# Patient Record
Sex: Female | Born: 1955 | Race: White | Hispanic: No | Marital: Married | Smoking: Former smoker
Health system: Southern US, Community
[De-identification: ages and names within clinical notes are randomized; demographics above are authoritative.]

## PROBLEM LIST (undated history)

## (undated) DIAGNOSIS — G35 Multiple sclerosis: Secondary | ICD-10-CM

## (undated) DIAGNOSIS — N201 Calculus of ureter: Secondary | ICD-10-CM

## (undated) DIAGNOSIS — B953 Streptococcus pneumoniae as the cause of diseases classified elsewhere: Secondary | ICD-10-CM

## (undated) DIAGNOSIS — M810 Age-related osteoporosis without current pathological fracture: Secondary | ICD-10-CM

## (undated) DIAGNOSIS — K219 Gastro-esophageal reflux disease without esophagitis: Secondary | ICD-10-CM

## (undated) DIAGNOSIS — J189 Pneumonia, unspecified organism: Secondary | ICD-10-CM

## (undated) DIAGNOSIS — G8194 Hemiplegia, unspecified affecting left nondominant side: Secondary | ICD-10-CM

## (undated) DIAGNOSIS — M1990 Unspecified osteoarthritis, unspecified site: Secondary | ICD-10-CM

## (undated) DIAGNOSIS — R319 Hematuria, unspecified: Secondary | ICD-10-CM

## (undated) DIAGNOSIS — M5416 Radiculopathy, lumbar region: Secondary | ICD-10-CM

## (undated) DIAGNOSIS — E785 Hyperlipidemia, unspecified: Secondary | ICD-10-CM

## (undated) DIAGNOSIS — I272 Pulmonary hypertension, unspecified: Secondary | ICD-10-CM

## (undated) DIAGNOSIS — H539 Unspecified visual disturbance: Secondary | ICD-10-CM

## (undated) DIAGNOSIS — G4730 Sleep apnea, unspecified: Secondary | ICD-10-CM

## (undated) DIAGNOSIS — F32A Depression, unspecified: Secondary | ICD-10-CM

## (undated) DIAGNOSIS — J449 Chronic obstructive pulmonary disease, unspecified: Secondary | ICD-10-CM

## (undated) DIAGNOSIS — E039 Hypothyroidism, unspecified: Secondary | ICD-10-CM

## (undated) DIAGNOSIS — F329 Major depressive disorder, single episode, unspecified: Secondary | ICD-10-CM

## (undated) DIAGNOSIS — L409 Psoriasis, unspecified: Secondary | ICD-10-CM

## (undated) DIAGNOSIS — E119 Type 2 diabetes mellitus without complications: Secondary | ICD-10-CM

## (undated) DIAGNOSIS — F419 Anxiety disorder, unspecified: Secondary | ICD-10-CM

## (undated) DIAGNOSIS — I10 Essential (primary) hypertension: Secondary | ICD-10-CM

## (undated) DIAGNOSIS — N6019 Diffuse cystic mastopathy of unspecified breast: Secondary | ICD-10-CM

## (undated) HISTORY — PX: JOINT REPLACEMENT: SHX530

## (undated) HISTORY — PX: CARDIAC CATHETERIZATION: SHX172

## (undated) HISTORY — PX: KNEE ARTHROSCOPY: SHX127

## (undated) HISTORY — PX: COLONOSCOPY: SHX174

## (undated) HISTORY — DX: Multiple sclerosis: G35

## (undated) HISTORY — DX: Unspecified osteoarthritis, unspecified site: M19.90

## (undated) HISTORY — DX: Unspecified visual disturbance: H53.9

---
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
## 1978-11-24 DIAGNOSIS — G35 Multiple sclerosis: Secondary | ICD-10-CM

## 1978-11-24 HISTORY — DX: Multiple sclerosis: G35

## 2004-08-28 ENCOUNTER — Encounter: Payer: Self-pay | Admitting: Neurology

## 2004-10-24 ENCOUNTER — Encounter: Payer: Self-pay | Admitting: Neurology

## 2005-02-18 ENCOUNTER — Ambulatory Visit: Payer: Self-pay | Admitting: Internal Medicine

## 2005-06-25 ENCOUNTER — Ambulatory Visit: Payer: Self-pay

## 2005-07-01 ENCOUNTER — Ambulatory Visit: Payer: Self-pay

## 2006-03-12 ENCOUNTER — Ambulatory Visit: Payer: Self-pay | Admitting: Internal Medicine

## 2006-10-08 ENCOUNTER — Ambulatory Visit: Payer: Self-pay | Admitting: Gastroenterology

## 2006-12-08 ENCOUNTER — Encounter: Payer: Self-pay | Admitting: Psychiatry

## 2006-12-25 ENCOUNTER — Encounter: Payer: Self-pay | Admitting: Psychiatry

## 2007-01-23 ENCOUNTER — Encounter: Payer: Self-pay | Admitting: Psychiatry

## 2007-03-16 ENCOUNTER — Encounter: Payer: Self-pay | Admitting: Unknown Physician Specialty

## 2007-03-17 ENCOUNTER — Ambulatory Visit: Payer: Self-pay | Admitting: Internal Medicine

## 2007-03-25 ENCOUNTER — Encounter: Payer: Self-pay | Admitting: Unknown Physician Specialty

## 2007-11-25 HISTORY — PX: REPLACEMENT TOTAL KNEE: SUR1224

## 2008-06-08 ENCOUNTER — Ambulatory Visit: Payer: Self-pay | Admitting: Internal Medicine

## 2008-06-21 ENCOUNTER — Ambulatory Visit: Payer: Self-pay | Admitting: Unknown Physician Specialty

## 2008-06-28 ENCOUNTER — Inpatient Hospital Stay: Payer: Self-pay | Admitting: Unknown Physician Specialty

## 2008-08-22 ENCOUNTER — Encounter: Payer: Self-pay | Admitting: Unknown Physician Specialty

## 2008-08-24 ENCOUNTER — Encounter: Payer: Self-pay | Admitting: Unknown Physician Specialty

## 2008-09-24 ENCOUNTER — Encounter: Payer: Self-pay | Admitting: Unknown Physician Specialty

## 2008-10-24 ENCOUNTER — Encounter: Payer: Self-pay | Admitting: Unknown Physician Specialty

## 2009-07-19 ENCOUNTER — Ambulatory Visit: Payer: Self-pay | Admitting: Internal Medicine

## 2010-01-29 ENCOUNTER — Encounter: Payer: Self-pay | Admitting: Internal Medicine

## 2010-02-22 ENCOUNTER — Encounter: Payer: Self-pay | Admitting: Internal Medicine

## 2010-08-26 ENCOUNTER — Ambulatory Visit: Payer: Self-pay | Admitting: Internal Medicine

## 2011-05-13 ENCOUNTER — Ambulatory Visit: Payer: Self-pay | Admitting: Neurology

## 2011-08-28 ENCOUNTER — Ambulatory Visit: Payer: Self-pay | Admitting: Internal Medicine

## 2012-08-24 ENCOUNTER — Ambulatory Visit: Payer: Self-pay | Admitting: Neurology

## 2012-09-27 ENCOUNTER — Ambulatory Visit: Payer: Self-pay | Admitting: Internal Medicine

## 2013-09-28 ENCOUNTER — Ambulatory Visit: Payer: Self-pay | Admitting: Internal Medicine

## 2014-10-02 ENCOUNTER — Ambulatory Visit: Payer: Self-pay | Admitting: Internal Medicine

## 2015-01-02 DIAGNOSIS — F329 Major depressive disorder, single episode, unspecified: Secondary | ICD-10-CM | POA: Insufficient documentation

## 2015-01-02 DIAGNOSIS — F419 Anxiety disorder, unspecified: Secondary | ICD-10-CM | POA: Insufficient documentation

## 2015-01-02 DIAGNOSIS — F17200 Nicotine dependence, unspecified, uncomplicated: Secondary | ICD-10-CM | POA: Insufficient documentation

## 2015-01-02 DIAGNOSIS — F418 Other specified anxiety disorders: Secondary | ICD-10-CM | POA: Insufficient documentation

## 2015-01-02 DIAGNOSIS — Z9109 Other allergy status, other than to drugs and biological substances: Secondary | ICD-10-CM | POA: Insufficient documentation

## 2015-01-02 DIAGNOSIS — E669 Obesity, unspecified: Secondary | ICD-10-CM | POA: Insufficient documentation

## 2015-02-27 DIAGNOSIS — I10 Essential (primary) hypertension: Secondary | ICD-10-CM | POA: Insufficient documentation

## 2015-03-02 ENCOUNTER — Ambulatory Visit
Admit: 2015-03-02 | Disposition: A | Discharge status: Home / Self Care | Payer: Self-pay | Attending: Internal Medicine | Admitting: Internal Medicine

## 2015-08-24 ENCOUNTER — Other Ambulatory Visit: Payer: Self-pay | Admitting: Internal Medicine

## 2015-08-24 ENCOUNTER — Other Ambulatory Visit: Payer: Self-pay | Admitting: *Deleted

## 2015-08-24 DIAGNOSIS — Z1231 Encounter for screening mammogram for malignant neoplasm of breast: Secondary | ICD-10-CM

## 2015-10-04 ENCOUNTER — Other Ambulatory Visit: Payer: Self-pay | Admitting: Internal Medicine

## 2015-10-04 ENCOUNTER — Ambulatory Visit
Admission: RE | Admit: 2015-10-04 | Discharge: 2015-10-04 | Disposition: A | Discharge status: Home / Self Care | Payer: Medicare Other | Source: Ambulatory Visit | Attending: Internal Medicine | Admitting: Internal Medicine

## 2015-10-04 DIAGNOSIS — Z1231 Encounter for screening mammogram for malignant neoplasm of breast: Secondary | ICD-10-CM | POA: Insufficient documentation

## 2016-01-15 ENCOUNTER — Ambulatory Visit: Payer: Medicare Other | Attending: Neurology

## 2016-01-15 DIAGNOSIS — G4733 Obstructive sleep apnea (adult) (pediatric): Secondary | ICD-10-CM | POA: Diagnosis not present

## 2016-05-01 ENCOUNTER — Other Ambulatory Visit: Payer: Self-pay | Admitting: Internal Medicine

## 2016-05-01 ENCOUNTER — Ambulatory Visit: Payer: Medicare Other | Admitting: Physical Therapy

## 2016-05-01 DIAGNOSIS — E119 Type 2 diabetes mellitus without complications: Secondary | ICD-10-CM | POA: Insufficient documentation

## 2016-05-01 DIAGNOSIS — G35 Multiple sclerosis: Secondary | ICD-10-CM

## 2016-05-06 ENCOUNTER — Encounter: Payer: Medicare Other | Admitting: Physical Therapy

## 2016-05-06 ENCOUNTER — Encounter: Payer: Self-pay | Admitting: Physical Therapy

## 2016-05-06 ENCOUNTER — Ambulatory Visit: Payer: Medicare Other | Attending: Orthopedic Surgery | Admitting: Physical Therapy

## 2016-05-06 DIAGNOSIS — M25511 Pain in right shoulder: Secondary | ICD-10-CM

## 2016-05-06 DIAGNOSIS — R262 Difficulty in walking, not elsewhere classified: Secondary | ICD-10-CM | POA: Insufficient documentation

## 2016-05-06 DIAGNOSIS — M6281 Muscle weakness (generalized): Secondary | ICD-10-CM | POA: Diagnosis present

## 2016-05-06 DIAGNOSIS — G35 Multiple sclerosis: Secondary | ICD-10-CM | POA: Insufficient documentation
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
## 2016-05-08 ENCOUNTER — Ambulatory Visit: Payer: Medicare Other | Admitting: Physical Therapy

## 2016-05-08 DIAGNOSIS — M25511 Pain in right shoulder: Secondary | ICD-10-CM | POA: Diagnosis not present

## 2016-05-08 DIAGNOSIS — M6281 Muscle weakness (generalized): Secondary | ICD-10-CM
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
## 2016-05-13 ENCOUNTER — Ambulatory Visit: Payer: Medicare Other

## 2016-05-13 ENCOUNTER — Encounter: Payer: Self-pay | Admitting: Physical Therapy

## 2016-05-13 DIAGNOSIS — M25511 Pain in right shoulder: Secondary | ICD-10-CM

## 2016-05-13 DIAGNOSIS — M6281 Muscle weakness (generalized): Secondary | ICD-10-CM
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
## 2016-05-15 ENCOUNTER — Ambulatory Visit: Payer: Medicare Other

## 2016-05-15 DIAGNOSIS — M25511 Pain in right shoulder: Secondary | ICD-10-CM | POA: Diagnosis not present

## 2016-05-15 DIAGNOSIS — M6281 Muscle weakness (generalized): Secondary | ICD-10-CM
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
## 2016-05-20 ENCOUNTER — Ambulatory Visit: Payer: Medicare Other | Admitting: Physical Therapy

## 2016-05-22 ENCOUNTER — Ambulatory Visit: Payer: Medicare Other | Admitting: Physical Therapy

## 2016-05-22 DIAGNOSIS — M25511 Pain in right shoulder: Secondary | ICD-10-CM
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
## 2016-06-03 ENCOUNTER — Ambulatory Visit
Admission: RE | Admit: 2016-06-03 | Discharge: 2016-06-03 | Disposition: A | Discharge status: Home / Self Care | Payer: Medicare Other | Source: Ambulatory Visit | Attending: Internal Medicine | Admitting: Internal Medicine

## 2016-06-03 DIAGNOSIS — G35 Multiple sclerosis: Secondary | ICD-10-CM

## 2016-06-03 MED ORDER — GADOBENATE DIMEGLUMINE 529 MG/ML IV SOLN
20.0000 mL | Freq: Once | INTRAVENOUS | Status: AC | PRN
  Administered 2016-06-03: 19 mL via INTRAVENOUS

## 2016-07-13 ENCOUNTER — Emergency Department: Payer: Medicare Other

## 2016-07-13 ENCOUNTER — Encounter: Payer: Self-pay | Admitting: Emergency Medicine

## 2016-07-13 ENCOUNTER — Emergency Department
Admission: EM | Admit: 2016-07-13 | Discharge: 2016-07-13 | Disposition: A | Discharge status: Home / Self Care | Payer: Medicare Other | Attending: Emergency Medicine | Admitting: Emergency Medicine

## 2016-07-13 DIAGNOSIS — Y939 Activity, unspecified: Secondary | ICD-10-CM | POA: Insufficient documentation

## 2016-07-13 DIAGNOSIS — S2232XA Fracture of one rib, left side, initial encounter for closed fracture: Secondary | ICD-10-CM | POA: Diagnosis not present

## 2016-07-13 DIAGNOSIS — Y999 Unspecified external cause status: Secondary | ICD-10-CM | POA: Insufficient documentation

## 2016-07-13 DIAGNOSIS — F17210 Nicotine dependence, cigarettes, uncomplicated: Secondary | ICD-10-CM | POA: Diagnosis not present

## 2016-07-13 DIAGNOSIS — Y929 Unspecified place or not applicable: Secondary | ICD-10-CM | POA: Insufficient documentation

## 2016-07-13 DIAGNOSIS — S299XXA Unspecified injury of thorax, initial encounter: Secondary | ICD-10-CM | POA: Diagnosis present

## 2016-07-13 DIAGNOSIS — W1839XA Other fall on same level, initial encounter: Secondary | ICD-10-CM | POA: Diagnosis not present

## 2016-07-13 HISTORY — DX: Age-related osteoporosis without current pathological fracture: M81.0

## 2016-07-13 NOTE — ED Triage Notes (Signed)
Patient has history of MS and Friday evening patient fell onto metal bar to left ribs / left upper abd and then onto knees.  Patient c/o left rib and side pain with movement and left knee pain.

## 2016-07-13 NOTE — Discharge Instructions (Signed)
Follow-up with Dr. [name] if any continued problems. Begin taking your medication at home as directed

## 2016-07-13 NOTE — ED Provider Notes (Signed)
[facility] Emergency Department Provider Note   ____________________________________________   First MD Initiated Contact with Patient 07/13/16 [phone]     (approximate)  I have reviewed the triage vital signs and the nursing notes.   HISTORY  Chief Complaint Fall   HPI Ashlee Fields is a 60 y.o. female is here with complaint of left rib pain after losing her balance and falling one day ago.Patient states she has a history of MS and lost her balance which she does frequently and hit a metal bar with her left ribs. Patient then went down on her knees however she is not complaining of any knee pain at this time. Patient has continued to ambulate. She states that she took baclofen for spasms and had one Tylenol with codeine left over from a dental procedure. Patient states the Tylenol with codeine did not help with her pain. Today she continues to have pain in the same area. This pain is increased with certain movements or coughing. She denies any head injury or loss of consciousness. She denies any back pain.   Past Medical History:  Diagnosis Date  . Arthritis   . Multiple sclerosis ([place]) 1980  . Osteoporosis     Patient Active Problem List   Diagnosis Date Noted  . MS (multiple sclerosis) ([place]) 05/06/2016    Past Surgical History:  Procedure Laterality Date  . CESAREAN SECTION    . JOINT REPLACEMENT      Prior to Admission medications   Medication Sig Start Date End Date Taking? Authorizing Provider  amLODipine (NORVASC) 5 MG tablet Take 5 mg by mouth daily.    Historical Provider, MD  baclofen (LIORESAL) 20 MG tablet Take 20 mg by mouth 3 (three) times daily.    Historical Provider, MD  benazepril-hydrochlorthiazide (LOTENSIN HCT) 20-25 MG tablet Take 1 tablet by mouth daily.    Historical Provider, MD  FLUoxetine (PROZAC) 40 MG capsule Take 40 mg by mouth daily.    Historical Provider, MD  levothyroxine (SYNTHROID, LEVOTHROID) 50 MCG tablet  Take 50 mcg by mouth daily before breakfast.    Historical Provider, MD  pantoprazole (PROTONIX) 40 MG tablet Take 40 mg by mouth daily.    Historical Provider, MD    Allergies Penicillins and Sulfa antibiotics  Family History  Problem Relation Age of Onset  . Breast cancer Paternal Grandmother     Social History Social History  Substance Use Topics  . Smoking status: Current Every Day Smoker    Packs/day: 0.50    Types: Cigarettes  . Smokeless tobacco: Never Used  . Alcohol use Not on file    Review of Systems Constitutional: No fever/chills Eyes: No visual changes. ENT: No trauma Cardiovascular: Denies chest pain. Respiratory: Denies shortness of breath. Increased rib pain with deep breaths. Gastrointestinal: No abdominal pain.  No nausea, no vomiting.   Genitourinary: Negative for dysuria. Musculoskeletal: Negative for back pain. Positive left lateral rib pain. Skin: Negative for rash. Neurological: Negative for headaches, focal weakness or numbness.  10-point ROS otherwise negative.  ____________________________________________   PHYSICAL EXAM:  VITAL SIGNS: ED Triage Vitals  Enc Vitals Group     BP 07/13/16 0930 126/64     Pulse Rate 07/13/16 0930 72     Resp 07/13/16 0930 16     Temp 07/13/16 0930 97.8 F (36.6 C)     Temp Source 07/13/16 0930 Oral     SpO2 07/13/16 0930 94 %     Weight 07/13/16 0930  200 lb (90.7 kg)     Height 07/13/16 0930 _0  (1.6 m)     Head Circumference --      Peak Flow --      Pain Score 07/13/16 0937 0     Pain Loc --      Pain Edu? --      Excl. in Center? --     Constitutional: Alert and oriented. Well appearing and in no acute distress. Eyes: Conjunctivae are normal. PERRL. EOMI. Head: Atraumatic. Nose: No congestion/rhinnorhea. Mouth/Throat: Mucous membranes are moist.  Oropharynx non-erythematous. Neck: No stridor.  Cardiovascular: Normal rate, regular rhythm. Grossly normal heart sounds.  Good peripheral  circulation. Respiratory: Normal respiratory effort.  No retractions. Lungs CTAB. No deformities noted on the left lateral rib area or soft tissue swelling. No ecchymosis was noted. No crepitus. Gastrointestinal: Soft. Left lateral soft tissue tenderness without edema. No distention. No CVA tenderness. Musculoskeletal: No lower extremity tenderness nor edema.  No joint effusions. Neurologic:  Normal speech and language. No gross focal neurologic deficits are appreciated. No gait instability. Skin:  Skin is warm, dry and intact. No rash noted. Psychiatric: Mood and affect are normal. Speech and behavior are normal.  ____________________________________________   LABS (all labs ordered are listed, but only abnormal results are displayed)  Labs Reviewed - No data to display  RADIOLOGY  Left rib x-ray per radiologist Minimal displaced fracture of the left tenth rib. No pneumothorax.  I, [name], personally viewed and evaluated these images (plain radiographs) as part of my medical decision making, as well as reviewing the written report by the radiologist. ____________________________________________   PROCEDURES  Procedure(s) performed: None  Procedures  Critical Care performed: No  ____________________________________________   INITIAL IMPRESSION / ASSESSMENT AND PLAN / ED COURSE  Pertinent labs & imaging results that were available during my care of the patient were reviewed by me and considered in my medical decision making (see chart for details).    Clinical Course   She was made aware of her fractured rib. Patient states that narcotics generally do not give her any pain relief and that she does better with prednisone 10 mg daily as she takes for her MS. She will notify her PCP that she is once again taking prednisone. Patient states she has medication at home already. Patient is follow-up with her PCP if any continued  problems.  ____________________________________________   FINAL CLINICAL IMPRESSION(S) / ED DIAGNOSES  Final diagnoses:  Rib fracture, left, closed, initial encounter      NEW MEDICATIONS STARTED DURING THIS VISIT:  Discharge Medication List as of 07/13/2016 11:02 AM       Note:  This document was prepared using Dragon voice recognition software and may include unintentional dictation errors.    [name], PA-C 07/13/16 1617    Earleen Newport, MD 07/14/16 [phone]

## 2016-08-06 ENCOUNTER — Other Ambulatory Visit: Payer: Self-pay | Admitting: Internal Medicine

## 2016-08-06 DIAGNOSIS — Z1231 Encounter for screening mammogram for malignant neoplasm of breast: Secondary | ICD-10-CM

## 2016-10-07 ENCOUNTER — Encounter: Payer: Self-pay | Admitting: Neurology

## 2016-10-07 ENCOUNTER — Encounter: Payer: Self-pay | Admitting: *Deleted

## 2016-10-07 ENCOUNTER — Ambulatory Visit (INDEPENDENT_AMBULATORY_CARE_PROVIDER_SITE_OTHER): Payer: Medicare Other | Admitting: Neurology

## 2016-10-07 VITALS — BP 124/72 | HR 70 | Resp 18 | Ht 63.0 in | Wt 209.5 lb

## 2016-10-07 DIAGNOSIS — R5383 Other fatigue: Secondary | ICD-10-CM | POA: Diagnosis not present

## 2016-10-07 DIAGNOSIS — G35 Multiple sclerosis: Secondary | ICD-10-CM | POA: Diagnosis not present

## 2016-10-07 DIAGNOSIS — J449 Chronic obstructive pulmonary disease, unspecified: Secondary | ICD-10-CM | POA: Insufficient documentation

## 2016-10-07 DIAGNOSIS — E669 Obesity, unspecified: Secondary | ICD-10-CM | POA: Insufficient documentation

## 2016-10-07 DIAGNOSIS — F32A Depression, unspecified: Secondary | ICD-10-CM

## 2016-10-07 DIAGNOSIS — F329 Major depressive disorder, single episode, unspecified: Secondary | ICD-10-CM

## 2016-10-07 DIAGNOSIS — R269 Unspecified abnormalities of gait and mobility: Secondary | ICD-10-CM

## 2016-10-07 DIAGNOSIS — E785 Hyperlipidemia, unspecified: Secondary | ICD-10-CM | POA: Insufficient documentation

## 2016-10-07 DIAGNOSIS — Z79899 Other long term (current) drug therapy: Secondary | ICD-10-CM

## 2016-10-07 DIAGNOSIS — N6019 Diffuse cystic mastopathy of unspecified breast: Secondary | ICD-10-CM | POA: Insufficient documentation

## 2016-10-07 DIAGNOSIS — G8194 Hemiplegia, unspecified affecting left nondominant side: Secondary | ICD-10-CM

## 2016-10-07 DIAGNOSIS — M1990 Unspecified osteoarthritis, unspecified site: Secondary | ICD-10-CM | POA: Insufficient documentation

## 2016-10-07 DIAGNOSIS — J441 Chronic obstructive pulmonary disease with (acute) exacerbation: Secondary | ICD-10-CM | POA: Insufficient documentation

## 2016-10-07 MED ORDER — METHYLPHENIDATE HCL 10 MG PO TABS: 10.0000 mg | ORAL_TABLET | Freq: Two times a day (BID) | ORAL | 0 refills | Status: DC

## 2016-10-07 NOTE — Progress Notes (Signed)
[facility]  PATIENT: Ashlee Fields DOB: 1956/07/11  REFERRING DOCTOR OR PCP:  Ashlee Fields (PCP) and Ashlee Fields (Neuro) SOURCE: Patient, notes from Ashlee Fields, Imaging/lab reports, sleep study reports, MRI images on PACS personally reviewed  _________________________________   HISTORICAL  CHIEF COMPLAINT:  Chief Complaint  Patient presents with  . Multiple Sclerosis    Ashlee Fields is here with her husband Ashlee Fields to discuss tx. options for MS.  Sts. she was dx. in 1980.  Presenting sx. was numbness bilat arms/hands, left leg numbness. Sts. dx. confirmed with MRI and LP.  She saw Ashlee Fields in [place].  She started Avonex but stopped 10 yrs. later due to inj. site fatigue and progression of sx.  She then tried Betaseron and Copaxone but stopped those due to progression of sx., inj. site fatigue.  She then tried Tecfidera but stopped due to side effects (flushing),  Sts. at   . Gait Disturbance    that time, Ashlee Fields discussed the possibility that her MS is progressive.  She has left sided weakness, gait disturbance.  Ampyra did not help.  She is here today to discuss Ocrelizumab/fim    HISTORY OF PRESENT ILLNESS:  I had the pleasure seeing you patient, Ashlee Fields, at [facility] for neurologic consultation regarding her multiple sclerosis and to get an opinion about disease modifying therapies.  MS history: About 40 years ago, she had tenderness in both hands that lasted for about a week. Her symptoms then in improved. Later, after an exercise workout, she had the onset of left leg numbness that persisted for about a week. Both times she got 100% better.   She was diagnosed with MS in 1980 after having MRI and lumbar puncture. She was not on any treatment for many years. In the mid 1990s she started Avonex. At different times she was also tried on Betaseron and Copaxone. Due to combinations of progression of her symptoms as well as injection site  fatigue, she was placed on Tecfidera a few years ago. That was stopped due to flushing as a side effect. She then has been off any medicines for the past couple of years.  Ampyra was tried for her gait but did not help.  Gait/strength/sensation: Currently, she has difficulty with her walking due to spasticity in the left greater than right leg, mild weakness and poor balance. She uses a cane or her husband for balance. She notes that there is weakness that is worse at the ankle on the left and she has an AFO. She does not note any asymmetry in her sensation.    She notes that her spasticity is a little improved with baclofen and she takes it mostly at that time. She also finds that her spasticity improves with steroids though she has been on oral pills for a long time.  Vision: Over the past year she has noted more difficulty with blurry vision. This appears to be bilateral although the left eye seems worse than the right eye. Denies any double vision. Color vision is symmetric.  Bladder: She has urinary urgency and some frequency. If she cannot get to the bathroom in time she may have incontinence.  Fatigue/sleep: She reports both physical and cognitive fatigue. She finds the most benefit from prednisone and she is currently on 10 mg daily. A couple attempts have been made to taper the prednisone but she felt much worse with her fatigue and was reinstated. In the past she has tried Provigil  and Nuvigil but had problems with her heart racing. Sudafed can help her fatigue little bit. She falls asleep and stays asleep well. She takes Valium and an baclofen at bedtime which helps her insomnia. She also treat obstructive sleep apnea with CPAP. She has had restless legs which is helped by the Valium.  Mood/cognition: She has had some mild mood disturbances and is on Prozac with benefit. She denies any major cognitive problems that does have some decreased focus and attention at times.  I personally reviewed  the MRIs of the brain dated 06/03/2016 and 08/24/2012 and the MRI of the cervical spine dated 08/24/2012. The MRI of the cervical spine shows an anterior lesion adjacent to C6 consistent with MS. Additionally, on the axial T2-weighted images of the brain there appears to be a focus at the cervicomedullary junction. The MRI of the brain shows multiple T2/FLAIR hyperintense foci in the hemispheres with MS though many are nonspecific. During the 4 years between the 2 MRI studies there is one more T2/FLAIR hyperintense focus in the brain.  REVIEW OF SYSTEMS: Constitutional: No fevers, chills, sweats, or change in appetite Eyes: No visual changes, double vision, eye pain Ear, nose and throat: No hearing loss, ear pain, nasal congestion, sore throat Cardiovascular: No chest pain, palpitations Respiratory: No shortness of breath at rest or with exertion.   No wheezes GastrointestinaI: No nausea, vomiting, diarrhea, abdominal pain, fecal incontinence Genitourinary: No dysuria, urinary retention or frequency.  No nocturia. Musculoskeletal: No neck pain, back pain Integumentary: No rash, pruritus, skin lesions Neurological: as above Psychiatric: No depression at this time.  No anxiety Endocrine: No palpitations, diaphoresis, change in appetite, change in weigh or increased thirst Hematologic/Lymphatic: No anemia, purpura, petechiae. Allergic/Immunologic: No itchy/runny eyes, nasal congestion, recent allergic reactions, rashes  ALLERGIES: Allergies  Allergen Reactions  . Penicillins   . Sulfa Antibiotics     HOME MEDICATIONS:  Current Outpatient Prescriptions:  .  amLODipine (NORVASC) 5 MG tablet, Take 5 mg by mouth daily., Disp: , Rfl:  .  baclofen (LIORESAL) 20 MG tablet, Take 20 mg by mouth 3 (three) times daily., Disp: , Rfl:  .  levothyroxine (SYNTHROID, LEVOTHROID) 50 MCG tablet, Take 50 mcg by mouth daily before breakfast., Disp: , Rfl:  .  losartan-hydrochlorothiazide (HYZAAR)  100-12.5 MG tablet, Take 1 tablet by mouth daily., Disp: , Rfl:  .  pantoprazole (PROTONIX) 40 MG tablet, Take 40 mg by mouth daily., Disp: , Rfl:   PAST MEDICAL HISTORY: Past Medical History:  Diagnosis Date  . Arthritis   . Multiple sclerosis ([place]) 1980  . Osteoporosis   . Vision abnormalities     PAST SURGICAL HISTORY: Past Surgical History:  Procedure Laterality Date  . CESAREAN SECTION    . JOINT REPLACEMENT      FAMILY HISTORY: Family History  Problem Relation Age of Onset  . Breast cancer Paternal Grandmother   . Heart disease Mother   . Kidney failure Father     SOCIAL HISTORY:  Social History   Social History  . Marital status: Married    Spouse name: N/A  . Number of children: N/A  . Years of education: N/A   Occupational History  . Not on file.   Social History Main Topics  . Smoking status: Current Every Day Smoker    Packs/day: 0.50    Types: Cigarettes  . Smokeless tobacco: Never Used  . Alcohol use Not on file  . Drug use: Unknown  . Sexual activity: Not on  file   Other Topics Concern  . Not on file   Social History Narrative  . No narrative on file     PHYSICAL EXAM  Vitals:   10/07/16 1519  BP: 124/72  Fields: 70  Resp: 18  Weight: 209 lb 8 oz (95 kg)  Height: _0  (1.6 m)    Body mass index is 37.11 kg/m.   General: The patient is well-developed and well-nourished and in no acute distress  Eyes:  Funduscopic exam shows normal optic discs and retinal vessels.  Neck: The neck is supple, no carotid bruits are noted.  The neck is nontender.  Cardiovascular: The heart has a regular rate and rhythm with a normal S1 and S2. There were no murmurs, gallops or rubs. Lungs are clear to auscultation.  Skin: Extremities are without significant edema.  Musculoskeletal:  Back is nontender  Neurologic Exam  Mental status: The patient is alert and oriented x 3 at the time of the examination. The patient has apparent normal recent  and remote memory, with an apparently normal attention span and concentration ability.   Speech is normal.  Cranial nerves: Extraocular movements are full. Pupils are equal, round, and reactive to light and accomodation.  Visual fields are full.  Facial symmetry is present. There is good facial sensation to soft touch bilaterally.Facial strength is normal.  Trapezius and sternocleidomastoid strength is normal. No dysarthria is noted.  The tongue is midline, and the patient has symmetric elevation of the soft palate. No obvious hearing deficits are noted.  Motor:  Muscle bulk is normal.   Tone is increased on the left arm and leg. Strength is 4+ to 5/5 in the left arm and 4-/5 in the left  iliopsoas 4/5 in the left quads and 3/5 at the left ankle  Sensory: Sensory testing is intact to pinprick, soft touch and vibration sensation in all 4 extremities.  Coordination: Cerebellar testing reveals good finger-nose-finger and heel-to-shin bilaterally.  Gait and station: Station is normal.   Gait is spastic with a left foot drop (wears AFO). Stride is shortened. Romberg is negative.   Reflexes: Deep tendon reflexes are increased in the left arm and leg. There is clonus at the ankle on the left. Plantar response is extensor on the left     DIAGNOSTIC DATA (LABS, IMAGING, TESTING) - I reviewed patient records, labs, notes, testing and imaging myself where available.       ASSESSMENT AND PLAN  *MS (multiple sclerosis) ([place]) - Plan: Hepatitis B surface antigen, Hepatitis B core antibody, total, CBC with Differential/Platelet, Quantiferon tb gold assay (blood), Hepatitis B surface antibody, Hepatic function panel  Gait disturbance  Other fatigue  Left hemiplegia (HCC)  Depression, unspecified depression type  High risk medication use - Plan: Hepatitis B surface antigen, Hepatitis B core antibody, total, Quantiferon tb gold assay (blood), Hepatitis B surface antibody   1.   Ashlee Fields has  relapsing remitting multiple sclerosis and more recently has had progression of older symptoms (left hemiplegia) and onset of newer symptoms (vision changes) over the last year.    She went off of disease modifying therapies a couple years ago but would like to reconsider starting an agent. We discussed ocrelizumab and went over the risks and benefits/  we will check labwork to rule out hepatitis B and latent tuberculosis.   We discussed that I would her her to try to taper the prednisone or further testing if she goes on ocrelizumab. 2.  One of  her major symptoms is fatigue and additionally she has OSA but still has daytime sleepiness. I will have her try Ritalin 10 mg by mouth every morning and she can take a second tablet 3-4 hours later.  Hopefully this will help her fatigue and that might allow her to get off of the prednisone 3.   Her main problem is spasticity on the left side of her body. Marland Kitchen  She will continue baclofen for her spasticity and other medications.  4.   She will return to see me in a couple of months but call sooner if there are new or worsening neurologic symptoms.  Thank you for asking me to see Ashlee Fields for a neurologic consultation. Please let me know if I can be of further assistance with her or other patients in the future.   Ashlee Defeo A. Felecia Shelling, MD, PhD 08/67/6195, 0:93 PM Certified in Neurology, Clinical Neurophysiology, Sleep Medicine, Pain Medicine and Neuroimaging  [facility] [address] [place], [place] [address] [phone]

## 2016-10-08 LAB — HEPATITIS B CORE ANTIBODY, TOTAL: Hep B Core Total Ab: NEGATIVE

## 2016-10-08 LAB — CBC WITH DIFFERENTIAL/PLATELET
Basophils Absolute: 0 10*3/uL (ref 0.0–0.2)
Basos: 0 %
EOS (ABSOLUTE): 0 10*3/uL (ref 0.0–0.4)
Eos: 0 %
Hematocrit: 38.8 % (ref 34.0–46.6)
Hemoglobin: 13.3 g/dL (ref 11.1–15.9)
Immature Grans (Abs): 0 10*3/uL (ref 0.0–0.1)
Immature Granulocytes: 0 %
Lymphocytes Absolute: 1.1 10*3/uL (ref 0.7–3.1)
Lymphs: 10 %
MCH: 30.4 pg (ref 26.6–33.0)
MCHC: 34.3 g/dL (ref 31.5–35.7)
MCV: 89 fL (ref 79–97)
Monocytes Absolute: 0.5 10*3/uL (ref 0.1–0.9)
Monocytes: 4 %
Neutrophils Absolute: 9.3 10*3/uL — ABNORMAL HIGH (ref 1.4–7.0)
Neutrophils: 86 %
Platelets: 340 10*3/uL (ref 150–379)
RBC: 4.37 x10E6/uL (ref 3.77–5.28)
RDW: 16 % — ABNORMAL HIGH (ref 12.3–15.4)
WBC: 11 10*3/uL — ABNORMAL HIGH (ref 3.4–10.8)

## 2016-10-08 LAB — HEPATIC FUNCTION PANEL
ALT: 31 IU/L (ref 0–32)
AST: 21 IU/L (ref 0–40)
Albumin: 4.7 g/dL (ref 3.6–4.8)
Alkaline Phosphatase: 88 IU/L (ref 39–117)
Bilirubin Total: 0.6 mg/dL (ref 0.0–1.2)
Bilirubin, Direct: 0.17 mg/dL (ref 0.00–0.40)
Total Protein: 7 g/dL (ref 6.0–8.5)

## 2016-10-08 LAB — HEPATITIS B SURFACE ANTIGEN: Hepatitis B Surface Ag: NEGATIVE

## 2016-10-08 LAB — HEPATITIS B SURFACE ANTIBODY,QUALITATIVE: Hep B Surface Ab, Qual: NONREACTIVE

## 2016-10-09 ENCOUNTER — Ambulatory Visit
Admission: RE | Admit: 2016-10-09 | Discharge: 2016-10-09 | Disposition: A | Discharge status: Home / Self Care | Payer: Medicare Other | Source: Ambulatory Visit | Attending: Internal Medicine | Admitting: Internal Medicine

## 2016-10-09 DIAGNOSIS — Z1231 Encounter for screening mammogram for malignant neoplasm of breast: Secondary | ICD-10-CM

## 2016-10-09 LAB — QUANTIFERON IN TUBE
QFT TB AG MINUS NIL VALUE: 0 IU/mL
QUANTIFERON MITOGEN VALUE: 6.27 IU/mL
QUANTIFERON TB AG VALUE: 0.05 IU/mL
QUANTIFERON TB GOLD: NEGATIVE
Quantiferon Nil Value: 0.05 IU/mL

## 2016-10-09 LAB — QUANTIFERON TB GOLD ASSAY (BLOOD)

## 2016-10-13 ENCOUNTER — Telehealth: Payer: Self-pay | Admitting: *Deleted

## 2016-10-13 NOTE — Telephone Encounter (Signed)
-----  Message from Britt Bottom, MD sent at 10/10/2016  9:01 AM EST ----- Her lab work looks good. We can send in the ocrelizumab form.

## 2016-10-13 NOTE — Telephone Encounter (Signed)
Noted/fim

## 2016-10-13 NOTE — Telephone Encounter (Signed)
I have spoken with Ashlee Fields this afternoon and per RAS, advised that labs are ok for Ocrelizumab.  She verbalized understanding of same, sts. her ins. is changing 10-24-16.  As she will not be able to be infused prior to 12-1, will hold off on sending srf in until we receive new ins. info. Also, she  sts she forgot to mention to RAS that she has a hx. of severe psoriasis, now cleared up with course of meds.  Wants to make sure it is still ok to start Ocrelizumab.  I will check with him tomorrow when he returns to the office and call pt. back.  Also, pt. sts. Ritalin 50m once daily does not  help with fatigue.  As RAS gave rx. for #60, one bid, I have advised her to repeat Ritalin 173mat lunch.  She verbalized understanding of same./fim

## 2016-10-13 NOTE — Telephone Encounter (Signed)
Pt called to speak with RN. She advised Rn needed insurance phone #:  Deatra Canter [phone]  ID# [id number]

## 2016-10-14 NOTE — Telephone Encounter (Signed)
I have spoken with Jaclyn Shaggy this morning, and per RAS, advised it is ok for her to start Ocrelizumab, with hx. of psoriasis/fim

## 2016-10-31 DIAGNOSIS — G4733 Obstructive sleep apnea (adult) (pediatric): Secondary | ICD-10-CM | POA: Diagnosis not present

## 2016-11-25 DIAGNOSIS — H538 Other visual disturbances: Secondary | ICD-10-CM | POA: Diagnosis not present

## 2016-11-25 DIAGNOSIS — E7800 Pure hypercholesterolemia, unspecified: Secondary | ICD-10-CM | POA: Diagnosis not present

## 2016-11-25 DIAGNOSIS — I10 Essential (primary) hypertension: Secondary | ICD-10-CM | POA: Diagnosis not present

## 2016-11-25 DIAGNOSIS — H539 Unspecified visual disturbance: Secondary | ICD-10-CM | POA: Diagnosis not present

## 2016-11-25 DIAGNOSIS — E039 Hypothyroidism, unspecified: Secondary | ICD-10-CM | POA: Diagnosis not present

## 2016-11-25 DIAGNOSIS — R0602 Shortness of breath: Secondary | ICD-10-CM | POA: Diagnosis not present

## 2016-11-25 DIAGNOSIS — G4733 Obstructive sleep apnea (adult) (pediatric): Secondary | ICD-10-CM | POA: Diagnosis not present

## 2016-11-25 DIAGNOSIS — G35 Multiple sclerosis: Secondary | ICD-10-CM | POA: Diagnosis not present

## 2016-11-25 DIAGNOSIS — E119 Type 2 diabetes mellitus without complications: Secondary | ICD-10-CM | POA: Diagnosis not present

## 2016-11-25 DIAGNOSIS — Z79899 Other long term (current) drug therapy: Secondary | ICD-10-CM | POA: Diagnosis not present

## 2016-11-25 DIAGNOSIS — R110 Nausea: Secondary | ICD-10-CM | POA: Diagnosis not present

## 2016-11-25 DIAGNOSIS — Z1211 Encounter for screening for malignant neoplasm of colon: Secondary | ICD-10-CM | POA: Diagnosis not present

## 2016-11-25 DIAGNOSIS — J431 Panlobular emphysema: Secondary | ICD-10-CM | POA: Diagnosis not present

## 2016-12-01 DIAGNOSIS — G4733 Obstructive sleep apnea (adult) (pediatric): Secondary | ICD-10-CM | POA: Diagnosis not present

## 2016-12-16 DIAGNOSIS — G35 Multiple sclerosis: Secondary | ICD-10-CM | POA: Diagnosis not present

## 2016-12-30 DIAGNOSIS — G35 Multiple sclerosis: Secondary | ICD-10-CM | POA: Diagnosis not present

## 2017-01-01 DIAGNOSIS — R1013 Epigastric pain: Secondary | ICD-10-CM | POA: Diagnosis not present

## 2017-01-01 DIAGNOSIS — Z1211 Encounter for screening for malignant neoplasm of colon: Secondary | ICD-10-CM | POA: Diagnosis not present

## 2017-01-01 DIAGNOSIS — G4733 Obstructive sleep apnea (adult) (pediatric): Secondary | ICD-10-CM | POA: Diagnosis not present

## 2017-01-22 ENCOUNTER — Ambulatory Visit
Admission: RE | Admit: 2017-01-22 | Discharge: 2017-01-22 | Disposition: A | Discharge status: Home / Self Care | Payer: PPO | Source: Ambulatory Visit | Attending: Internal Medicine | Admitting: Internal Medicine

## 2017-01-22 ENCOUNTER — Other Ambulatory Visit: Payer: Self-pay | Admitting: Internal Medicine

## 2017-01-22 DIAGNOSIS — R071 Chest pain on breathing: Secondary | ICD-10-CM | POA: Diagnosis not present

## 2017-01-22 DIAGNOSIS — Z79899 Other long term (current) drug therapy: Secondary | ICD-10-CM | POA: Diagnosis not present

## 2017-01-22 DIAGNOSIS — G35 Multiple sclerosis: Secondary | ICD-10-CM | POA: Diagnosis not present

## 2017-01-22 DIAGNOSIS — E119 Type 2 diabetes mellitus without complications: Secondary | ICD-10-CM | POA: Diagnosis not present

## 2017-01-22 DIAGNOSIS — I10 Essential (primary) hypertension: Secondary | ICD-10-CM | POA: Diagnosis not present

## 2017-01-22 DIAGNOSIS — R0602 Shortness of breath: Secondary | ICD-10-CM | POA: Diagnosis not present

## 2017-01-22 DIAGNOSIS — E039 Hypothyroidism, unspecified: Secondary | ICD-10-CM | POA: Diagnosis not present

## 2017-01-22 LAB — POCT I-STAT CREATININE: Creatinine, Ser: 0.8 mg/dL (ref 0.44–1.00)

## 2017-01-22 MED ORDER — IOPAMIDOL (ISOVUE-370) INJECTION 76%
75.0000 mL | Freq: Once | INTRAVENOUS | Status: AC | PRN
  Administered 2017-01-22: 75 mL via INTRAVENOUS

## 2017-01-29 DIAGNOSIS — R0902 Hypoxemia: Secondary | ICD-10-CM | POA: Diagnosis not present

## 2017-01-29 DIAGNOSIS — Z720 Tobacco use: Secondary | ICD-10-CM | POA: Diagnosis not present

## 2017-01-29 DIAGNOSIS — G4733 Obstructive sleep apnea (adult) (pediatric): Secondary | ICD-10-CM | POA: Diagnosis not present

## 2017-01-29 DIAGNOSIS — J431 Panlobular emphysema: Secondary | ICD-10-CM | POA: Diagnosis not present

## 2017-01-29 DIAGNOSIS — R0609 Other forms of dyspnea: Secondary | ICD-10-CM | POA: Diagnosis not present

## 2017-02-02 DIAGNOSIS — J449 Chronic obstructive pulmonary disease, unspecified: Secondary | ICD-10-CM | POA: Diagnosis not present

## 2017-02-09 ENCOUNTER — Telehealth: Payer: Self-pay | Admitting: Neurology

## 2017-02-09 NOTE — Telephone Encounter (Signed)
Patient called office in reference to infusion she had on 12/30/16  per patient about 2 1/2 weeks later she could not breath (no congestion), SOB now on oxygen.  Patient doesn't know if this is a side effect from the infusion.  Please call

## 2017-02-09 NOTE — Telephone Encounter (Signed)
I have spoken with Ashlee Fields this afternoon and per RAS, advised that resp. problems 2 weeks post infusion are likely not due to Ocrelizumab.  Rxns involving diff. breathing generally happen immed.  No other sx. noted.  She is seeing pulmonology for resp. issues and will see what that workup reveals./fim

## 2017-02-17 DIAGNOSIS — R0609 Other forms of dyspnea: Secondary | ICD-10-CM | POA: Diagnosis not present

## 2017-02-17 DIAGNOSIS — R0902 Hypoxemia: Secondary | ICD-10-CM | POA: Diagnosis not present

## 2017-02-23 DIAGNOSIS — R0602 Shortness of breath: Secondary | ICD-10-CM | POA: Diagnosis not present

## 2017-02-23 DIAGNOSIS — G4733 Obstructive sleep apnea (adult) (pediatric): Secondary | ICD-10-CM | POA: Diagnosis not present

## 2017-02-23 DIAGNOSIS — R05 Cough: Secondary | ICD-10-CM | POA: Diagnosis not present

## 2017-02-27 DIAGNOSIS — Z79899 Other long term (current) drug therapy: Secondary | ICD-10-CM | POA: Diagnosis not present

## 2017-02-27 DIAGNOSIS — I10 Essential (primary) hypertension: Secondary | ICD-10-CM | POA: Diagnosis not present

## 2017-02-27 DIAGNOSIS — E039 Hypothyroidism, unspecified: Secondary | ICD-10-CM | POA: Diagnosis not present

## 2017-02-27 DIAGNOSIS — E119 Type 2 diabetes mellitus without complications: Secondary | ICD-10-CM | POA: Diagnosis not present

## 2017-03-01 DIAGNOSIS — G4733 Obstructive sleep apnea (adult) (pediatric): Secondary | ICD-10-CM | POA: Diagnosis not present

## 2017-03-05 DIAGNOSIS — J449 Chronic obstructive pulmonary disease, unspecified: Secondary | ICD-10-CM | POA: Diagnosis not present

## 2017-03-06 DIAGNOSIS — J431 Panlobular emphysema: Secondary | ICD-10-CM | POA: Diagnosis not present

## 2017-03-06 DIAGNOSIS — Z6838 Body mass index (BMI) 38.0-38.9, adult: Secondary | ICD-10-CM | POA: Diagnosis not present

## 2017-03-06 DIAGNOSIS — G35 Multiple sclerosis: Secondary | ICD-10-CM | POA: Diagnosis not present

## 2017-03-06 DIAGNOSIS — E119 Type 2 diabetes mellitus without complications: Secondary | ICD-10-CM | POA: Diagnosis not present

## 2017-03-06 DIAGNOSIS — E7800 Pure hypercholesterolemia, unspecified: Secondary | ICD-10-CM | POA: Diagnosis not present

## 2017-03-06 DIAGNOSIS — E039 Hypothyroidism, unspecified: Secondary | ICD-10-CM | POA: Diagnosis not present

## 2017-03-06 DIAGNOSIS — I10 Essential (primary) hypertension: Secondary | ICD-10-CM | POA: Diagnosis not present

## 2017-03-16 DIAGNOSIS — G4733 Obstructive sleep apnea (adult) (pediatric): Secondary | ICD-10-CM | POA: Diagnosis not present

## 2017-03-31 DIAGNOSIS — M1711 Unilateral primary osteoarthritis, right knee: Secondary | ICD-10-CM | POA: Diagnosis not present

## 2017-03-31 DIAGNOSIS — G4733 Obstructive sleep apnea (adult) (pediatric): Secondary | ICD-10-CM | POA: Diagnosis not present

## 2017-03-31 DIAGNOSIS — M7541 Impingement syndrome of right shoulder: Secondary | ICD-10-CM | POA: Diagnosis not present

## 2017-04-04 DIAGNOSIS — J449 Chronic obstructive pulmonary disease, unspecified: Secondary | ICD-10-CM | POA: Diagnosis not present

## 2017-04-16 ENCOUNTER — Ambulatory Visit: Admission: RE | Admit: 2017-04-16 | Payer: PPO | Source: Ambulatory Visit | Admitting: Gastroenterology

## 2017-04-16 ENCOUNTER — Encounter: Admission: RE | Payer: Self-pay | Source: Ambulatory Visit

## 2017-04-16 SURGERY — COLONOSCOPY WITH PROPOFOL
Anesthesia: General

## 2017-04-22 DIAGNOSIS — Z7952 Long term (current) use of systemic steroids: Secondary | ICD-10-CM | POA: Diagnosis not present

## 2017-04-22 DIAGNOSIS — E1165 Type 2 diabetes mellitus with hyperglycemia: Secondary | ICD-10-CM | POA: Diagnosis not present

## 2017-05-01 DIAGNOSIS — G4733 Obstructive sleep apnea (adult) (pediatric): Secondary | ICD-10-CM | POA: Diagnosis not present

## 2017-05-08 DIAGNOSIS — Z79899 Other long term (current) drug therapy: Secondary | ICD-10-CM | POA: Diagnosis not present

## 2017-05-08 DIAGNOSIS — F419 Anxiety disorder, unspecified: Secondary | ICD-10-CM | POA: Diagnosis not present

## 2017-05-08 DIAGNOSIS — I10 Essential (primary) hypertension: Secondary | ICD-10-CM | POA: Diagnosis not present

## 2017-05-08 DIAGNOSIS — E039 Hypothyroidism, unspecified: Secondary | ICD-10-CM | POA: Diagnosis not present

## 2017-05-08 DIAGNOSIS — E119 Type 2 diabetes mellitus without complications: Secondary | ICD-10-CM | POA: Diagnosis not present

## 2017-05-08 DIAGNOSIS — Z1211 Encounter for screening for malignant neoplasm of colon: Secondary | ICD-10-CM | POA: Diagnosis not present

## 2017-05-08 DIAGNOSIS — G35 Multiple sclerosis: Secondary | ICD-10-CM | POA: Diagnosis not present

## 2017-05-08 DIAGNOSIS — E7800 Pure hypercholesterolemia, unspecified: Secondary | ICD-10-CM | POA: Diagnosis not present

## 2017-05-08 DIAGNOSIS — J431 Panlobular emphysema: Secondary | ICD-10-CM | POA: Diagnosis not present

## 2017-05-08 DIAGNOSIS — Z0000 Encounter for general adult medical examination without abnormal findings: Secondary | ICD-10-CM | POA: Diagnosis not present

## 2017-05-11 DIAGNOSIS — G4733 Obstructive sleep apnea (adult) (pediatric): Secondary | ICD-10-CM | POA: Diagnosis not present

## 2017-05-19 DIAGNOSIS — Z1211 Encounter for screening for malignant neoplasm of colon: Secondary | ICD-10-CM | POA: Diagnosis not present

## 2017-05-31 DIAGNOSIS — G4733 Obstructive sleep apnea (adult) (pediatric): Secondary | ICD-10-CM | POA: Diagnosis not present

## 2017-06-09 ENCOUNTER — Encounter: Payer: Self-pay | Admitting: Neurology

## 2017-06-15 ENCOUNTER — Encounter: Payer: Self-pay | Admitting: *Deleted

## 2017-06-25 DIAGNOSIS — I10 Essential (primary) hypertension: Secondary | ICD-10-CM | POA: Diagnosis not present

## 2017-06-25 DIAGNOSIS — E669 Obesity, unspecified: Secondary | ICD-10-CM | POA: Diagnosis not present

## 2017-06-25 DIAGNOSIS — J431 Panlobular emphysema: Secondary | ICD-10-CM | POA: Diagnosis not present

## 2017-06-25 DIAGNOSIS — R0609 Other forms of dyspnea: Secondary | ICD-10-CM | POA: Diagnosis not present

## 2017-06-25 DIAGNOSIS — E039 Hypothyroidism, unspecified: Secondary | ICD-10-CM | POA: Diagnosis not present

## 2017-06-25 DIAGNOSIS — Z6838 Body mass index (BMI) 38.0-38.9, adult: Secondary | ICD-10-CM | POA: Diagnosis not present

## 2017-06-25 DIAGNOSIS — E119 Type 2 diabetes mellitus without complications: Secondary | ICD-10-CM | POA: Diagnosis not present

## 2017-06-25 DIAGNOSIS — G35 Multiple sclerosis: Secondary | ICD-10-CM | POA: Diagnosis not present

## 2017-06-30 DIAGNOSIS — R0609 Other forms of dyspnea: Secondary | ICD-10-CM | POA: Diagnosis not present

## 2017-06-30 DIAGNOSIS — J432 Centrilobular emphysema: Secondary | ICD-10-CM | POA: Diagnosis not present

## 2017-06-30 DIAGNOSIS — G4733 Obstructive sleep apnea (adult) (pediatric): Secondary | ICD-10-CM | POA: Diagnosis not present

## 2017-06-30 DIAGNOSIS — J849 Interstitial pulmonary disease, unspecified: Secondary | ICD-10-CM | POA: Diagnosis not present

## 2017-06-30 DIAGNOSIS — I2723 Pulmonary hypertension due to lung diseases and hypoxia: Secondary | ICD-10-CM | POA: Diagnosis not present

## 2017-07-01 DIAGNOSIS — G4733 Obstructive sleep apnea (adult) (pediatric): Secondary | ICD-10-CM | POA: Diagnosis not present

## 2017-07-08 DIAGNOSIS — Z79899 Other long term (current) drug therapy: Secondary | ICD-10-CM | POA: Diagnosis not present

## 2017-07-08 DIAGNOSIS — E039 Hypothyroidism, unspecified: Secondary | ICD-10-CM | POA: Diagnosis not present

## 2017-07-08 DIAGNOSIS — N3090 Cystitis, unspecified without hematuria: Secondary | ICD-10-CM | POA: Diagnosis not present

## 2017-07-08 DIAGNOSIS — E7800 Pure hypercholesterolemia, unspecified: Secondary | ICD-10-CM | POA: Diagnosis not present

## 2017-07-08 DIAGNOSIS — R0602 Shortness of breath: Secondary | ICD-10-CM | POA: Diagnosis not present

## 2017-07-08 DIAGNOSIS — R0789 Other chest pain: Secondary | ICD-10-CM | POA: Diagnosis not present

## 2017-07-08 DIAGNOSIS — I10 Essential (primary) hypertension: Secondary | ICD-10-CM | POA: Diagnosis not present

## 2017-07-08 DIAGNOSIS — E1165 Type 2 diabetes mellitus with hyperglycemia: Secondary | ICD-10-CM | POA: Diagnosis not present

## 2017-07-10 DIAGNOSIS — R0602 Shortness of breath: Secondary | ICD-10-CM | POA: Insufficient documentation

## 2017-07-17 DIAGNOSIS — R0789 Other chest pain: Secondary | ICD-10-CM | POA: Diagnosis not present

## 2017-07-22 DIAGNOSIS — E1165 Type 2 diabetes mellitus with hyperglycemia: Secondary | ICD-10-CM | POA: Diagnosis not present

## 2017-07-22 DIAGNOSIS — Z7952 Long term (current) use of systemic steroids: Secondary | ICD-10-CM | POA: Diagnosis not present

## 2017-07-28 DIAGNOSIS — I10 Essential (primary) hypertension: Secondary | ICD-10-CM | POA: Diagnosis not present

## 2017-07-28 DIAGNOSIS — E7800 Pure hypercholesterolemia, unspecified: Secondary | ICD-10-CM | POA: Diagnosis not present

## 2017-07-28 DIAGNOSIS — R0602 Shortness of breath: Secondary | ICD-10-CM | POA: Diagnosis not present

## 2017-07-28 DIAGNOSIS — J431 Panlobular emphysema: Secondary | ICD-10-CM | POA: Diagnosis not present

## 2017-08-01 DIAGNOSIS — G4733 Obstructive sleep apnea (adult) (pediatric): Secondary | ICD-10-CM | POA: Diagnosis not present

## 2017-08-05 ENCOUNTER — Encounter
Admission: RE | Disposition: A | Discharge status: Home / Self Care | Payer: Self-pay | Source: Ambulatory Visit | Attending: Cardiology

## 2017-08-05 ENCOUNTER — Encounter: Payer: Self-pay | Admitting: Cardiology

## 2017-08-05 ENCOUNTER — Ambulatory Visit
Admission: RE | Admit: 2017-08-05 | Discharge: 2017-08-05 | Disposition: A | Discharge status: Home / Self Care | Payer: PPO | Source: Ambulatory Visit | Attending: Cardiology | Admitting: Cardiology

## 2017-08-05 DIAGNOSIS — Z6837 Body mass index (BMI) 37.0-37.9, adult: Secondary | ICD-10-CM | POA: Diagnosis not present

## 2017-08-05 DIAGNOSIS — G35 Multiple sclerosis: Secondary | ICD-10-CM | POA: Diagnosis not present

## 2017-08-05 DIAGNOSIS — I270 Primary pulmonary hypertension: Secondary | ICD-10-CM | POA: Diagnosis not present

## 2017-08-05 DIAGNOSIS — I272 Pulmonary hypertension, unspecified: Secondary | ICD-10-CM | POA: Diagnosis present

## 2017-08-05 DIAGNOSIS — Z882 Allergy status to sulfonamides status: Secondary | ICD-10-CM | POA: Insufficient documentation

## 2017-08-05 DIAGNOSIS — E7800 Pure hypercholesterolemia, unspecified: Secondary | ICD-10-CM | POA: Insufficient documentation

## 2017-08-05 DIAGNOSIS — R0602 Shortness of breath: Secondary | ICD-10-CM | POA: Diagnosis present

## 2017-08-05 DIAGNOSIS — R079 Chest pain, unspecified: Secondary | ICD-10-CM | POA: Insufficient documentation

## 2017-08-05 DIAGNOSIS — I10 Essential (primary) hypertension: Secondary | ICD-10-CM | POA: Insufficient documentation

## 2017-08-05 DIAGNOSIS — F3341 Major depressive disorder, recurrent, in partial remission: Secondary | ICD-10-CM | POA: Diagnosis not present

## 2017-08-05 DIAGNOSIS — E039 Hypothyroidism, unspecified: Secondary | ICD-10-CM | POA: Insufficient documentation

## 2017-08-05 DIAGNOSIS — L409 Psoriasis, unspecified: Secondary | ICD-10-CM | POA: Diagnosis not present

## 2017-08-05 DIAGNOSIS — Z880 Allergy status to penicillin: Secondary | ICD-10-CM | POA: Insufficient documentation

## 2017-08-05 DIAGNOSIS — Z7982 Long term (current) use of aspirin: Secondary | ICD-10-CM | POA: Diagnosis not present

## 2017-08-05 DIAGNOSIS — F419 Anxiety disorder, unspecified: Secondary | ICD-10-CM | POA: Insufficient documentation

## 2017-08-05 DIAGNOSIS — E669 Obesity, unspecified: Secondary | ICD-10-CM | POA: Diagnosis not present

## 2017-08-05 DIAGNOSIS — Z87891 Personal history of nicotine dependence: Secondary | ICD-10-CM | POA: Diagnosis not present

## 2017-08-05 DIAGNOSIS — M810 Age-related osteoporosis without current pathological fracture: Secondary | ICD-10-CM | POA: Diagnosis not present

## 2017-08-05 DIAGNOSIS — Z7952 Long term (current) use of systemic steroids: Secondary | ICD-10-CM | POA: Insufficient documentation

## 2017-08-05 DIAGNOSIS — J449 Chronic obstructive pulmonary disease, unspecified: Secondary | ICD-10-CM | POA: Diagnosis not present

## 2017-08-05 DIAGNOSIS — K219 Gastro-esophageal reflux disease without esophagitis: Secondary | ICD-10-CM | POA: Insufficient documentation

## 2017-08-05 DIAGNOSIS — M1990 Unspecified osteoarthritis, unspecified site: Secondary | ICD-10-CM | POA: Diagnosis not present

## 2017-08-05 DIAGNOSIS — G4730 Sleep apnea, unspecified: Secondary | ICD-10-CM | POA: Diagnosis not present

## 2017-08-05 HISTORY — PX: RIGHT HEART CATH: CATH118263

## 2017-08-05 LAB — GLUCOSE, CAPILLARY: Glucose-Capillary: 132 mg/dL — ABNORMAL HIGH (ref 65–99)

## 2017-08-05 SURGERY — RIGHT HEART CATH
Anesthesia: Moderate Sedation

## 2017-08-05 SURGERY — RIGHT HEART CATH
Anesthesia: Moderate Sedation | Laterality: Right

## 2017-08-05 MED ORDER — SODIUM CHLORIDE 0.9 % IV SOLN: 250.0000 mL | INTRAVENOUS | Status: DC | PRN

## 2017-08-05 MED ORDER — FENTANYL CITRATE (PF) 100 MCG/2ML IJ SOLN
INTRAMUSCULAR | Status: AC
  Filled 2017-08-05: qty 2

## 2017-08-05 MED ORDER — MIDAZOLAM HCL 2 MG/2ML IJ SOLN
INTRAMUSCULAR | Status: DC | PRN
  Administered 2017-08-05: 1 mg via INTRAVENOUS

## 2017-08-05 MED ORDER — SODIUM CHLORIDE 0.9% FLUSH: 3.0000 mL | INTRAVENOUS | Status: DC | PRN

## 2017-08-05 MED ORDER — SODIUM CHLORIDE 0.9% FLUSH: 3.0000 mL | Freq: Two times a day (BID) | INTRAVENOUS | Status: DC

## 2017-08-05 MED ORDER — FENTANYL CITRATE (PF) 100 MCG/2ML IJ SOLN
INTRAMUSCULAR | Status: DC | PRN
  Administered 2017-08-05: 25 ug via INTRAVENOUS

## 2017-08-05 MED ORDER — MIDAZOLAM HCL 2 MG/2ML IJ SOLN
INTRAMUSCULAR | Status: AC
  Filled 2017-08-05: qty 2

## 2017-08-05 MED ORDER — LIDOCAINE HCL (PF) 1 % IJ SOLN
INTRAMUSCULAR | Status: DC | PRN
  Administered 2017-08-05: 15 mL via INTRADERMAL

## 2017-08-05 MED ORDER — LIDOCAINE HCL (PF) 1 % IJ SOLN
INTRAMUSCULAR | Status: AC
  Filled 2017-08-05: qty 30

## 2017-08-05 MED ORDER — SODIUM CHLORIDE 0.9 % IV SOLN
INTRAVENOUS | Status: DC
  Administered 2017-08-05: 08:00:00 via INTRAVENOUS

## 2017-08-05 MED ORDER — SODIUM CHLORIDE 0.9 % IV SOLN: INTRAVENOUS | Status: DC

## 2017-08-05 SURGICAL SUPPLY — 7 items
CATH SWANZ 7F THERMO (CATHETERS) ×2 IMPLANT
GUIDEWIRE EMER 3M J .025X150CM (WIRE) ×2 IMPLANT
KIT MANI 3VAL PERCEP (MISCELLANEOUS) ×2 IMPLANT
KIT RIGHT HEART (MISCELLANEOUS) ×2 IMPLANT
NEEDLE PERC 18GX7CM (NEEDLE) ×2 IMPLANT
PACK CARDIAC CATH (CUSTOM PROCEDURE TRAY) ×2 IMPLANT
SHEATH PINNACLE 7F 10CM (SHEATH) ×2 IMPLANT

## 2017-08-05 NOTE — Discharge Instructions (Signed)
Groin Insertion Instructions-If you lose feeling or develop tingling or pain in your leg or foot after the procedure, please walk around first.  If the discomfort does not improve , contact your physician and proceed to the nearest emergency room.  Loss of feeling in your leg might mean that a blockage has formed in the artery and this can be appropriately treated.  Limit your activity for the next two days after your procedure.  Avoid stooping, bending, heavy lifting or exertion as this may put pressure on the insertion site.  Resume normal activities in 48 hours.  You may shower after 24 hours but avoid excessive warm water and do not scrub the site.  Remove clear dressing in 48 hours.  If you have had a closure device inserted, do not soak in a tub bath or a hot tub for at least one week.  No driving for 48 hours after discharge.  After the procedure, check the insertion site occasionally.  If any oozing occurs or there is apparent swelling, firm pressure over the site will prevent a bruise from forming.  You can not hurt anything by pressing directly on the site.  The pressure stops the bleeding by allowing a small clot to form.  If the bleeding continues after the pressure has been applied for more than 15 minutes, call 911 or go to the nearest emergency room.    The x-ray dye causes you to pass a considerate amount of urine.  For this reason, you will be asked to drink plenty of liquids after the procedure to prevent dehydration.  You may resume you regular diet.  Avoid caffeine products.    For pain at the site of your procedure, take non-aspirin medicines such as Tylenol.  Medications: A. Hold Metformin for 48 hours if applicable.  B. Continue taking all your present medications at home unless your doctor prescribes any changes.  Moderate Conscious Sedation, Adult, Care After These instructions provide you with information about caring for yourself after your procedure. Your health care provider  may also give you more specific instructions. Your treatment has been planned according to current medical practices, but problems sometimes occur. Call your health care provider if you have any problems or questions after your procedure. What can I expect after the procedure? After your procedure, it is common:  To feel sleepy for several hours.  To feel clumsy and have poor balance for several hours.  To have poor judgment for several hours.  To vomit if you eat too soon.  Follow these instructions at home: For at least 24 hours after the procedure:   Do not: ? Participate in activities where you could fall or become injured. ? Drive. ? Use heavy machinery. ? Drink alcohol. ? Take sleeping pills or medicines that cause drowsiness. ? Make important decisions or sign legal documents. ? Take care of children on your own.  Rest. Eating and drinking  Follow the diet recommended by your health care provider.  If you vomit: ? Drink water, juice, or soup when you can drink without vomiting. ? Make sure you have little or no nausea before eating solid foods. General instructions  Have a responsible adult stay with you until you are awake and alert.  Take over-the-counter and prescription medicines only as told by your health care provider.  If you smoke, do not smoke without supervision.  Keep all follow-up visits as told by your health care provider. This is important. Contact a health care provider  if:  You keep feeling nauseous or you keep vomiting.  You feel light-headed.  You develop a rash.  You have a fever. Get help right away if:  You have trouble breathing. This information is not intended to replace advice given to you by your health care provider. Make sure you discuss any questions you have with your health care provider. Document Released: 08/31/2013 Document Revised: 04/14/2016 Document Reviewed: 03/01/2016 Elsevier Interactive Patient Education  [employer].

## 2017-08-05 NOTE — H&P (Signed)
Chief Complaint: Chief Complaint  Patient presents with  . Follow-up  lexiscan  Date of Service: 07/28/2017 Date of Birth: 20-Aug-1956 PCP: [name], MD  History of Present Illness: Ashlee Fields is a 61 y.o.female patient who turns for evaluation. She has been evaluated for exertional fatigue and shortness of breath. She underwent a Lexiscan sestamibi which revealed preserved LV function with no reversible ischemia. She has sleep apnea and states she is compliant with CPAP. Echo estimated her peak RV systolic pressure of 44. This is up from 30 approximately a year or 2 ago. She is being seen by pulmonology. Functional study was done to assess possible need for left heart cath in addition to right heart cath. She also complains of shortness of breath. She states she is compliant with her medications.  Past Medical and Surgical History  Past Medical History Past Medical History:  Diagnosis Date  . Acid reflux  . Anxiety, unspecified  . Benign hypertension  . COPD (chronic obstructive pulmonary disease) , unspecified (CMS-HCC)  tobacco abuse  . Depression, unspecified  anxiety  . Environmental allergies  . Fibrocystic breast disease  . History of chickenpox  . Hyperlipidemia, unspecified, unspecified  . Hypothyroidism, unspecified, unspecified  . Multiple sclerosis (CMS-HCC)  . Obesity, unspecified  . Osteoarthritis  . Osteoporosis, post-menopausal  . Psoriasis, unspecified  . Sinusitis, unspecified, unspecified  recurrent  . Sleep apnea  . Tobacco abuse   Past Surgical History She has a past surgical history that includes Knee arthroscopy (Left); Cesarean section (1986); and Joint replacement (Left, 2009).   Medications and Allergies  Current Medications  Current Outpatient Prescriptions  Medication Sig Dispense Refill  . acetaminophen (TYLENOL) 500 MG tablet Take 500 mg by mouth every 6 (six) hours as needed for Pain.  Marland Kitchen alendronate (FOSAMAX) 70 MG tablet TAKE 1  TABLET BY MOUTH WEEKLY 12 tablet 1  . amLODIPine (NORVASC) 5 MG tablet TAKE 1 TABLET (5 MG TOTAL) BY MOUTH ONCE DAILY. 90 tablet 3  . ascorbic acid (VITAMIN C) 500 MG tablet Take 500 mg by mouth 2 (two) times daily.  Marland Kitchen aspirin 81 MG EC tablet Take 81 mg by mouth 2 (two) times daily.  Marland Kitchen atorvastatin (LIPITOR) 80 MG tablet TAKE 1 TABLET BY MOUTH ONCE DAILY 90 tablet 1  . baclofen (LIORESAL) 20 MG tablet TAKE 2 TABLETS BY MOUTH THREE TIMES A DAY 540 tablet 1  . blood glucose diagnostic (ONETOUCH VERIO) test strip Use once daily. ONE TOUCH VERIO TEST STRIPS. E11.65 100 each 3  . calcium carbonate-vitamin D3 (CALCIUM+D) 500 mg(1,217m) -200 unit tablet Take 1 tablet by mouth 2 (two) times daily with meals.  . cyanocobalamin (VITAMIN B12) 1,000 mcg/mL injection Inject 1 mL (1,000 mcg total) into the muscle monthly. 9 mL 3  . diazePAM (VALIUM) 2 MG tablet TAKE 1 TABLET BY MOUTH EVERY 6 HOURS AS NEEDED FOR ANXIETY 360 tablet 0  . FLUoxetine (PROZAC) 20 MG capsule TAKE ONE CAPSULE BY MOUTH 3 TIMES A DAY 270 capsule 1  . hydroCHLOROthiazide (HYDRODIURIL) 25 MG tablet TAKE 1 TABLET BY MOUTH EVERY MORNING FOR BLOOD PRESSURE 90 tablet 2  . lancets (ONETOUCH DELICA LANCETS) 33 gauge Misc Use 1 each once daily. ONE TOUCH DELICA LANCETS. EK16.01100 each 3  . levothyroxine (SYNTHROID, LEVOTHROID) 50 MCG tablet Take 1 tablet (50 mcg total) by mouth once daily. Take on an empty stomach with a glass of water at least 30-60 minutes before breakfast. 30 tablet 11  . LORazepam (  ATIVAN) 1 MG tablet TAKE 1 TABLET BY MOUTH 2 TIMES DAILY AS NEEDED FOR ANXIETY 60 tablet 3  . losartan-hydrochlorothiazide (HYZAAR) 100-12.5 mg tablet TAKE 1 TABLET BY MOUTH DAILY 90 tablet 1  . metFORMIN (GLUCOPHAGE-XR) 500 MG XR tablet Take four tablets once daily. 360 tablet 3  . methylphenidate HCl (RITALIN) 10 MG tablet Take 1 tablet (10 mg total) by mouth 3 (three) times a day. 90 tablet 0  . mv, min #36-iron,carbonyl-FA (GERITOL COMPLETE)  16 mg iron- 0.38 mg Tab Take 2 tablets by mouth once daily.  Ashlee Fields VERIO FLEX START kit Use daily to check blood sugars. ONE TOUCH VERIO FLEX METER KIT. E11.65 1 each 0  . pantoprazole (PROTONIX) 40 MG DR tablet TAKE 1 TABLET BY MOUTH TWICE A DAY 180 tablet 1  . phendimetrazine tartrate (BONTRIL SR, MELFIAT) 105 mg ER capsule Take 1 capsule (105 mg total) by mouth every morning before breakfast. 30 capsule 2  . predniSONE (DELTASONE) 10 MG tablet Take 10 mg by mouth once daily.   No current facility-administered medications for this visit.   Allergies: Ibuprofen; Penicillins; and Sulfa (sulfonamide antibiotics)  Social and Family History  Social History reports that she quit smoking about 6 months ago. Her smoking use included Cigarettes. She has a 20.00 pack-year smoking history. She has never used smokeless tobacco. She reports that she does not drink alcohol or use drugs.  Family History Family History  Problem Relation Age of Onset  . High blood pressure (Hypertension) Father  . Kidney failure Father  . Heart disease Mother  CABG  . High blood pressure (Hypertension) Mother  . Colon cancer Maternal Grandmother  . Breast cancer Paternal Grandmother  . No Known Problems Sister  . Lung cancer Maternal Grandfather  . Emphysema Paternal Grandfather  . No Known Problems Sister  . No Known Problems Daughter   Review of Systems  Review of Systems  Constitutional: Negative for chills, diaphoresis, fever, malaise/fatigue and weight loss.  HENT: Negative for congestion, ear discharge, hearing loss and tinnitus.  Eyes: Negative for blurred vision.  Respiratory: Positive for shortness of breath. Negative for cough, hemoptysis, sputum production and wheezing.  Cardiovascular: Positive for chest pain. Negative for palpitations, orthopnea, claudication, leg swelling and PND.  Gastrointestinal: Negative for abdominal pain, blood in stool, constipation, diarrhea, heartburn, melena, nausea  and vomiting.  Genitourinary: Negative for dysuria, frequency, hematuria and urgency.  Musculoskeletal: Negative for back pain, falls, joint pain and myalgias.  Skin: Negative for itching and rash.  Neurological: Negative for dizziness, tingling, focal weakness, loss of consciousness, weakness and headaches.  Endo/Heme/Allergies: Negative for polydipsia. Does not bruise/bleed easily.  Psychiatric/Behavioral: Negative for depression, memory loss and substance abuse. The patient is nervous/anxious.   Physical Examination   Vitals: BP 130/78  Pulse 100  Resp 12  Ht 157.5 cm (_0 )  Wt 92.1 kg (203 lb)  LMP (LMP Unknown)  BMI 37.13 kg/m  Ht:157.5 cm (_1 ) Wt:92.1 kg (203 lb) DJM:EQAS surface area is 2.01 meters squared. Body mass index is 37.13 kg/m.  Wt Readings from Last 3 Encounters:  07/28/17 92.1 kg (203 lb)  07/22/17 94 kg (207 lb 3.2 oz)  07/08/17 92.9 kg (204 lb 12.8 oz)   BP Readings from Last 3 Encounters:  07/28/17 130/78  07/22/17 118/70  07/08/17 132/70   General appearance appears in no acute distress  Head Mouth and Eye exam Normocephalic, without obvious abnormality, atraumatic Dentition is good Eyes appear anicteric   Neck  exam Thyroid: normal  Nodes: no obvious adenopathy  LUNGS Breath Sounds: Normal Percussion: Normal  CARDIOVASCULAR JVP CV wave: no HJR: no Elevation at 90 degrees: None Carotid Pulse: normal pulsation bilaterally Bruit: None Apex: apical impulse normal  Auscultation Rhythm: normal sinus rhythm S1: normal S2: normal Clicks: no Rub: no Murmurs: no murmurs  Gallop: None ABDOMEN Liver enlargement: no Pulsatile aorta: no Ascites: no Bruits: no  EXTREMITIES Clubbing: no Edema: trace to 1+ bilateral pedal edema Pulses: peripheral pulses symmetrical Femoral Bruits: no Amputation: no SKIN Rash: no Cyanosis: no Embolic phemonenon: no Bruising: no NEURO Alert and Oriented to person, place and time: yes Non  focal: yes  PSYCH: Pt appears to have normal affect  LABS REVIEWED Last 3 CBC results: Lab Results  Component Value Date  WBC 9.4 07/08/2017  WBC 7.0 02/27/2017  WBC 10.9 (H) 11/25/2016   Lab Results  Component Value Date  HGB 12.7 07/08/2017  HGB 13.3 02/27/2017  HGB 13.0 11/25/2016   Lab Results  Component Value Date  HCT 38.1 07/08/2017  HCT 38.8 02/27/2017  HCT 38.4 11/25/2016   Lab Results  Component Value Date  PLT 368 07/08/2017  PLT 325 02/27/2017  PLT 336 11/25/2016   Lab Results  Component Value Date  CREATININE 0.7 07/08/2017  BUN 14 07/08/2017  NA 141 07/08/2017  K 4.1 07/08/2017  CL 103 07/08/2017  CO2 29.5 07/08/2017   Lab Results  Component Value Date  HGBA1C 7.7 (H) 07/08/2017   Lab Results  Component Value Date  HDL 59.7 07/08/2017  HDL 72.1 11/25/2016  HDL 68.3 07/25/2016   Lab Results  Component Value Date  LDLCALC 44 07/08/2017  LDLCALC 62 11/25/2016  LDLCALC 47 07/25/2016   Lab Results  Component Value Date  TRIG 286 (H) 07/08/2017  TRIG 329 (H) 11/25/2016  TRIG 317 (H) 07/25/2016   Lab Results  Component Value Date  ALT 23 07/08/2017  AST 17 07/08/2017  ALKPHOS 72 07/08/2017   Lab Results  Component Value Date  TSH 1.405 07/08/2017   Diagnostic Studies Reviewed:  EKG EKG demonstrated normal sinus rhythm, nonspecific ST and T waves changes.  Assessment and Plan   61 y.o. female with  ICD-10-CM ICD-9-CM  1. Acute chest pain, unspecified-etiology unclear. Electrocardiogram reveals sinus rhythm with nonspecific ST T wave changes. Functional study showed no reversible ischemia. We will proceed with right heart cath to better evaluate her pulmonary hypertension. R07.9 786.50 ECG 12-lead  2. SOB (shortness of breath)-has preserved LV function by echo. Has mildly elevated right-sided pressures read as showing 44 mmHg. We will proceed with a right heart cath to better assess her pulmonary pressures to guide further therapy.  R06.02 786.05  3. Pure hypercholesterolemia-continue with atorvastatin and follow with the and low-fat diet with an LDL goal of less than 100. Current value was 44 E78.00 272.0  4. Essential hypertension with goal blood pressure less than 140/90-continue with current medications I10 401.9  5. Panlobular emphysema (HCC) J43.1 492.8  6. Recurrent major depressive disorder, in partial remission ([place]) F33.41 296.35  7. Multiple sclerosis ([place]) G35 340   Return in about 4 weeks (around 08/25/2017).  These notes generated with voice recognition software. I apologize for typographical errors.  Sydnee Levans, MD    Pt seen and examined. No change from above.

## 2017-08-07 DIAGNOSIS — I10 Essential (primary) hypertension: Secondary | ICD-10-CM | POA: Diagnosis not present

## 2017-08-12 DIAGNOSIS — G35 Multiple sclerosis: Secondary | ICD-10-CM | POA: Diagnosis not present

## 2017-08-12 DIAGNOSIS — E7800 Pure hypercholesterolemia, unspecified: Secondary | ICD-10-CM | POA: Diagnosis not present

## 2017-08-12 DIAGNOSIS — I10 Essential (primary) hypertension: Secondary | ICD-10-CM | POA: Diagnosis not present

## 2017-08-12 DIAGNOSIS — E119 Type 2 diabetes mellitus without complications: Secondary | ICD-10-CM | POA: Diagnosis not present

## 2017-08-12 DIAGNOSIS — E039 Hypothyroidism, unspecified: Secondary | ICD-10-CM | POA: Diagnosis not present

## 2017-08-12 DIAGNOSIS — Z1231 Encounter for screening mammogram for malignant neoplasm of breast: Secondary | ICD-10-CM | POA: Diagnosis not present

## 2017-08-12 DIAGNOSIS — J431 Panlobular emphysema: Secondary | ICD-10-CM | POA: Diagnosis not present

## 2017-08-17 ENCOUNTER — Other Ambulatory Visit: Payer: Self-pay | Admitting: Internal Medicine

## 2017-08-17 DIAGNOSIS — Z1231 Encounter for screening mammogram for malignant neoplasm of breast: Secondary | ICD-10-CM

## 2017-09-03 DIAGNOSIS — R0789 Other chest pain: Secondary | ICD-10-CM | POA: Diagnosis not present

## 2017-09-03 DIAGNOSIS — I10 Essential (primary) hypertension: Secondary | ICD-10-CM | POA: Diagnosis not present

## 2017-09-03 DIAGNOSIS — E7800 Pure hypercholesterolemia, unspecified: Secondary | ICD-10-CM | POA: Diagnosis not present

## 2017-09-22 DIAGNOSIS — G35 Multiple sclerosis: Secondary | ICD-10-CM | POA: Diagnosis not present

## 2017-09-22 DIAGNOSIS — G4733 Obstructive sleep apnea (adult) (pediatric): Secondary | ICD-10-CM | POA: Diagnosis not present

## 2017-10-01 DIAGNOSIS — G4733 Obstructive sleep apnea (adult) (pediatric): Secondary | ICD-10-CM | POA: Diagnosis not present

## 2017-10-02 DIAGNOSIS — M545 Low back pain: Secondary | ICD-10-CM | POA: Diagnosis not present

## 2017-10-02 DIAGNOSIS — M1711 Unilateral primary osteoarthritis, right knee: Secondary | ICD-10-CM | POA: Diagnosis not present

## 2017-10-02 DIAGNOSIS — M7541 Impingement syndrome of right shoulder: Secondary | ICD-10-CM | POA: Diagnosis not present

## 2017-10-02 DIAGNOSIS — R1900 Intra-abdominal and pelvic swelling, mass and lump, unspecified site: Secondary | ICD-10-CM | POA: Diagnosis not present

## 2017-10-02 DIAGNOSIS — M47816 Spondylosis without myelopathy or radiculopathy, lumbar region: Secondary | ICD-10-CM | POA: Diagnosis not present

## 2017-10-07 ENCOUNTER — Other Ambulatory Visit: Payer: Self-pay | Admitting: Orthopedic Surgery

## 2017-10-07 DIAGNOSIS — M47816 Spondylosis without myelopathy or radiculopathy, lumbar region: Secondary | ICD-10-CM

## 2017-10-12 DIAGNOSIS — R279 Unspecified lack of coordination: Secondary | ICD-10-CM | POA: Diagnosis not present

## 2017-10-12 DIAGNOSIS — M6281 Muscle weakness (generalized): Secondary | ICD-10-CM | POA: Diagnosis not present

## 2017-10-12 DIAGNOSIS — M25612 Stiffness of left shoulder, not elsewhere classified: Secondary | ICD-10-CM | POA: Diagnosis not present

## 2017-10-12 DIAGNOSIS — R2689 Other abnormalities of gait and mobility: Secondary | ICD-10-CM | POA: Diagnosis not present

## 2017-10-13 ENCOUNTER — Ambulatory Visit
Admission: RE | Admit: 2017-10-13 | Discharge: 2017-10-13 | Disposition: A | Discharge status: Home / Self Care | Payer: PPO | Source: Ambulatory Visit | Attending: Internal Medicine | Admitting: Internal Medicine

## 2017-10-13 DIAGNOSIS — Z1231 Encounter for screening mammogram for malignant neoplasm of breast: Secondary | ICD-10-CM | POA: Insufficient documentation

## 2017-10-19 ENCOUNTER — Ambulatory Visit: Admission: RE | Admit: 2017-10-19 | Payer: PPO | Source: Ambulatory Visit

## 2017-10-20 DIAGNOSIS — M6281 Muscle weakness (generalized): Secondary | ICD-10-CM | POA: Diagnosis not present

## 2017-10-20 DIAGNOSIS — R2689 Other abnormalities of gait and mobility: Secondary | ICD-10-CM | POA: Diagnosis not present

## 2017-10-20 DIAGNOSIS — R279 Unspecified lack of coordination: Secondary | ICD-10-CM | POA: Diagnosis not present

## 2017-10-20 DIAGNOSIS — M25612 Stiffness of left shoulder, not elsewhere classified: Secondary | ICD-10-CM | POA: Diagnosis not present

## 2017-10-21 ENCOUNTER — Other Ambulatory Visit: Payer: Self-pay | Admitting: Orthopedic Surgery

## 2017-10-21 DIAGNOSIS — M545 Low back pain: Secondary | ICD-10-CM

## 2017-10-21 DIAGNOSIS — M6281 Muscle weakness (generalized): Secondary | ICD-10-CM | POA: Diagnosis not present

## 2017-10-21 DIAGNOSIS — M47816 Spondylosis without myelopathy or radiculopathy, lumbar region: Secondary | ICD-10-CM

## 2017-10-21 DIAGNOSIS — R2689 Other abnormalities of gait and mobility: Secondary | ICD-10-CM | POA: Diagnosis not present

## 2017-10-21 DIAGNOSIS — R279 Unspecified lack of coordination: Secondary | ICD-10-CM | POA: Diagnosis not present

## 2017-10-21 DIAGNOSIS — M25612 Stiffness of left shoulder, not elsewhere classified: Secondary | ICD-10-CM | POA: Diagnosis not present

## 2017-10-22 DIAGNOSIS — Z7952 Long term (current) use of systemic steroids: Secondary | ICD-10-CM | POA: Diagnosis not present

## 2017-10-22 DIAGNOSIS — E1165 Type 2 diabetes mellitus with hyperglycemia: Secondary | ICD-10-CM | POA: Diagnosis not present

## 2017-10-26 DIAGNOSIS — M461 Sacroiliitis, not elsewhere classified: Secondary | ICD-10-CM | POA: Diagnosis not present

## 2017-10-29 DIAGNOSIS — R2689 Other abnormalities of gait and mobility: Secondary | ICD-10-CM | POA: Diagnosis not present

## 2017-10-29 DIAGNOSIS — R279 Unspecified lack of coordination: Secondary | ICD-10-CM | POA: Diagnosis not present

## 2017-10-29 DIAGNOSIS — M25612 Stiffness of left shoulder, not elsewhere classified: Secondary | ICD-10-CM | POA: Diagnosis not present

## 2017-10-29 DIAGNOSIS — M6281 Muscle weakness (generalized): Secondary | ICD-10-CM | POA: Diagnosis not present

## 2017-10-31 DIAGNOSIS — G4733 Obstructive sleep apnea (adult) (pediatric): Secondary | ICD-10-CM | POA: Diagnosis not present

## 2017-11-03 DIAGNOSIS — M6281 Muscle weakness (generalized): Secondary | ICD-10-CM | POA: Diagnosis not present

## 2017-11-03 DIAGNOSIS — R279 Unspecified lack of coordination: Secondary | ICD-10-CM | POA: Diagnosis not present

## 2017-11-03 DIAGNOSIS — M25612 Stiffness of left shoulder, not elsewhere classified: Secondary | ICD-10-CM | POA: Diagnosis not present

## 2017-11-03 DIAGNOSIS — R2689 Other abnormalities of gait and mobility: Secondary | ICD-10-CM | POA: Diagnosis not present

## 2017-11-04 ENCOUNTER — Ambulatory Visit
Admission: RE | Admit: 2017-11-04 | Discharge: 2017-11-04 | Disposition: A | Discharge status: Home / Self Care | Payer: PPO | Source: Ambulatory Visit | Attending: Orthopedic Surgery | Admitting: Orthopedic Surgery

## 2017-11-04 DIAGNOSIS — M47816 Spondylosis without myelopathy or radiculopathy, lumbar region: Secondary | ICD-10-CM | POA: Diagnosis not present

## 2017-11-04 DIAGNOSIS — M545 Low back pain: Secondary | ICD-10-CM

## 2017-11-04 DIAGNOSIS — M48061 Spinal stenosis, lumbar region without neurogenic claudication: Secondary | ICD-10-CM | POA: Insufficient documentation

## 2017-11-04 DIAGNOSIS — M461 Sacroiliitis, not elsewhere classified: Secondary | ICD-10-CM | POA: Diagnosis not present

## 2017-11-04 DIAGNOSIS — M5126 Other intervertebral disc displacement, lumbar region: Secondary | ICD-10-CM | POA: Diagnosis not present

## 2017-11-04 LAB — CREATININE, SERUM
Creatinine, Ser: 0.69 mg/dL (ref 0.44–1.00)
GFR calc Af Amer: >60 mL/min (ref >60)
GFR calc non Af Amer: >60 mL/min (ref >60)

## 2017-11-04 MED ORDER — GADOBENATE DIMEGLUMINE 529 MG/ML IV SOLN
20.0000 mL | Freq: Once | INTRAVENOUS | Status: AC | PRN
  Administered 2017-11-04: 19 mL via INTRAVENOUS

## 2017-11-05 DIAGNOSIS — R279 Unspecified lack of coordination: Secondary | ICD-10-CM | POA: Diagnosis not present

## 2017-11-05 DIAGNOSIS — R2689 Other abnormalities of gait and mobility: Secondary | ICD-10-CM | POA: Diagnosis not present

## 2017-11-05 DIAGNOSIS — M25612 Stiffness of left shoulder, not elsewhere classified: Secondary | ICD-10-CM | POA: Diagnosis not present

## 2017-11-05 DIAGNOSIS — M6281 Muscle weakness (generalized): Secondary | ICD-10-CM | POA: Diagnosis not present

## 2017-11-10 DIAGNOSIS — M6281 Muscle weakness (generalized): Secondary | ICD-10-CM | POA: Diagnosis not present

## 2017-11-10 DIAGNOSIS — M25612 Stiffness of left shoulder, not elsewhere classified: Secondary | ICD-10-CM | POA: Diagnosis not present

## 2017-11-10 DIAGNOSIS — R279 Unspecified lack of coordination: Secondary | ICD-10-CM | POA: Diagnosis not present

## 2017-11-10 DIAGNOSIS — R2689 Other abnormalities of gait and mobility: Secondary | ICD-10-CM | POA: Diagnosis not present

## 2017-11-13 DIAGNOSIS — G35 Multiple sclerosis: Secondary | ICD-10-CM | POA: Diagnosis not present

## 2017-11-13 DIAGNOSIS — E7800 Pure hypercholesterolemia, unspecified: Secondary | ICD-10-CM | POA: Diagnosis not present

## 2017-11-13 DIAGNOSIS — Z79899 Other long term (current) drug therapy: Secondary | ICD-10-CM | POA: Diagnosis not present

## 2017-11-13 DIAGNOSIS — I10 Essential (primary) hypertension: Secondary | ICD-10-CM | POA: Diagnosis not present

## 2017-11-13 DIAGNOSIS — E039 Hypothyroidism, unspecified: Secondary | ICD-10-CM | POA: Diagnosis not present

## 2017-11-13 DIAGNOSIS — E119 Type 2 diabetes mellitus without complications: Secondary | ICD-10-CM | POA: Diagnosis not present

## 2017-11-13 DIAGNOSIS — J431 Panlobular emphysema: Secondary | ICD-10-CM | POA: Diagnosis not present

## 2017-11-13 DIAGNOSIS — Z6838 Body mass index (BMI) 38.0-38.9, adult: Secondary | ICD-10-CM | POA: Diagnosis not present

## 2017-11-26 DIAGNOSIS — M6281 Muscle weakness (generalized): Secondary | ICD-10-CM | POA: Diagnosis not present

## 2017-11-26 DIAGNOSIS — M25612 Stiffness of left shoulder, not elsewhere classified: Secondary | ICD-10-CM | POA: Diagnosis not present

## 2017-11-26 DIAGNOSIS — R279 Unspecified lack of coordination: Secondary | ICD-10-CM | POA: Diagnosis not present

## 2017-11-26 DIAGNOSIS — R2689 Other abnormalities of gait and mobility: Secondary | ICD-10-CM | POA: Diagnosis not present

## 2017-12-01 DIAGNOSIS — R0602 Shortness of breath: Secondary | ICD-10-CM | POA: Diagnosis not present

## 2017-12-01 DIAGNOSIS — E663 Overweight: Secondary | ICD-10-CM | POA: Diagnosis not present

## 2017-12-01 DIAGNOSIS — G4733 Obstructive sleep apnea (adult) (pediatric): Secondary | ICD-10-CM | POA: Diagnosis not present

## 2017-12-01 DIAGNOSIS — J449 Chronic obstructive pulmonary disease, unspecified: Secondary | ICD-10-CM | POA: Diagnosis not present

## 2017-12-02 DIAGNOSIS — M25612 Stiffness of left shoulder, not elsewhere classified: Secondary | ICD-10-CM | POA: Diagnosis not present

## 2017-12-02 DIAGNOSIS — R2689 Other abnormalities of gait and mobility: Secondary | ICD-10-CM | POA: Diagnosis not present

## 2017-12-02 DIAGNOSIS — R279 Unspecified lack of coordination: Secondary | ICD-10-CM | POA: Diagnosis not present

## 2017-12-02 DIAGNOSIS — M6281 Muscle weakness (generalized): Secondary | ICD-10-CM | POA: Diagnosis not present

## 2017-12-07 DIAGNOSIS — R2689 Other abnormalities of gait and mobility: Secondary | ICD-10-CM | POA: Diagnosis not present

## 2017-12-07 DIAGNOSIS — R279 Unspecified lack of coordination: Secondary | ICD-10-CM | POA: Diagnosis not present

## 2017-12-07 DIAGNOSIS — M6281 Muscle weakness (generalized): Secondary | ICD-10-CM | POA: Diagnosis not present

## 2017-12-07 DIAGNOSIS — M25612 Stiffness of left shoulder, not elsewhere classified: Secondary | ICD-10-CM | POA: Diagnosis not present

## 2017-12-09 DIAGNOSIS — M6281 Muscle weakness (generalized): Secondary | ICD-10-CM | POA: Diagnosis not present

## 2017-12-09 DIAGNOSIS — R2689 Other abnormalities of gait and mobility: Secondary | ICD-10-CM | POA: Diagnosis not present

## 2017-12-09 DIAGNOSIS — M25612 Stiffness of left shoulder, not elsewhere classified: Secondary | ICD-10-CM | POA: Diagnosis not present

## 2017-12-09 DIAGNOSIS — R279 Unspecified lack of coordination: Secondary | ICD-10-CM | POA: Diagnosis not present

## 2017-12-15 DIAGNOSIS — R279 Unspecified lack of coordination: Secondary | ICD-10-CM | POA: Diagnosis not present

## 2017-12-15 DIAGNOSIS — M25612 Stiffness of left shoulder, not elsewhere classified: Secondary | ICD-10-CM | POA: Diagnosis not present

## 2017-12-15 DIAGNOSIS — R2689 Other abnormalities of gait and mobility: Secondary | ICD-10-CM | POA: Diagnosis not present

## 2017-12-15 DIAGNOSIS — M6281 Muscle weakness (generalized): Secondary | ICD-10-CM | POA: Diagnosis not present

## 2017-12-22 DIAGNOSIS — M25612 Stiffness of left shoulder, not elsewhere classified: Secondary | ICD-10-CM | POA: Diagnosis not present

## 2017-12-22 DIAGNOSIS — M6281 Muscle weakness (generalized): Secondary | ICD-10-CM | POA: Diagnosis not present

## 2017-12-22 DIAGNOSIS — R279 Unspecified lack of coordination: Secondary | ICD-10-CM | POA: Diagnosis not present

## 2017-12-22 DIAGNOSIS — R2689 Other abnormalities of gait and mobility: Secondary | ICD-10-CM | POA: Diagnosis not present

## 2017-12-24 DIAGNOSIS — M6281 Muscle weakness (generalized): Secondary | ICD-10-CM | POA: Diagnosis not present

## 2017-12-24 DIAGNOSIS — R2689 Other abnormalities of gait and mobility: Secondary | ICD-10-CM | POA: Diagnosis not present

## 2017-12-24 DIAGNOSIS — R279 Unspecified lack of coordination: Secondary | ICD-10-CM | POA: Diagnosis not present

## 2017-12-24 DIAGNOSIS — M25612 Stiffness of left shoulder, not elsewhere classified: Secondary | ICD-10-CM | POA: Diagnosis not present

## 2017-12-29 DIAGNOSIS — R279 Unspecified lack of coordination: Secondary | ICD-10-CM | POA: Diagnosis not present

## 2017-12-29 DIAGNOSIS — M6281 Muscle weakness (generalized): Secondary | ICD-10-CM | POA: Diagnosis not present

## 2017-12-29 DIAGNOSIS — M25612 Stiffness of left shoulder, not elsewhere classified: Secondary | ICD-10-CM | POA: Diagnosis not present

## 2017-12-29 DIAGNOSIS — R2689 Other abnormalities of gait and mobility: Secondary | ICD-10-CM | POA: Diagnosis not present

## 2017-12-31 DIAGNOSIS — R279 Unspecified lack of coordination: Secondary | ICD-10-CM | POA: Diagnosis not present

## 2017-12-31 DIAGNOSIS — M25612 Stiffness of left shoulder, not elsewhere classified: Secondary | ICD-10-CM | POA: Diagnosis not present

## 2017-12-31 DIAGNOSIS — M6281 Muscle weakness (generalized): Secondary | ICD-10-CM | POA: Diagnosis not present

## 2017-12-31 DIAGNOSIS — R2689 Other abnormalities of gait and mobility: Secondary | ICD-10-CM | POA: Diagnosis not present

## 2018-01-15 DIAGNOSIS — R8290 Unspecified abnormal findings in urine: Secondary | ICD-10-CM | POA: Diagnosis not present

## 2018-01-15 DIAGNOSIS — I10 Essential (primary) hypertension: Secondary | ICD-10-CM | POA: Diagnosis not present

## 2018-01-15 DIAGNOSIS — Z79899 Other long term (current) drug therapy: Secondary | ICD-10-CM | POA: Diagnosis not present

## 2018-01-15 DIAGNOSIS — E7800 Pure hypercholesterolemia, unspecified: Secondary | ICD-10-CM | POA: Diagnosis not present

## 2018-01-15 DIAGNOSIS — E039 Hypothyroidism, unspecified: Secondary | ICD-10-CM | POA: Diagnosis not present

## 2018-01-15 DIAGNOSIS — E119 Type 2 diabetes mellitus without complications: Secondary | ICD-10-CM | POA: Diagnosis not present

## 2018-02-02 DIAGNOSIS — Z6838 Body mass index (BMI) 38.0-38.9, adult: Secondary | ICD-10-CM | POA: Diagnosis not present

## 2018-02-02 DIAGNOSIS — I10 Essential (primary) hypertension: Secondary | ICD-10-CM | POA: Diagnosis not present

## 2018-02-02 DIAGNOSIS — E039 Hypothyroidism, unspecified: Secondary | ICD-10-CM | POA: Diagnosis not present

## 2018-02-02 DIAGNOSIS — G35 Multiple sclerosis: Secondary | ICD-10-CM | POA: Diagnosis not present

## 2018-02-02 DIAGNOSIS — E119 Type 2 diabetes mellitus without complications: Secondary | ICD-10-CM | POA: Diagnosis not present

## 2018-02-05 DIAGNOSIS — G8929 Other chronic pain: Secondary | ICD-10-CM | POA: Diagnosis not present

## 2018-02-05 DIAGNOSIS — M7541 Impingement syndrome of right shoulder: Secondary | ICD-10-CM | POA: Diagnosis not present

## 2018-02-05 DIAGNOSIS — M1711 Unilateral primary osteoarthritis, right knee: Secondary | ICD-10-CM | POA: Diagnosis not present

## 2018-02-09 DIAGNOSIS — E1165 Type 2 diabetes mellitus with hyperglycemia: Secondary | ICD-10-CM | POA: Diagnosis not present

## 2018-02-09 DIAGNOSIS — Z7952 Long term (current) use of systemic steroids: Secondary | ICD-10-CM | POA: Diagnosis not present

## 2018-03-10 DIAGNOSIS — M5416 Radiculopathy, lumbar region: Secondary | ICD-10-CM | POA: Diagnosis not present

## 2018-03-17 DIAGNOSIS — M47817 Spondylosis without myelopathy or radiculopathy, lumbosacral region: Secondary | ICD-10-CM | POA: Diagnosis not present

## 2018-03-23 DIAGNOSIS — G35 Multiple sclerosis: Secondary | ICD-10-CM | POA: Diagnosis not present

## 2018-03-23 DIAGNOSIS — H538 Other visual disturbances: Secondary | ICD-10-CM | POA: Diagnosis not present

## 2018-03-23 DIAGNOSIS — G4733 Obstructive sleep apnea (adult) (pediatric): Secondary | ICD-10-CM | POA: Diagnosis not present

## 2018-03-23 DIAGNOSIS — R269 Unspecified abnormalities of gait and mobility: Secondary | ICD-10-CM | POA: Diagnosis not present

## 2018-04-07 DIAGNOSIS — L280 Lichen simplex chronicus: Secondary | ICD-10-CM | POA: Diagnosis not present

## 2018-04-07 DIAGNOSIS — L720 Epidermal cyst: Secondary | ICD-10-CM | POA: Diagnosis not present

## 2018-04-16 DIAGNOSIS — D3131 Benign neoplasm of right choroid: Secondary | ICD-10-CM | POA: Diagnosis not present

## 2018-05-12 DIAGNOSIS — E039 Hypothyroidism, unspecified: Secondary | ICD-10-CM | POA: Diagnosis not present

## 2018-05-12 DIAGNOSIS — I10 Essential (primary) hypertension: Secondary | ICD-10-CM | POA: Diagnosis not present

## 2018-05-12 DIAGNOSIS — E119 Type 2 diabetes mellitus without complications: Secondary | ICD-10-CM | POA: Diagnosis not present

## 2018-05-12 DIAGNOSIS — Z0000 Encounter for general adult medical examination without abnormal findings: Secondary | ICD-10-CM | POA: Diagnosis not present

## 2018-05-12 DIAGNOSIS — Z79899 Other long term (current) drug therapy: Secondary | ICD-10-CM | POA: Diagnosis not present

## 2018-05-12 DIAGNOSIS — G35 Multiple sclerosis: Secondary | ICD-10-CM | POA: Diagnosis not present

## 2018-05-12 DIAGNOSIS — J449 Chronic obstructive pulmonary disease, unspecified: Secondary | ICD-10-CM | POA: Diagnosis not present

## 2018-05-12 DIAGNOSIS — Z1211 Encounter for screening for malignant neoplasm of colon: Secondary | ICD-10-CM | POA: Diagnosis not present

## 2018-05-12 DIAGNOSIS — E7800 Pure hypercholesterolemia, unspecified: Secondary | ICD-10-CM | POA: Diagnosis not present

## 2018-05-12 DIAGNOSIS — J431 Panlobular emphysema: Secondary | ICD-10-CM | POA: Diagnosis not present

## 2018-05-17 DIAGNOSIS — Z1211 Encounter for screening for malignant neoplasm of colon: Secondary | ICD-10-CM | POA: Diagnosis not present

## 2018-05-18 DIAGNOSIS — G35 Multiple sclerosis: Secondary | ICD-10-CM | POA: Diagnosis not present

## 2018-05-18 DIAGNOSIS — H538 Other visual disturbances: Secondary | ICD-10-CM | POA: Diagnosis not present

## 2018-05-18 DIAGNOSIS — R269 Unspecified abnormalities of gait and mobility: Secondary | ICD-10-CM | POA: Diagnosis not present

## 2018-05-18 DIAGNOSIS — G4733 Obstructive sleep apnea (adult) (pediatric): Secondary | ICD-10-CM | POA: Diagnosis not present

## 2018-05-25 DIAGNOSIS — L723 Sebaceous cyst: Secondary | ICD-10-CM | POA: Diagnosis not present

## 2018-05-25 DIAGNOSIS — L720 Epidermal cyst: Secondary | ICD-10-CM | POA: Diagnosis not present

## 2018-06-04 DIAGNOSIS — M7541 Impingement syndrome of right shoulder: Secondary | ICD-10-CM | POA: Diagnosis not present

## 2018-06-08 DIAGNOSIS — I10 Essential (primary) hypertension: Secondary | ICD-10-CM | POA: Diagnosis not present

## 2018-06-08 DIAGNOSIS — E7800 Pure hypercholesterolemia, unspecified: Secondary | ICD-10-CM | POA: Diagnosis not present

## 2018-06-08 DIAGNOSIS — E1165 Type 2 diabetes mellitus with hyperglycemia: Secondary | ICD-10-CM | POA: Diagnosis not present

## 2018-06-08 DIAGNOSIS — E039 Hypothyroidism, unspecified: Secondary | ICD-10-CM | POA: Diagnosis not present

## 2018-06-08 DIAGNOSIS — Z79899 Other long term (current) drug therapy: Secondary | ICD-10-CM | POA: Diagnosis not present

## 2018-06-15 DIAGNOSIS — E11649 Type 2 diabetes mellitus with hypoglycemia without coma: Secondary | ICD-10-CM | POA: Diagnosis not present

## 2018-06-15 DIAGNOSIS — G35 Multiple sclerosis: Secondary | ICD-10-CM | POA: Diagnosis not present

## 2018-06-15 DIAGNOSIS — Z7952 Long term (current) use of systemic steroids: Secondary | ICD-10-CM | POA: Diagnosis not present

## 2018-06-15 DIAGNOSIS — G4733 Obstructive sleep apnea (adult) (pediatric): Secondary | ICD-10-CM | POA: Diagnosis not present

## 2018-06-15 DIAGNOSIS — Z79899 Other long term (current) drug therapy: Secondary | ICD-10-CM | POA: Diagnosis not present

## 2018-06-15 DIAGNOSIS — R269 Unspecified abnormalities of gait and mobility: Secondary | ICD-10-CM | POA: Diagnosis not present

## 2018-06-17 DIAGNOSIS — J439 Emphysema, unspecified: Secondary | ICD-10-CM | POA: Diagnosis not present

## 2018-06-17 DIAGNOSIS — G35 Multiple sclerosis: Secondary | ICD-10-CM | POA: Diagnosis not present

## 2018-06-17 DIAGNOSIS — R0602 Shortness of breath: Secondary | ICD-10-CM | POA: Diagnosis not present

## 2018-06-17 DIAGNOSIS — G4733 Obstructive sleep apnea (adult) (pediatric): Secondary | ICD-10-CM | POA: Diagnosis not present

## 2018-06-28 DIAGNOSIS — G4733 Obstructive sleep apnea (adult) (pediatric): Secondary | ICD-10-CM | POA: Diagnosis not present

## 2018-07-07 DIAGNOSIS — G35 Multiple sclerosis: Secondary | ICD-10-CM | POA: Diagnosis not present

## 2018-07-19 DIAGNOSIS — M1811 Unilateral primary osteoarthritis of first carpometacarpal joint, right hand: Secondary | ICD-10-CM | POA: Diagnosis not present

## 2018-07-19 DIAGNOSIS — M7541 Impingement syndrome of right shoulder: Secondary | ICD-10-CM | POA: Diagnosis not present

## 2018-08-11 DIAGNOSIS — Z5181 Encounter for therapeutic drug level monitoring: Secondary | ICD-10-CM | POA: Diagnosis not present

## 2018-08-11 DIAGNOSIS — G35 Multiple sclerosis: Secondary | ICD-10-CM | POA: Diagnosis not present

## 2018-08-11 DIAGNOSIS — I272 Pulmonary hypertension, unspecified: Secondary | ICD-10-CM | POA: Diagnosis not present

## 2018-08-11 DIAGNOSIS — R0609 Other forms of dyspnea: Secondary | ICD-10-CM | POA: Diagnosis not present

## 2018-08-27 ENCOUNTER — Other Ambulatory Visit: Payer: Self-pay | Admitting: Internal Medicine

## 2018-08-27 DIAGNOSIS — E119 Type 2 diabetes mellitus without complications: Secondary | ICD-10-CM | POA: Diagnosis not present

## 2018-08-27 DIAGNOSIS — E039 Hypothyroidism, unspecified: Secondary | ICD-10-CM | POA: Diagnosis not present

## 2018-08-27 DIAGNOSIS — Z79899 Other long term (current) drug therapy: Secondary | ICD-10-CM | POA: Diagnosis not present

## 2018-08-27 DIAGNOSIS — G35 Multiple sclerosis: Secondary | ICD-10-CM | POA: Diagnosis not present

## 2018-08-27 DIAGNOSIS — Z0000 Encounter for general adult medical examination without abnormal findings: Secondary | ICD-10-CM | POA: Diagnosis not present

## 2018-08-27 DIAGNOSIS — E7800 Pure hypercholesterolemia, unspecified: Secondary | ICD-10-CM | POA: Diagnosis not present

## 2018-08-27 DIAGNOSIS — Z1239 Encounter for other screening for malignant neoplasm of breast: Secondary | ICD-10-CM | POA: Diagnosis not present

## 2018-08-27 DIAGNOSIS — J439 Emphysema, unspecified: Secondary | ICD-10-CM | POA: Diagnosis not present

## 2018-08-27 DIAGNOSIS — Z1231 Encounter for screening mammogram for malignant neoplasm of breast: Secondary | ICD-10-CM

## 2018-08-27 DIAGNOSIS — I10 Essential (primary) hypertension: Secondary | ICD-10-CM | POA: Diagnosis not present

## 2018-08-27 DIAGNOSIS — R0609 Other forms of dyspnea: Secondary | ICD-10-CM | POA: Diagnosis not present

## 2018-09-09 DIAGNOSIS — R0609 Other forms of dyspnea: Secondary | ICD-10-CM | POA: Diagnosis not present

## 2018-09-15 DIAGNOSIS — R2681 Unsteadiness on feet: Secondary | ICD-10-CM | POA: Diagnosis not present

## 2018-09-15 DIAGNOSIS — G35 Multiple sclerosis: Secondary | ICD-10-CM | POA: Diagnosis not present

## 2018-09-15 DIAGNOSIS — R262 Difficulty in walking, not elsewhere classified: Secondary | ICD-10-CM | POA: Diagnosis not present

## 2018-09-15 DIAGNOSIS — R531 Weakness: Secondary | ICD-10-CM | POA: Diagnosis not present

## 2018-09-15 DIAGNOSIS — Z79899 Other long term (current) drug therapy: Secondary | ICD-10-CM | POA: Diagnosis not present

## 2018-09-20 ENCOUNTER — Ambulatory Visit
Admission: RE | Admit: 2018-09-20 | Discharge: 2018-09-20 | Disposition: A | Discharge status: Home / Self Care | Payer: PPO | Source: Ambulatory Visit | Attending: Neurology | Admitting: Neurology

## 2018-09-20 ENCOUNTER — Other Ambulatory Visit: Payer: Self-pay | Admitting: Neurology

## 2018-09-20 DIAGNOSIS — M47812 Spondylosis without myelopathy or radiculopathy, cervical region: Secondary | ICD-10-CM | POA: Diagnosis not present

## 2018-09-20 DIAGNOSIS — G35 Multiple sclerosis: Secondary | ICD-10-CM

## 2018-09-20 DIAGNOSIS — M4802 Spinal stenosis, cervical region: Secondary | ICD-10-CM | POA: Diagnosis not present

## 2018-09-20 LAB — POCT I-STAT CREATININE: Creatinine, Ser: 0.7 mg/dL (ref 0.44–1.00)

## 2018-09-20 MED ORDER — GADOBUTROL 1 MMOL/ML IV SOLN
9.0000 mL | Freq: Once | INTRAVENOUS | Status: AC | PRN
  Administered 2018-09-20: 9 mL via INTRAVENOUS

## 2018-09-23 DIAGNOSIS — Z7952 Long term (current) use of systemic steroids: Secondary | ICD-10-CM | POA: Diagnosis not present

## 2018-09-23 DIAGNOSIS — K219 Gastro-esophageal reflux disease without esophagitis: Secondary | ICD-10-CM | POA: Diagnosis not present

## 2018-09-23 DIAGNOSIS — M810 Age-related osteoporosis without current pathological fracture: Secondary | ICD-10-CM | POA: Diagnosis not present

## 2018-09-23 DIAGNOSIS — E119 Type 2 diabetes mellitus without complications: Secondary | ICD-10-CM | POA: Diagnosis not present

## 2018-09-23 DIAGNOSIS — J449 Chronic obstructive pulmonary disease, unspecified: Secondary | ICD-10-CM | POA: Diagnosis not present

## 2018-09-23 DIAGNOSIS — Z7984 Long term (current) use of oral hypoglycemic drugs: Secondary | ICD-10-CM | POA: Diagnosis not present

## 2018-09-23 DIAGNOSIS — Z6838 Body mass index (BMI) 38.0-38.9, adult: Secondary | ICD-10-CM | POA: Diagnosis not present

## 2018-09-23 DIAGNOSIS — I10 Essential (primary) hypertension: Secondary | ICD-10-CM | POA: Diagnosis not present

## 2018-09-23 DIAGNOSIS — G4733 Obstructive sleep apnea (adult) (pediatric): Secondary | ICD-10-CM | POA: Diagnosis not present

## 2018-09-23 DIAGNOSIS — Z96652 Presence of left artificial knee joint: Secondary | ICD-10-CM | POA: Diagnosis not present

## 2018-09-23 DIAGNOSIS — G35 Multiple sclerosis: Secondary | ICD-10-CM | POA: Diagnosis not present

## 2018-09-23 DIAGNOSIS — E039 Hypothyroidism, unspecified: Secondary | ICD-10-CM | POA: Diagnosis not present

## 2018-09-23 DIAGNOSIS — E669 Obesity, unspecified: Secondary | ICD-10-CM | POA: Diagnosis not present

## 2018-09-23 DIAGNOSIS — Z87891 Personal history of nicotine dependence: Secondary | ICD-10-CM | POA: Diagnosis not present

## 2018-09-23 DIAGNOSIS — F329 Major depressive disorder, single episode, unspecified: Secondary | ICD-10-CM | POA: Diagnosis not present

## 2018-09-27 DIAGNOSIS — Z6838 Body mass index (BMI) 38.0-38.9, adult: Secondary | ICD-10-CM | POA: Diagnosis not present

## 2018-09-27 DIAGNOSIS — E039 Hypothyroidism, unspecified: Secondary | ICD-10-CM | POA: Diagnosis not present

## 2018-09-27 DIAGNOSIS — I10 Essential (primary) hypertension: Secondary | ICD-10-CM | POA: Diagnosis not present

## 2018-09-27 DIAGNOSIS — E669 Obesity, unspecified: Secondary | ICD-10-CM | POA: Diagnosis not present

## 2018-09-27 DIAGNOSIS — G4733 Obstructive sleep apnea (adult) (pediatric): Secondary | ICD-10-CM | POA: Diagnosis not present

## 2018-09-27 DIAGNOSIS — Z96652 Presence of left artificial knee joint: Secondary | ICD-10-CM | POA: Diagnosis not present

## 2018-09-27 DIAGNOSIS — G35 Multiple sclerosis: Secondary | ICD-10-CM | POA: Diagnosis not present

## 2018-09-27 DIAGNOSIS — Z7952 Long term (current) use of systemic steroids: Secondary | ICD-10-CM | POA: Diagnosis not present

## 2018-09-27 DIAGNOSIS — E119 Type 2 diabetes mellitus without complications: Secondary | ICD-10-CM | POA: Diagnosis not present

## 2018-09-27 DIAGNOSIS — M810 Age-related osteoporosis without current pathological fracture: Secondary | ICD-10-CM | POA: Diagnosis not present

## 2018-09-27 DIAGNOSIS — Z7984 Long term (current) use of oral hypoglycemic drugs: Secondary | ICD-10-CM | POA: Diagnosis not present

## 2018-09-27 DIAGNOSIS — J449 Chronic obstructive pulmonary disease, unspecified: Secondary | ICD-10-CM | POA: Diagnosis not present

## 2018-09-27 DIAGNOSIS — F329 Major depressive disorder, single episode, unspecified: Secondary | ICD-10-CM | POA: Diagnosis not present

## 2018-09-27 DIAGNOSIS — Z87891 Personal history of nicotine dependence: Secondary | ICD-10-CM | POA: Diagnosis not present

## 2018-09-27 DIAGNOSIS — K219 Gastro-esophageal reflux disease without esophagitis: Secondary | ICD-10-CM | POA: Diagnosis not present

## 2018-10-01 DIAGNOSIS — E119 Type 2 diabetes mellitus without complications: Secondary | ICD-10-CM | POA: Diagnosis not present

## 2018-10-01 DIAGNOSIS — J449 Chronic obstructive pulmonary disease, unspecified: Secondary | ICD-10-CM | POA: Diagnosis not present

## 2018-10-01 DIAGNOSIS — G35 Multiple sclerosis: Secondary | ICD-10-CM | POA: Diagnosis not present

## 2018-10-01 DIAGNOSIS — G4733 Obstructive sleep apnea (adult) (pediatric): Secondary | ICD-10-CM | POA: Diagnosis not present

## 2018-10-01 DIAGNOSIS — B370 Candidal stomatitis: Secondary | ICD-10-CM | POA: Diagnosis not present

## 2018-10-01 DIAGNOSIS — K219 Gastro-esophageal reflux disease without esophagitis: Secondary | ICD-10-CM | POA: Diagnosis not present

## 2018-10-01 DIAGNOSIS — R5383 Other fatigue: Secondary | ICD-10-CM | POA: Diagnosis not present

## 2018-10-01 DIAGNOSIS — E039 Hypothyroidism, unspecified: Secondary | ICD-10-CM | POA: Diagnosis not present

## 2018-10-01 DIAGNOSIS — R0602 Shortness of breath: Secondary | ICD-10-CM | POA: Diagnosis not present

## 2018-10-01 DIAGNOSIS — M810 Age-related osteoporosis without current pathological fracture: Secondary | ICD-10-CM | POA: Diagnosis not present

## 2018-10-01 DIAGNOSIS — I10 Essential (primary) hypertension: Secondary | ICD-10-CM | POA: Diagnosis not present

## 2018-10-12 DIAGNOSIS — E11649 Type 2 diabetes mellitus with hypoglycemia without coma: Secondary | ICD-10-CM | POA: Diagnosis not present

## 2018-10-12 DIAGNOSIS — I10 Essential (primary) hypertension: Secondary | ICD-10-CM | POA: Diagnosis not present

## 2018-10-12 DIAGNOSIS — Z79899 Other long term (current) drug therapy: Secondary | ICD-10-CM | POA: Diagnosis not present

## 2018-10-19 DIAGNOSIS — Z7952 Long term (current) use of systemic steroids: Secondary | ICD-10-CM | POA: Diagnosis not present

## 2018-10-19 DIAGNOSIS — Z794 Long term (current) use of insulin: Secondary | ICD-10-CM | POA: Diagnosis not present

## 2018-10-19 DIAGNOSIS — M1711 Unilateral primary osteoarthritis, right knee: Secondary | ICD-10-CM | POA: Diagnosis not present

## 2018-10-19 DIAGNOSIS — E1165 Type 2 diabetes mellitus with hyperglycemia: Secondary | ICD-10-CM | POA: Diagnosis not present

## 2018-10-20 DIAGNOSIS — G4733 Obstructive sleep apnea (adult) (pediatric): Secondary | ICD-10-CM | POA: Diagnosis not present

## 2018-10-26 DIAGNOSIS — L905 Scar conditions and fibrosis of skin: Secondary | ICD-10-CM | POA: Diagnosis not present

## 2018-10-26 DIAGNOSIS — D485 Neoplasm of uncertain behavior of skin: Secondary | ICD-10-CM | POA: Diagnosis not present

## 2018-10-26 DIAGNOSIS — L720 Epidermal cyst: Secondary | ICD-10-CM | POA: Diagnosis not present

## 2018-10-26 DIAGNOSIS — L928 Other granulomatous disorders of the skin and subcutaneous tissue: Secondary | ICD-10-CM | POA: Diagnosis not present

## 2018-11-08 DIAGNOSIS — G35 Multiple sclerosis: Secondary | ICD-10-CM | POA: Diagnosis not present

## 2018-11-08 DIAGNOSIS — R269 Unspecified abnormalities of gait and mobility: Secondary | ICD-10-CM | POA: Diagnosis not present

## 2018-11-08 DIAGNOSIS — G4733 Obstructive sleep apnea (adult) (pediatric): Secondary | ICD-10-CM | POA: Diagnosis not present

## 2018-11-12 ENCOUNTER — Ambulatory Visit
Admission: RE | Admit: 2018-11-12 | Discharge: 2018-11-12 | Disposition: A | Discharge status: Home / Self Care | Payer: PPO | Source: Ambulatory Visit | Attending: Internal Medicine | Admitting: Internal Medicine

## 2018-11-12 DIAGNOSIS — Z1231 Encounter for screening mammogram for malignant neoplasm of breast: Secondary | ICD-10-CM | POA: Insufficient documentation

## 2018-11-22 ENCOUNTER — Other Ambulatory Visit: Payer: Self-pay | Admitting: Internal Medicine

## 2018-11-22 DIAGNOSIS — N6489 Other specified disorders of breast: Secondary | ICD-10-CM

## 2018-11-22 DIAGNOSIS — R928 Other abnormal and inconclusive findings on diagnostic imaging of breast: Secondary | ICD-10-CM

## 2018-11-25 ENCOUNTER — Ambulatory Visit
Admission: RE | Admit: 2018-11-25 | Discharge: 2018-11-25 | Disposition: A | Discharge status: Home / Self Care | Payer: PPO | Source: Ambulatory Visit | Attending: Internal Medicine | Admitting: Internal Medicine

## 2018-11-25 DIAGNOSIS — R928 Other abnormal and inconclusive findings on diagnostic imaging of breast: Secondary | ICD-10-CM | POA: Diagnosis not present

## 2018-11-25 DIAGNOSIS — N6002 Solitary cyst of left breast: Secondary | ICD-10-CM | POA: Diagnosis not present

## 2018-11-25 DIAGNOSIS — N6489 Other specified disorders of breast: Secondary | ICD-10-CM

## 2018-12-03 DIAGNOSIS — R300 Dysuria: Secondary | ICD-10-CM | POA: Diagnosis not present

## 2018-12-03 DIAGNOSIS — R8281 Pyuria: Secondary | ICD-10-CM | POA: Diagnosis not present

## 2018-12-08 DIAGNOSIS — M1711 Unilateral primary osteoarthritis, right knee: Secondary | ICD-10-CM | POA: Diagnosis not present

## 2018-12-10 DIAGNOSIS — R310 Gross hematuria: Secondary | ICD-10-CM | POA: Diagnosis not present

## 2018-12-10 DIAGNOSIS — I10 Essential (primary) hypertension: Secondary | ICD-10-CM | POA: Diagnosis not present

## 2018-12-13 ENCOUNTER — Other Ambulatory Visit: Payer: Self-pay | Admitting: Internal Medicine

## 2018-12-13 DIAGNOSIS — R310 Gross hematuria: Secondary | ICD-10-CM

## 2018-12-16 DIAGNOSIS — G35 Multiple sclerosis: Secondary | ICD-10-CM | POA: Diagnosis not present

## 2018-12-16 DIAGNOSIS — J439 Emphysema, unspecified: Secondary | ICD-10-CM | POA: Diagnosis not present

## 2018-12-16 DIAGNOSIS — Z720 Tobacco use: Secondary | ICD-10-CM | POA: Diagnosis not present

## 2018-12-21 ENCOUNTER — Ambulatory Visit
Admission: RE | Admit: 2018-12-21 | Discharge: 2018-12-21 | Disposition: A | Discharge status: Home / Self Care | Payer: PPO | Source: Ambulatory Visit | Attending: Internal Medicine | Admitting: Internal Medicine

## 2018-12-21 DIAGNOSIS — R310 Gross hematuria: Secondary | ICD-10-CM

## 2019-01-26 DIAGNOSIS — E1165 Type 2 diabetes mellitus with hyperglycemia: Secondary | ICD-10-CM | POA: Diagnosis not present

## 2019-02-02 DIAGNOSIS — E119 Type 2 diabetes mellitus without complications: Secondary | ICD-10-CM | POA: Diagnosis not present

## 2019-02-02 DIAGNOSIS — Z7952 Long term (current) use of systemic steroids: Secondary | ICD-10-CM | POA: Diagnosis not present

## 2019-02-02 DIAGNOSIS — Z794 Long term (current) use of insulin: Secondary | ICD-10-CM | POA: Diagnosis not present

## 2019-02-02 IMAGING — MG DIGITAL DIAGNOSTIC UNILATERAL LEFT MAMMOGRAM WITH TOMO AND CAD
6 of 10 series · 6 of 30 positions shown · non-contrast
Comparison: Previous exam(s).

CLINICAL DATA: Left inferior breast asymmetry seen on most recent
screening mammography.

EXAM:
DIGITAL DIAGNOSTIC LEFT MAMMOGRAM WITH CAD AND TOMO
ULTRASOUND LEFT BREAST

[L TAN synth-2D]
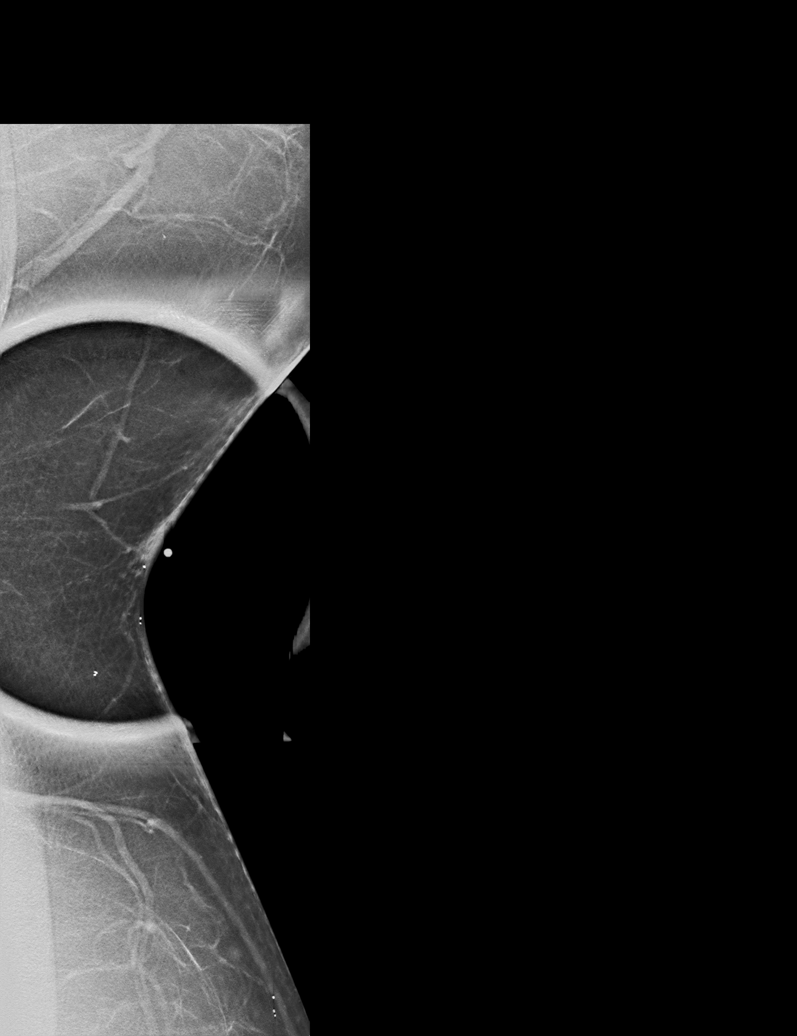

[L MLO synth-2D (1 of 2)]
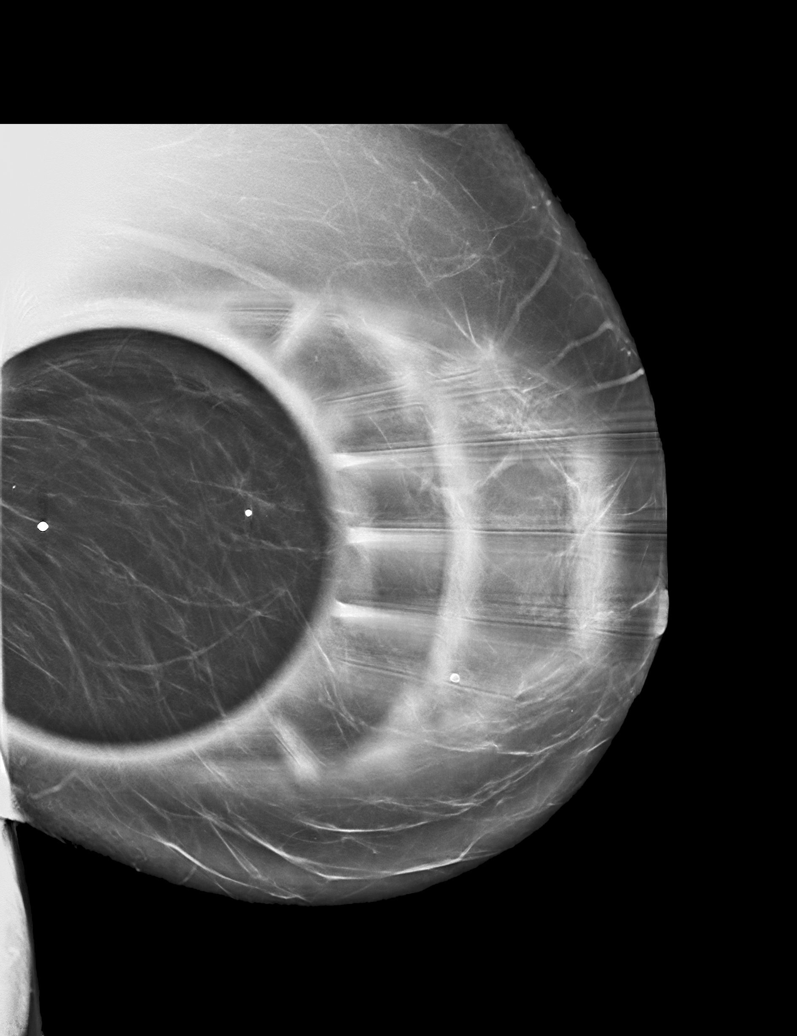

[L ML synth-2D]
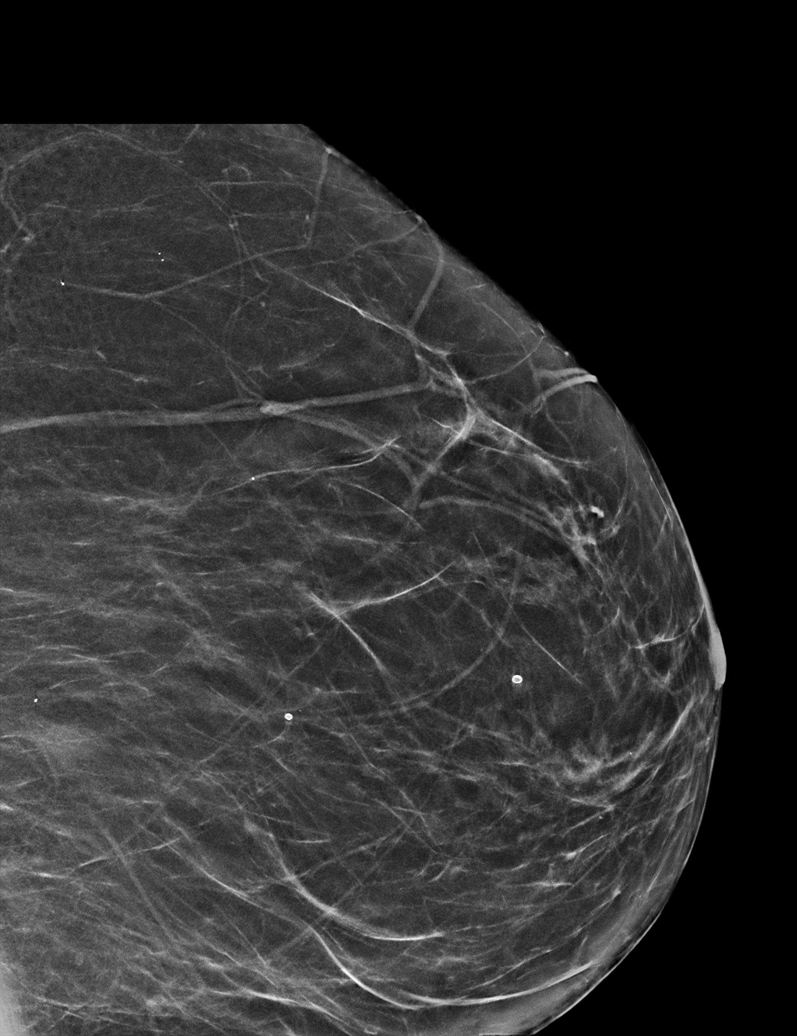

[L CV synth-2D]
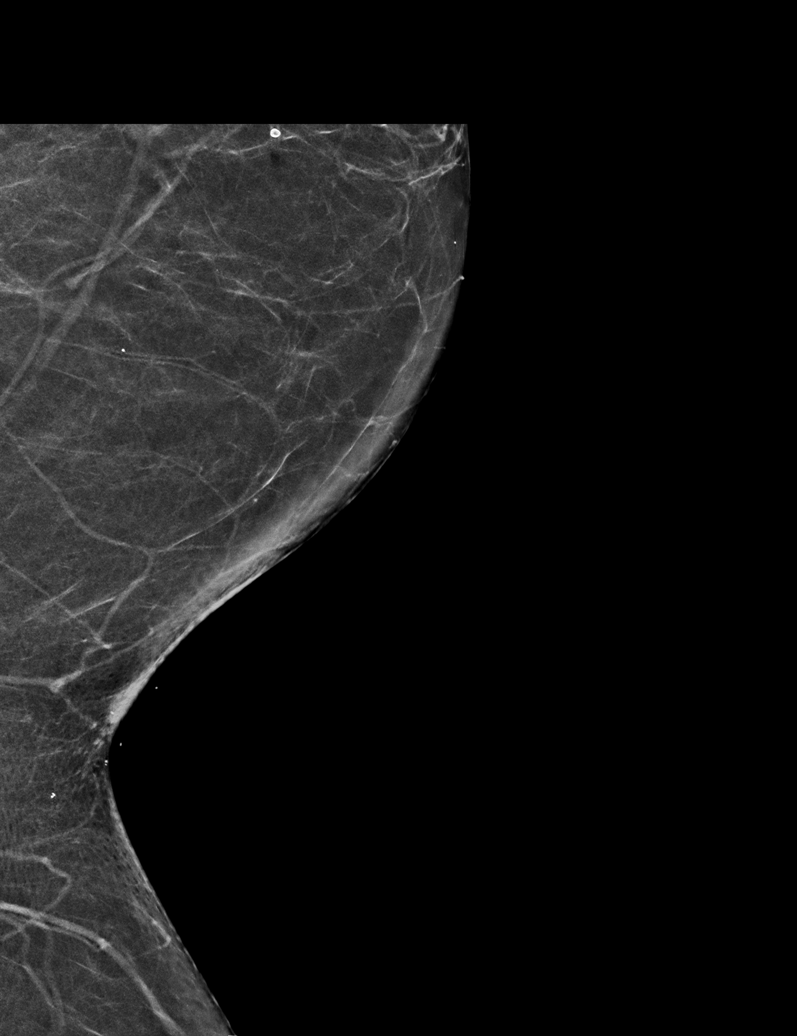

[L MLO synth-2D (2 of 2)]
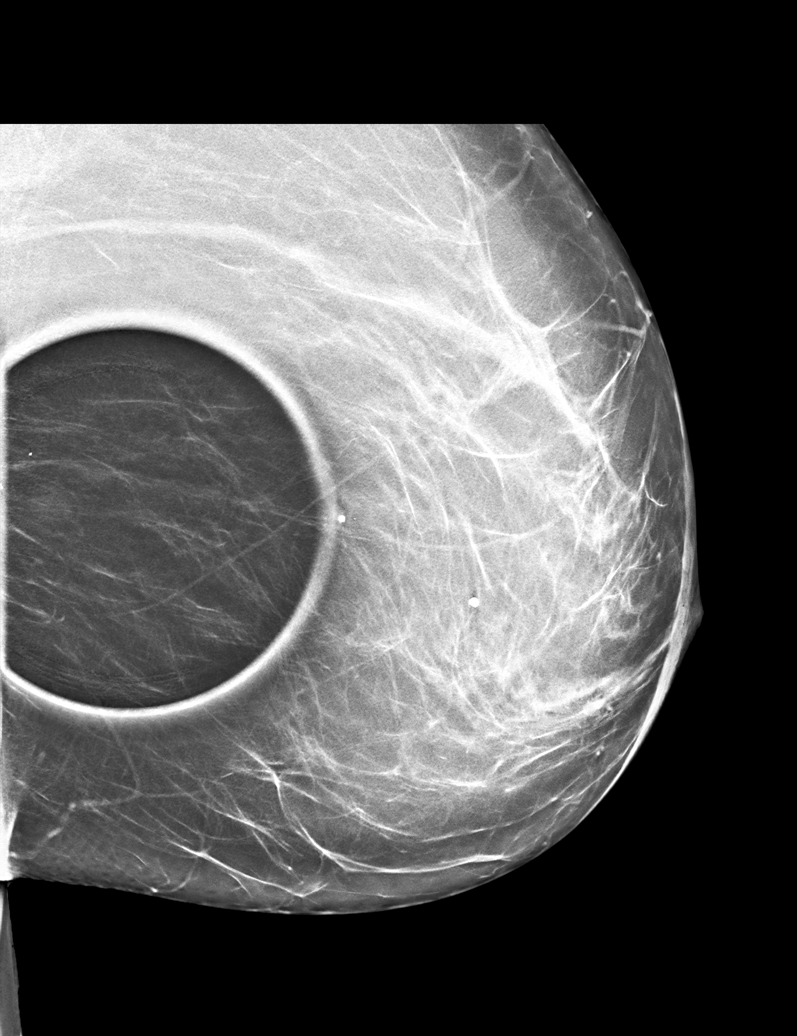

[L CV tomo · tomo slice 29/56.0]
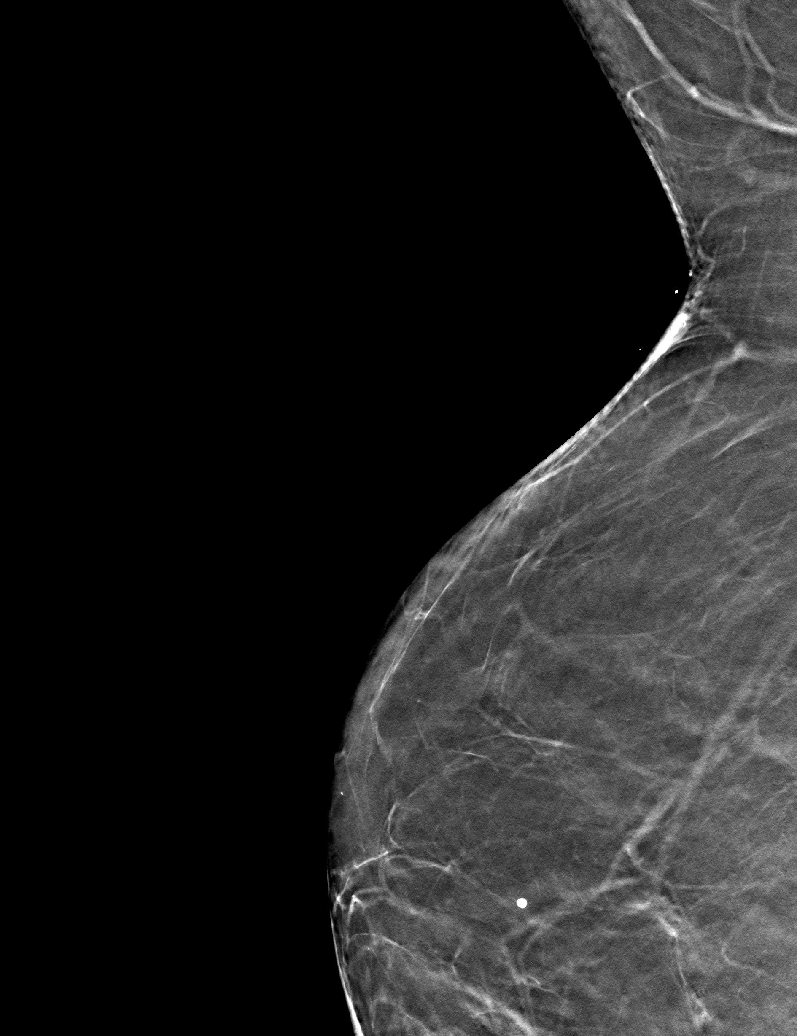

[6 of 30 positions shown; findings below may reference images not displayed]

ACR Breast Density Category b: There are scattered areas of
fibroglandular density.
FINDINGS: Mammographically, there are no suspicious masses or areas of
architectural distortion in the left breast. The previously seen
asymmetry in the inferior left breast, posterior depth, corresponds
to a skin partial, which has ruptured since patient's screening
mammogram, per patient's report. This was documented by placing a BB
marker on the area and taking and MLO image.

Mammographic images were processed with CAD.

On physical exam, raised reddish papule is seen in the left breast
slightly lower inner quadrant, far posterior depth.

Targeted ultrasound is performed, showing left breast 9 o'clock 15
cm from the nipple intradermal hypoechoic circumscribed phlegmon
collection measuring 1.1 x 0.8 x 0.3 cm. This finding likely
represents benign epidermal inclusion cyst and corresponds to the
mammographically seen abnormality.
IMPRESSION: No mammographic or sonographic evidence of malignancy in the left
breast.

Left breast 9 o'clock benign-appearing epidermal inclusion cyst, for
which dermatology consultation may be considered.

RECOMMENDATION:
Screening mammogram in one year.(Code:KK-H-C95)

I have discussed the findings and recommendations with the patient.
Results were also provided in writing at the conclusion of the
visit. If applicable, a reminder letter will be sent to the patient
regarding the next appointment.

BI-RADS CATEGORY  2: Benign.

## 2019-02-02 IMAGING — US ULTRASOUND LEFT BREAST LIMITED
1 series · 6 of 6 positions shown · non-contrast
Comparison: Previous exam(s).

CLINICAL DATA: Left inferior breast asymmetry seen on most recent
screening mammography.

EXAM:
DIGITAL DIAGNOSTIC LEFT MAMMOGRAM WITH CAD AND TOMO
ULTRASOUND LEFT BREAST

[Series 1: ultrasound left breast limited · 0.04mm/px · 6 of 6 slices shown]
[im 1/6]
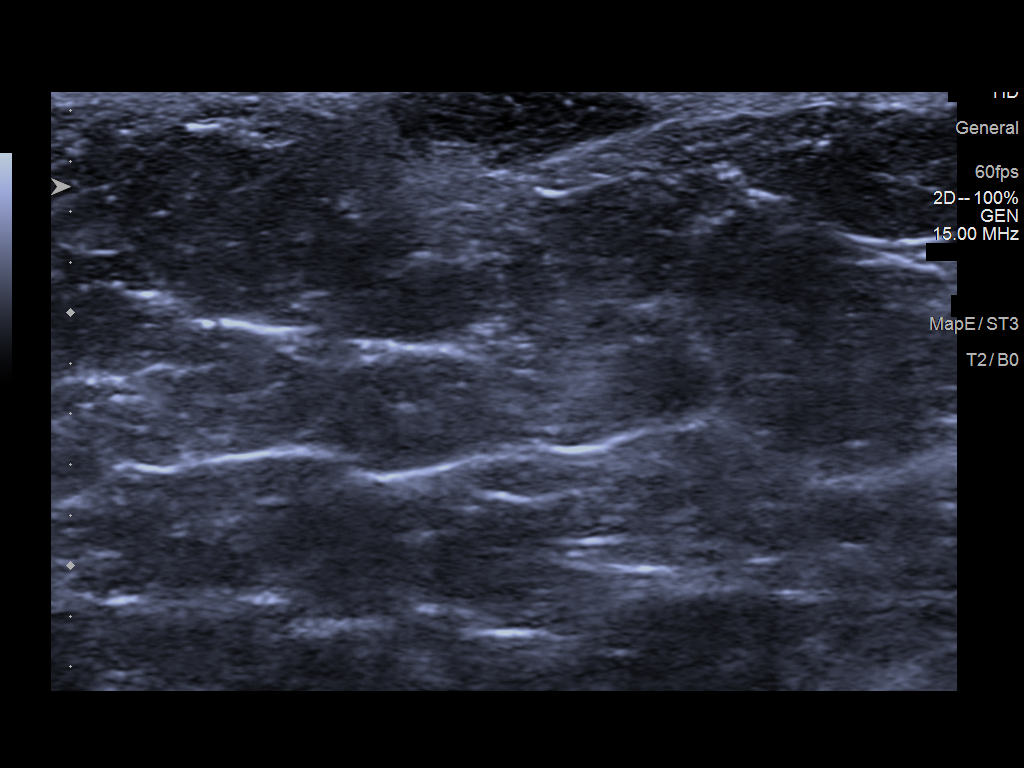
[im 2/6]
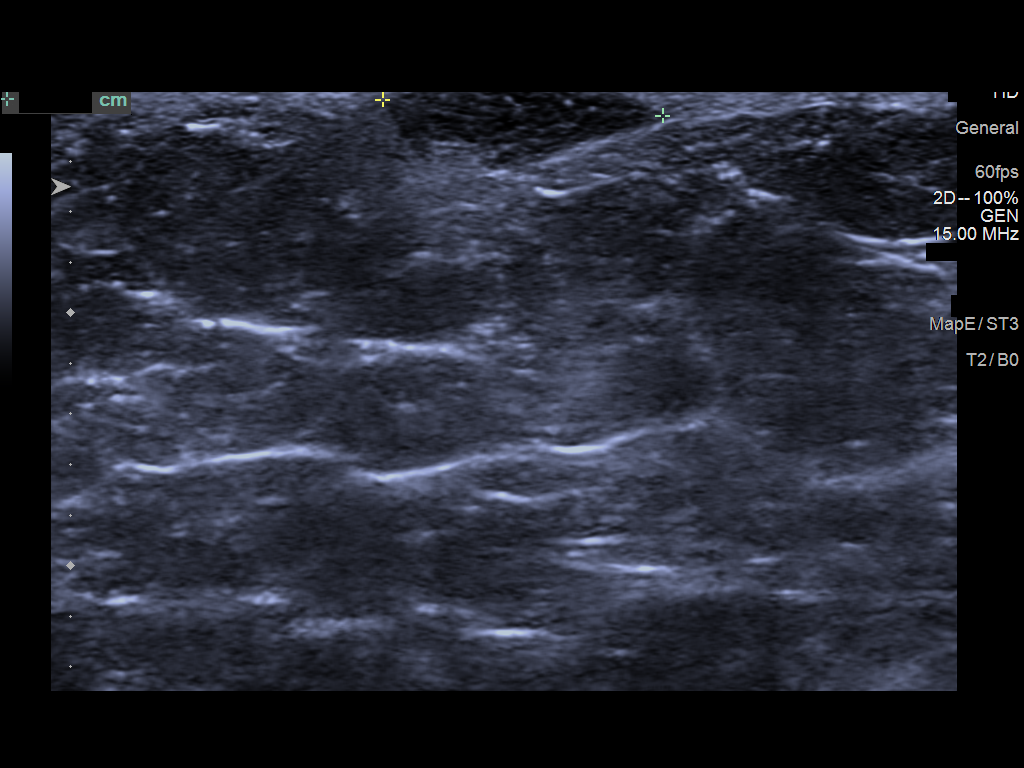
[im 3/6]
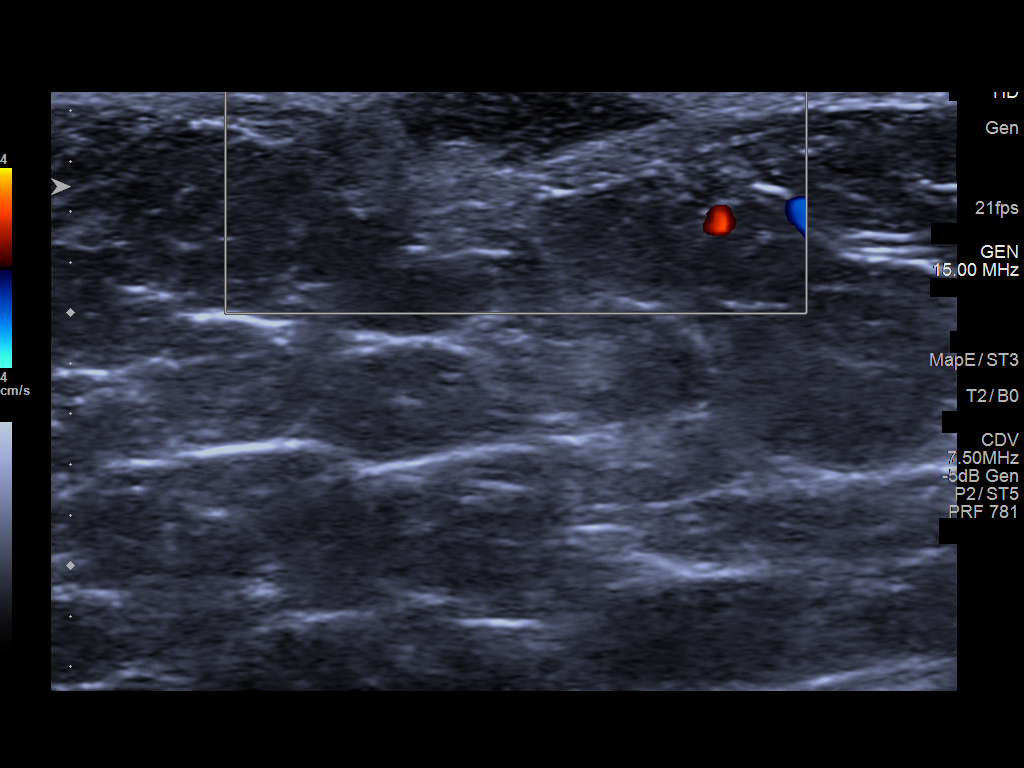
[im 4/6]
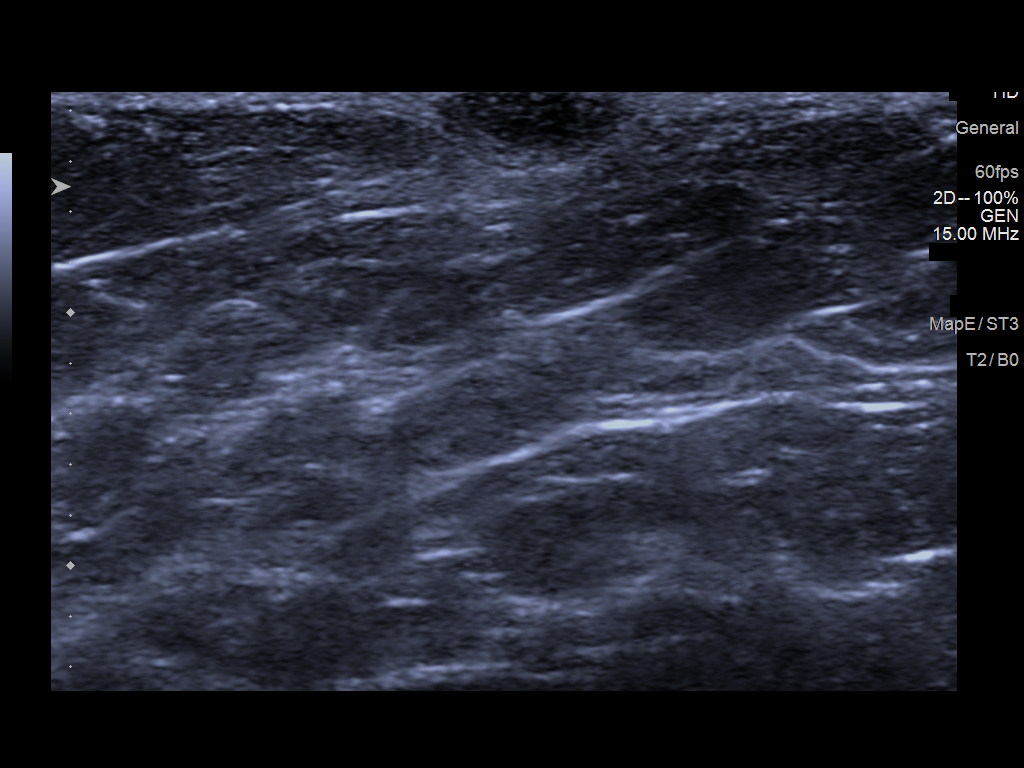
[im 5/6]
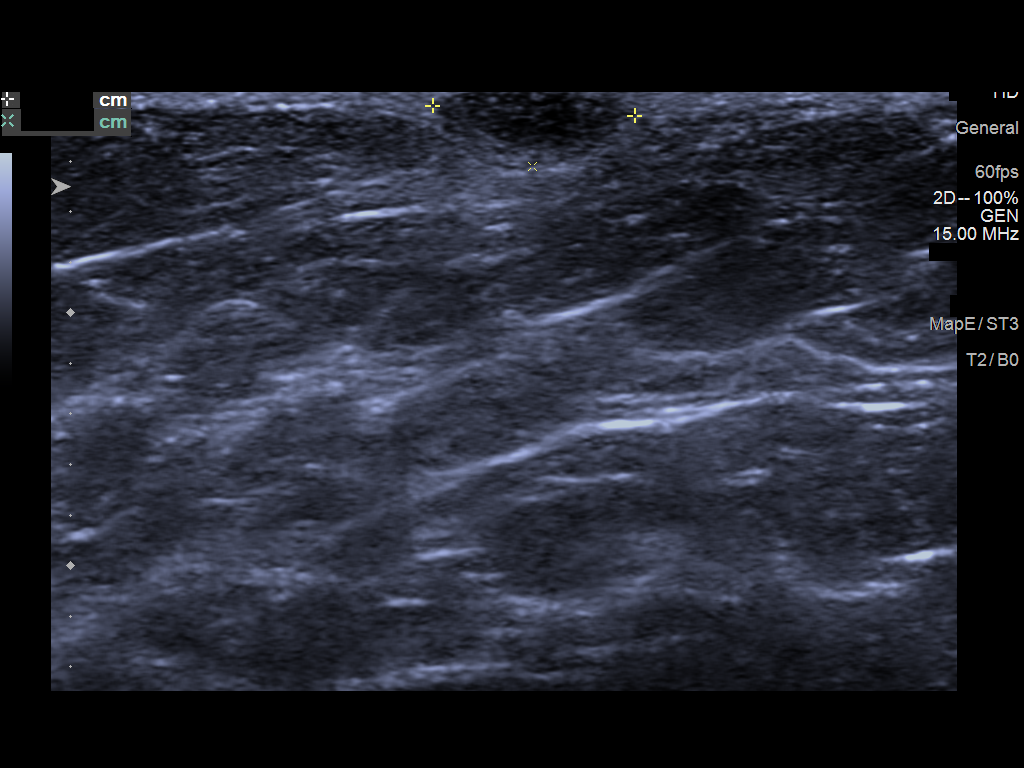
[im 6/6]
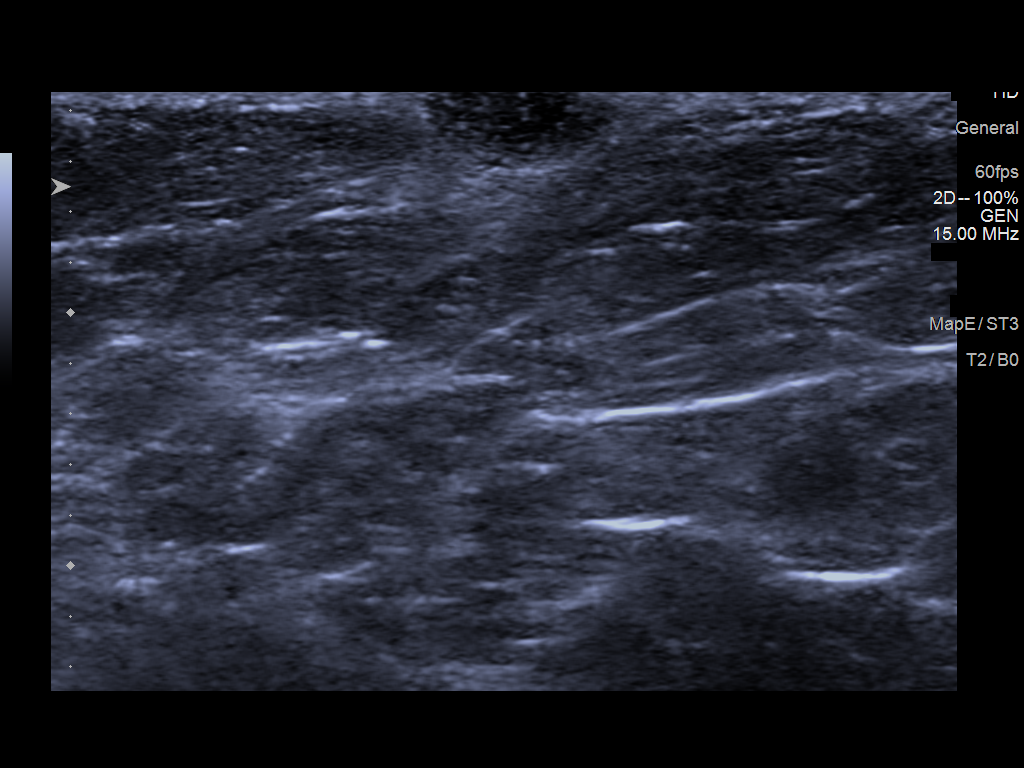

[6 of 6 positions shown; findings below may reference images not displayed]

ACR Breast Density Category b: There are scattered areas of
fibroglandular density.
FINDINGS: Mammographically, there are no suspicious masses or areas of
architectural distortion in the left breast. The previously seen
asymmetry in the inferior left breast, posterior depth, corresponds
to a skin partial, which has ruptured since patient's screening
mammogram, per patient's report. This was documented by placing a BB
marker on the area and taking and MLO image.

Mammographic images were processed with CAD.

On physical exam, raised reddish papule is seen in the left breast
slightly lower inner quadrant, far posterior depth.

Targeted ultrasound is performed, showing left breast 9 o'clock 15
cm from the nipple intradermal hypoechoic circumscribed phlegmon
collection measuring 1.1 x 0.8 x 0.3 cm. This finding likely
represents benign epidermal inclusion cyst and corresponds to the
mammographically seen abnormality.
IMPRESSION: No mammographic or sonographic evidence of malignancy in the left
breast.

Left breast 9 o'clock benign-appearing epidermal inclusion cyst, for
which dermatology consultation may be considered.

RECOMMENDATION:
Screening mammogram in one year.(Code:KK-H-C95)

I have discussed the findings and recommendations with the patient.
Results were also provided in writing at the conclusion of the
visit. If applicable, a reminder letter will be sent to the patient
regarding the next appointment.

BI-RADS CATEGORY  2: Benign.

## 2019-04-04 DIAGNOSIS — M1811 Unilateral primary osteoarthritis of first carpometacarpal joint, right hand: Secondary | ICD-10-CM | POA: Diagnosis not present

## 2019-04-04 DIAGNOSIS — M7541 Impingement syndrome of right shoulder: Secondary | ICD-10-CM | POA: Diagnosis not present

## 2019-04-04 DIAGNOSIS — M189 Osteoarthritis of first carpometacarpal joint, unspecified: Secondary | ICD-10-CM | POA: Diagnosis not present

## 2019-04-04 DIAGNOSIS — M1711 Unilateral primary osteoarthritis, right knee: Secondary | ICD-10-CM | POA: Diagnosis not present

## 2019-04-06 DIAGNOSIS — Z1211 Encounter for screening for malignant neoplasm of colon: Secondary | ICD-10-CM | POA: Diagnosis not present

## 2019-04-06 DIAGNOSIS — G35 Multiple sclerosis: Secondary | ICD-10-CM | POA: Diagnosis not present

## 2019-04-06 DIAGNOSIS — Z79899 Other long term (current) drug therapy: Secondary | ICD-10-CM | POA: Diagnosis not present

## 2019-04-06 DIAGNOSIS — R1310 Dysphagia, unspecified: Secondary | ICD-10-CM | POA: Diagnosis not present

## 2019-04-06 DIAGNOSIS — Z794 Long term (current) use of insulin: Secondary | ICD-10-CM | POA: Diagnosis not present

## 2019-04-06 DIAGNOSIS — I10 Essential (primary) hypertension: Secondary | ICD-10-CM | POA: Diagnosis not present

## 2019-04-06 DIAGNOSIS — J439 Emphysema, unspecified: Secondary | ICD-10-CM | POA: Diagnosis not present

## 2019-04-06 DIAGNOSIS — E039 Hypothyroidism, unspecified: Secondary | ICD-10-CM | POA: Diagnosis not present

## 2019-04-06 DIAGNOSIS — E119 Type 2 diabetes mellitus without complications: Secondary | ICD-10-CM | POA: Diagnosis not present

## 2019-04-06 DIAGNOSIS — Z0000 Encounter for general adult medical examination without abnormal findings: Secondary | ICD-10-CM | POA: Diagnosis not present

## 2019-04-06 DIAGNOSIS — E7800 Pure hypercholesterolemia, unspecified: Secondary | ICD-10-CM | POA: Diagnosis not present

## 2019-04-13 DIAGNOSIS — G35 Multiple sclerosis: Secondary | ICD-10-CM | POA: Diagnosis not present

## 2019-04-13 DIAGNOSIS — F3341 Major depressive disorder, recurrent, in partial remission: Secondary | ICD-10-CM | POA: Diagnosis not present

## 2019-04-29 DIAGNOSIS — Z794 Long term (current) use of insulin: Secondary | ICD-10-CM | POA: Diagnosis not present

## 2019-04-29 DIAGNOSIS — Z79899 Other long term (current) drug therapy: Secondary | ICD-10-CM | POA: Diagnosis not present

## 2019-04-29 DIAGNOSIS — E119 Type 2 diabetes mellitus without complications: Secondary | ICD-10-CM | POA: Diagnosis not present

## 2019-04-29 DIAGNOSIS — E039 Hypothyroidism, unspecified: Secondary | ICD-10-CM | POA: Diagnosis not present

## 2019-04-29 DIAGNOSIS — I10 Essential (primary) hypertension: Secondary | ICD-10-CM | POA: Diagnosis not present

## 2019-04-29 DIAGNOSIS — E7800 Pure hypercholesterolemia, unspecified: Secondary | ICD-10-CM | POA: Diagnosis not present

## 2019-05-24 DIAGNOSIS — M1711 Unilateral primary osteoarthritis, right knee: Secondary | ICD-10-CM | POA: Diagnosis not present

## 2019-05-24 DIAGNOSIS — M533 Sacrococcygeal disorders, not elsewhere classified: Secondary | ICD-10-CM | POA: Diagnosis not present

## 2019-05-25 ENCOUNTER — Other Ambulatory Visit: Payer: Self-pay | Admitting: Orthopedic Surgery

## 2019-05-29 DIAGNOSIS — Z7982 Long term (current) use of aspirin: Secondary | ICD-10-CM | POA: Diagnosis not present

## 2019-05-29 DIAGNOSIS — Z87891 Personal history of nicotine dependence: Secondary | ICD-10-CM | POA: Diagnosis not present

## 2019-05-29 DIAGNOSIS — E119 Type 2 diabetes mellitus without complications: Secondary | ICD-10-CM | POA: Diagnosis not present

## 2019-05-29 DIAGNOSIS — F419 Anxiety disorder, unspecified: Secondary | ICD-10-CM | POA: Diagnosis not present

## 2019-05-29 DIAGNOSIS — L409 Psoriasis, unspecified: Secondary | ICD-10-CM | POA: Diagnosis not present

## 2019-05-29 DIAGNOSIS — G35 Multiple sclerosis: Secondary | ICD-10-CM | POA: Diagnosis not present

## 2019-05-29 DIAGNOSIS — M810 Age-related osteoporosis without current pathological fracture: Secondary | ICD-10-CM | POA: Diagnosis not present

## 2019-05-29 DIAGNOSIS — F329 Major depressive disorder, single episode, unspecified: Secondary | ICD-10-CM | POA: Diagnosis not present

## 2019-05-29 DIAGNOSIS — Z7952 Long term (current) use of systemic steroids: Secondary | ICD-10-CM | POA: Diagnosis not present

## 2019-05-29 DIAGNOSIS — Z9181 History of falling: Secondary | ICD-10-CM | POA: Diagnosis not present

## 2019-05-29 DIAGNOSIS — Z7984 Long term (current) use of oral hypoglycemic drugs: Secondary | ICD-10-CM | POA: Diagnosis not present

## 2019-05-29 DIAGNOSIS — Z96652 Presence of left artificial knee joint: Secondary | ICD-10-CM | POA: Diagnosis not present

## 2019-05-29 DIAGNOSIS — G4730 Sleep apnea, unspecified: Secondary | ICD-10-CM | POA: Diagnosis not present

## 2019-05-29 DIAGNOSIS — M533 Sacrococcygeal disorders, not elsewhere classified: Secondary | ICD-10-CM | POA: Diagnosis not present

## 2019-05-29 DIAGNOSIS — M19041 Primary osteoarthritis, right hand: Secondary | ICD-10-CM | POA: Diagnosis not present

## 2019-05-29 DIAGNOSIS — M1711 Unilateral primary osteoarthritis, right knee: Secondary | ICD-10-CM | POA: Diagnosis not present

## 2019-05-29 DIAGNOSIS — E7800 Pure hypercholesterolemia, unspecified: Secondary | ICD-10-CM | POA: Diagnosis not present

## 2019-05-29 DIAGNOSIS — I10 Essential (primary) hypertension: Secondary | ICD-10-CM | POA: Diagnosis not present

## 2019-06-02 DIAGNOSIS — K219 Gastro-esophageal reflux disease without esophagitis: Secondary | ICD-10-CM | POA: Diagnosis not present

## 2019-06-28 ENCOUNTER — Inpatient Hospital Stay: Admit: 2019-06-28 | Payer: PPO | Admitting: Orthopedic Surgery

## 2019-06-28 SURGERY — ARTHROPLASTY, KNEE, TOTAL
Anesthesia: Choice | Site: Knee | Laterality: Right

## 2019-07-08 DIAGNOSIS — E119 Type 2 diabetes mellitus without complications: Secondary | ICD-10-CM | POA: Diagnosis not present

## 2019-07-08 DIAGNOSIS — E039 Hypothyroidism, unspecified: Secondary | ICD-10-CM | POA: Diagnosis not present

## 2019-07-08 DIAGNOSIS — Z794 Long term (current) use of insulin: Secondary | ICD-10-CM | POA: Diagnosis not present

## 2019-07-08 DIAGNOSIS — Z79899 Other long term (current) drug therapy: Secondary | ICD-10-CM | POA: Diagnosis not present

## 2019-07-08 DIAGNOSIS — I10 Essential (primary) hypertension: Secondary | ICD-10-CM | POA: Diagnosis not present

## 2019-07-08 DIAGNOSIS — G35 Multiple sclerosis: Secondary | ICD-10-CM | POA: Diagnosis not present

## 2019-07-08 DIAGNOSIS — E7800 Pure hypercholesterolemia, unspecified: Secondary | ICD-10-CM | POA: Diagnosis not present

## 2019-08-22 DIAGNOSIS — E559 Vitamin D deficiency, unspecified: Secondary | ICD-10-CM | POA: Diagnosis not present

## 2019-08-22 DIAGNOSIS — G35 Multiple sclerosis: Secondary | ICD-10-CM | POA: Diagnosis not present

## 2019-08-22 DIAGNOSIS — E538 Deficiency of other specified B group vitamins: Secondary | ICD-10-CM | POA: Diagnosis not present

## 2019-08-23 DIAGNOSIS — E559 Vitamin D deficiency, unspecified: Secondary | ICD-10-CM | POA: Diagnosis not present

## 2019-08-23 DIAGNOSIS — E538 Deficiency of other specified B group vitamins: Secondary | ICD-10-CM | POA: Diagnosis not present

## 2019-09-05 DIAGNOSIS — Z135 Encounter for screening for eye and ear disorders: Secondary | ICD-10-CM | POA: Diagnosis not present

## 2019-09-05 DIAGNOSIS — Z794 Long term (current) use of insulin: Secondary | ICD-10-CM | POA: Diagnosis not present

## 2019-09-05 DIAGNOSIS — G35 Multiple sclerosis: Secondary | ICD-10-CM | POA: Diagnosis not present

## 2019-09-05 DIAGNOSIS — E7800 Pure hypercholesterolemia, unspecified: Secondary | ICD-10-CM | POA: Diagnosis not present

## 2019-09-05 DIAGNOSIS — Z23 Encounter for immunization: Secondary | ICD-10-CM | POA: Diagnosis not present

## 2019-09-05 DIAGNOSIS — I10 Essential (primary) hypertension: Secondary | ICD-10-CM | POA: Diagnosis not present

## 2019-09-05 DIAGNOSIS — E039 Hypothyroidism, unspecified: Secondary | ICD-10-CM | POA: Diagnosis not present

## 2019-09-05 DIAGNOSIS — Z79899 Other long term (current) drug therapy: Secondary | ICD-10-CM | POA: Diagnosis not present

## 2019-09-05 DIAGNOSIS — E119 Type 2 diabetes mellitus without complications: Secondary | ICD-10-CM | POA: Diagnosis not present

## 2019-09-09 DIAGNOSIS — M1611 Unilateral primary osteoarthritis, right hip: Secondary | ICD-10-CM | POA: Diagnosis not present

## 2019-09-09 DIAGNOSIS — M1711 Unilateral primary osteoarthritis, right knee: Secondary | ICD-10-CM | POA: Diagnosis not present

## 2019-10-06 DIAGNOSIS — J069 Acute upper respiratory infection, unspecified: Secondary | ICD-10-CM | POA: Diagnosis not present

## 2019-10-06 DIAGNOSIS — G35 Multiple sclerosis: Secondary | ICD-10-CM | POA: Diagnosis not present

## 2019-10-14 ENCOUNTER — Other Ambulatory Visit: Payer: Self-pay | Admitting: Internal Medicine

## 2019-10-14 DIAGNOSIS — Z1231 Encounter for screening mammogram for malignant neoplasm of breast: Secondary | ICD-10-CM

## 2019-10-24 DIAGNOSIS — Z79899 Other long term (current) drug therapy: Secondary | ICD-10-CM | POA: Diagnosis not present

## 2019-10-24 DIAGNOSIS — Z794 Long term (current) use of insulin: Secondary | ICD-10-CM | POA: Diagnosis not present

## 2019-10-24 DIAGNOSIS — E039 Hypothyroidism, unspecified: Secondary | ICD-10-CM | POA: Diagnosis not present

## 2019-10-24 DIAGNOSIS — E119 Type 2 diabetes mellitus without complications: Secondary | ICD-10-CM | POA: Diagnosis not present

## 2019-10-24 DIAGNOSIS — I10 Essential (primary) hypertension: Secondary | ICD-10-CM | POA: Diagnosis not present

## 2019-10-24 DIAGNOSIS — E7800 Pure hypercholesterolemia, unspecified: Secondary | ICD-10-CM | POA: Diagnosis not present

## 2019-10-31 DIAGNOSIS — E039 Hypothyroidism, unspecified: Secondary | ICD-10-CM | POA: Diagnosis not present

## 2019-10-31 DIAGNOSIS — E7800 Pure hypercholesterolemia, unspecified: Secondary | ICD-10-CM | POA: Diagnosis not present

## 2019-10-31 DIAGNOSIS — Z794 Long term (current) use of insulin: Secondary | ICD-10-CM | POA: Diagnosis not present

## 2019-10-31 DIAGNOSIS — R0602 Shortness of breath: Secondary | ICD-10-CM | POA: Diagnosis not present

## 2019-10-31 DIAGNOSIS — I10 Essential (primary) hypertension: Secondary | ICD-10-CM | POA: Diagnosis not present

## 2019-10-31 DIAGNOSIS — E119 Type 2 diabetes mellitus without complications: Secondary | ICD-10-CM | POA: Diagnosis not present

## 2019-10-31 DIAGNOSIS — G35 Multiple sclerosis: Secondary | ICD-10-CM | POA: Diagnosis not present

## 2019-11-15 ENCOUNTER — Ambulatory Visit
Admission: RE | Admit: 2019-11-15 | Discharge: 2019-11-15 | Disposition: A | Discharge status: Home / Self Care | Payer: PPO | Source: Ambulatory Visit | Attending: Internal Medicine | Admitting: Internal Medicine

## 2019-11-15 DIAGNOSIS — Z1231 Encounter for screening mammogram for malignant neoplasm of breast: Secondary | ICD-10-CM | POA: Diagnosis not present

## 2019-11-24 DIAGNOSIS — R0602 Shortness of breath: Secondary | ICD-10-CM | POA: Diagnosis not present

## 2019-11-24 DIAGNOSIS — I10 Essential (primary) hypertension: Secondary | ICD-10-CM | POA: Diagnosis not present

## 2020-01-03 ENCOUNTER — Other Ambulatory Visit: Payer: Self-pay | Admitting: Neurology

## 2020-01-03 DIAGNOSIS — G35 Multiple sclerosis: Secondary | ICD-10-CM

## 2020-01-06 ENCOUNTER — Other Ambulatory Visit: Payer: Self-pay

## 2020-01-06 ENCOUNTER — Ambulatory Visit: Payer: PPO

## 2020-01-06 ENCOUNTER — Ambulatory Visit: Payer: Medicare HMO | Attending: Internal Medicine

## 2020-01-06 DIAGNOSIS — Z23 Encounter for immunization: Secondary | ICD-10-CM | POA: Insufficient documentation

## 2020-01-06 NOTE — Progress Notes (Signed)
Covid-19 Vaccination Clinic  Name:  Ashlee Fields    MRN: [id number] DOB: 07-22-1956  01/06/2020  [name] was observed post Covid-19 immunization for 15 minutes without incidence. She was provided with Vaccine Information Sheet and instruction to access the V-Safe system.   [name] was instructed to call 911 with any severe reactions post vaccine: Marland Kitchen Difficulty breathing  . Swelling of your face and throat  . A fast heartbeat  . A bad rash all over your body  . Dizziness and weakness    Immunizations Administered    Name Date Dose VIS Date Route   Pfizer COVID-19 Vaccine 01/06/2020 10:26 AM 0.3 mL 11/04/2019 Intramuscular   Manufacturer: [place]   Lot: [id number]   [place]: [id number]

## 2020-01-24 ENCOUNTER — Other Ambulatory Visit: Payer: Self-pay | Admitting: Orthopedic Surgery

## 2020-01-31 ENCOUNTER — Ambulatory Visit: Payer: Medicare HMO | Attending: Internal Medicine

## 2020-01-31 DIAGNOSIS — Z23 Encounter for immunization: Secondary | ICD-10-CM | POA: Insufficient documentation

## 2020-01-31 NOTE — Progress Notes (Signed)
Covid-19 Vaccination Clinic  Name:  SIRIYAH AMBROSIUS    MRN: [id number] DOB: [birth year]  01/31/2020  Ms. Hauth was observed post Covid-19 immunization for 15 minutes without incident. She was provided with Vaccine Information Sheet and instruction to access the V-Safe system.   Ms. Blahnik was instructed to call 911 with any severe reactions post vaccine: Marland Kitchen Difficulty breathing  . Swelling of face and throat  . A fast heartbeat  . A bad rash all over body  . Dizziness and weakness   Immunizations Administered    Name Date Dose VIS Date Route   Pfizer COVID-19 Vaccine 01/31/2020 11:29 AM 0.3 mL 11/04/2019 Intramuscular   Manufacturer: [place]   Lot: [id number]   [place]: [id number]

## 2020-02-21 ENCOUNTER — Other Ambulatory Visit: Payer: Self-pay | Admitting: Physician Assistant

## 2020-02-21 DIAGNOSIS — N179 Acute kidney failure, unspecified: Secondary | ICD-10-CM

## 2020-02-22 ENCOUNTER — Other Ambulatory Visit: Payer: Self-pay

## 2020-02-22 ENCOUNTER — Encounter
Admission: RE | Admit: 2020-02-22 | Discharge: 2020-02-22 | Disposition: A | Discharge status: Home / Self Care | Payer: Medicare HMO | Source: Ambulatory Visit | Attending: Orthopedic Surgery | Admitting: Orthopedic Surgery

## 2020-02-22 HISTORY — DX: Gastro-esophageal reflux disease without esophagitis: K21.9

## 2020-02-22 HISTORY — DX: Sleep apnea, unspecified: G47.30

## 2020-02-22 HISTORY — DX: Anxiety disorder, unspecified: F41.9

## 2020-02-22 HISTORY — DX: Depression, unspecified: F32.A

## 2020-02-22 HISTORY — DX: Type 2 diabetes mellitus without complications: E11.9

## 2020-02-22 HISTORY — DX: Essential (primary) hypertension: I10

## 2020-02-22 HISTORY — DX: Hypothyroidism, unspecified: E03.9

## 2020-02-22 NOTE — Pre-Procedure Instructions (Signed)
Dr. Mack Guise made aware of allergy to PCN and wishes to proceed with the use of Ancef preoperatively along with the Clindamycin.

## 2020-02-22 NOTE — Patient Instructions (Addendum)
Your procedure is scheduled on:  Tuesday, April 6 Report to Day Surgery on the 2nd floor of the [facility]. To find out your arrival time, please call [phone] between 1PM - 3PM on: Monday, April 5  REMEMBER: Instructions that are not followed completely may result in serious medical risk, up to and including death; or upon the discretion of your surgeon and anesthesiologist your surgery may need to be rescheduled.  Do not eat food after midnight the night before surgery.  No gum chewing, lozengers or hard candies.  You may however, drink water up to 2 hours before you are scheduled to arrive for your surgery. Do not drink anything within 2 hours of the start of your surgery.  TAKE THESE MEDICATIONS THE MORNING OF SURGERY WITH A SIP OF WATER:  1.  Fluoxetine (Prozac) 2.  Levothyroxine (Synthroid) 3.  Lorazepam (Ativan) 4.  Mirabegron (Myrbetriq) 5.  Pantoprazole (Protonix) 6.  Prednisone 7.  Tecfidera 8.  Tramadol or Tylenol (if needed for pain)  Stop Metformin 2 days prior to surgery. Last day to take is Saturday, April 3; resume after surgery.  Stop Anti-inflammatories (NSAIDS) such as Advil, Aleve, Ibuprofen, Motrin, Naproxen, Naprosyn and Aspirin based products such as Excedrin, Goodys Powder, BC Powder. (May take Tylenol or Acetaminophen if needed.)  Stop ANY OVER THE COUNTER supplements until after surgery. (May continue Vitamin B, and multivitamin.)  No Alcohol for 24 hours before or after surgery.  No Smoking including e-cigarettes for 24 hours prior to surgery.   On the morning of surgery brush your teeth with toothpaste and water, you may rinse your mouth with mouthwash if you wish. Do not swallow any toothpaste or mouthwash.  Do not wear jewelry, make-up, hairpins, clips or nail polish.  Do not wear lotions, powders, or perfumes.   Do not shave 48 hours prior to surgery.   Contact lenses, hearing aids and dentures may not be worn into surgery.  Do not  bring valuables to the hospital, including drivers license, insurance or credit cards.  [facility] is not responsible for any belongings or valuables.   Use CHG Soap or wipes as directed on instruction sheet.  Bring your C-PAP to the hospital with you in case you may have to spend the night.   Notify your doctor if there is any change in your medical condition (cold, fever, infection).  Wear comfortable clothing (specific to your surgery type) to the hospital.  If you are being admitted to the hospital overnight, leave your suitcase in the car. After surgery it may be brought to your room.  Please call [phone] if you have any questions about these instructions.  Visitation Policy:  Patients undergoing a surgery or procedure in a hospital may have one family member or support person with them as long as that person is not COVID-19 positive or experiencing its symptoms. That person may remain in the waiting area during the procedure. Should the patient need to stay at the hospital during part of their recovery, the support person may visit during visiting hours; 7 am to 8 pm.  Inpatient Visitation Update:  Two designated support people may visit a patient during visiting hours 7 am to 8 pm. It must be the same two designated people for the duration of the patient stay. The visitors may come and go during the day, and there is no switching out to have different visitors. A mask must be worn at all times, including in the patient room.

## 2020-02-23 ENCOUNTER — Ambulatory Visit: Payer: Medicare HMO

## 2020-02-24 ENCOUNTER — Other Ambulatory Visit: Payer: Medicare HMO

## 2020-02-24 ENCOUNTER — Other Ambulatory Visit: Payer: Self-pay

## 2020-02-24 ENCOUNTER — Encounter
Admission: RE | Admit: 2020-02-24 | Discharge: 2020-02-24 | Disposition: A | Discharge status: Home / Self Care | Payer: Medicare HMO | Source: Ambulatory Visit | Attending: Orthopedic Surgery | Admitting: Orthopedic Surgery

## 2020-02-24 ENCOUNTER — Ambulatory Visit
Admission: RE | Admit: 2020-02-24 | Discharge: 2020-02-24 | Disposition: A | Discharge status: Home / Self Care | Payer: Medicare HMO | Source: Ambulatory Visit | Attending: Physician Assistant | Admitting: Physician Assistant

## 2020-02-24 DIAGNOSIS — N179 Acute kidney failure, unspecified: Secondary | ICD-10-CM | POA: Diagnosis present

## 2020-02-24 LAB — SURGICAL PCR SCREEN
MRSA, PCR: NEGATIVE
Staphylococcus aureus: NEGATIVE

## 2020-02-24 LAB — APTT

## 2020-02-24 LAB — PROTIME-INR
INR: 0.9 (ref 0.8–1.2)
Prothrombin Time: 12.3 seconds (ref 11.4–15.2)

## 2020-02-25 LAB — TYPE AND SCREEN
ABO/RH(D): O POS
Antibody Screen: NEGATIVE

## 2020-02-27 ENCOUNTER — Ambulatory Visit: Payer: Medicare HMO

## 2020-02-27 ENCOUNTER — Other Ambulatory Visit: Payer: Self-pay

## 2020-02-27 ENCOUNTER — Other Ambulatory Visit
Admission: RE | Admit: 2020-02-27 | Discharge: 2020-02-27 | Disposition: A | Discharge status: Home / Self Care | Payer: Medicare HMO | Source: Ambulatory Visit | Attending: Orthopedic Surgery | Admitting: Orthopedic Surgery

## 2020-02-27 ENCOUNTER — Other Ambulatory Visit: Payer: PPO

## 2020-02-27 DIAGNOSIS — Z01812 Encounter for preprocedural laboratory examination: Secondary | ICD-10-CM | POA: Insufficient documentation

## 2020-02-27 DIAGNOSIS — Z20822 Contact with and (suspected) exposure to covid-19: Secondary | ICD-10-CM | POA: Insufficient documentation

## 2020-02-27 LAB — SARS CORONAVIRUS 2 (TAT 6-24 HRS): SARS Coronavirus 2: NEGATIVE

## 2020-02-28 ENCOUNTER — Encounter: Payer: Self-pay | Admitting: Orthopedic Surgery

## 2020-02-28 ENCOUNTER — Ambulatory Visit: Payer: Medicare HMO

## 2020-02-28 ENCOUNTER — Inpatient Hospital Stay: Payer: Medicare HMO | Admitting: Anesthesiology

## 2020-02-28 ENCOUNTER — Observation Stay: Payer: Medicare HMO

## 2020-02-28 ENCOUNTER — Inpatient Hospital Stay
Admission: RE | Admit: 2020-02-28 | Discharge: 2020-03-12 | DRG: 469 | Disposition: A | Discharge status: Skilled Nursing Facility | Payer: Medicare HMO | Attending: Orthopedic Surgery | Admitting: Orthopedic Surgery

## 2020-02-28 ENCOUNTER — Encounter
Admission: RE | Disposition: A | Discharge status: Skilled Nursing Facility | Payer: Self-pay | Source: Home / Self Care | Attending: Orthopedic Surgery

## 2020-02-28 DIAGNOSIS — E785 Hyperlipidemia, unspecified: Secondary | ICD-10-CM | POA: Diagnosis present

## 2020-02-28 DIAGNOSIS — Z96651 Presence of right artificial knee joint: Secondary | ICD-10-CM

## 2020-02-28 DIAGNOSIS — M1711 Unilateral primary osteoarthritis, right knee: Secondary | ICD-10-CM | POA: Diagnosis present

## 2020-02-28 DIAGNOSIS — R059 Cough, unspecified: Secondary | ICD-10-CM

## 2020-02-28 DIAGNOSIS — F418 Other specified anxiety disorders: Secondary | ICD-10-CM | POA: Diagnosis present

## 2020-02-28 DIAGNOSIS — Z882 Allergy status to sulfonamides status: Secondary | ICD-10-CM | POA: Diagnosis not present

## 2020-02-28 DIAGNOSIS — Z880 Allergy status to penicillin: Secondary | ICD-10-CM | POA: Diagnosis not present

## 2020-02-28 DIAGNOSIS — G4733 Obstructive sleep apnea (adult) (pediatric): Secondary | ICD-10-CM | POA: Diagnosis present

## 2020-02-28 DIAGNOSIS — E119 Type 2 diabetes mellitus without complications: Secondary | ICD-10-CM | POA: Diagnosis present

## 2020-02-28 DIAGNOSIS — Z87891 Personal history of nicotine dependence: Secondary | ICD-10-CM

## 2020-02-28 DIAGNOSIS — I272 Pulmonary hypertension, unspecified: Secondary | ICD-10-CM | POA: Diagnosis present

## 2020-02-28 DIAGNOSIS — J9621 Acute and chronic respiratory failure with hypoxia: Secondary | ICD-10-CM | POA: Diagnosis not present

## 2020-02-28 DIAGNOSIS — Z7952 Long term (current) use of systemic steroids: Secondary | ICD-10-CM

## 2020-02-28 DIAGNOSIS — R319 Hematuria, unspecified: Secondary | ICD-10-CM | POA: Diagnosis not present

## 2020-02-28 DIAGNOSIS — J441 Chronic obstructive pulmonary disease with (acute) exacerbation: Secondary | ICD-10-CM | POA: Diagnosis present

## 2020-02-28 DIAGNOSIS — N200 Calculus of kidney: Secondary | ICD-10-CM | POA: Diagnosis present

## 2020-02-28 DIAGNOSIS — E039 Hypothyroidism, unspecified: Secondary | ICD-10-CM | POA: Diagnosis present

## 2020-02-28 DIAGNOSIS — I10 Essential (primary) hypertension: Secondary | ICD-10-CM | POA: Diagnosis present

## 2020-02-28 DIAGNOSIS — G35 Multiple sclerosis: Secondary | ICD-10-CM | POA: Diagnosis present

## 2020-02-28 DIAGNOSIS — Z20822 Contact with and (suspected) exposure to covid-19: Secondary | ICD-10-CM | POA: Diagnosis present

## 2020-02-28 DIAGNOSIS — R32 Unspecified urinary incontinence: Secondary | ICD-10-CM | POA: Diagnosis present

## 2020-02-28 DIAGNOSIS — R310 Gross hematuria: Secondary | ICD-10-CM | POA: Diagnosis not present

## 2020-02-28 DIAGNOSIS — K5900 Constipation, unspecified: Secondary | ICD-10-CM | POA: Diagnosis present

## 2020-02-28 DIAGNOSIS — Z7983 Long term (current) use of bisphosphonates: Secondary | ICD-10-CM | POA: Diagnosis not present

## 2020-02-28 DIAGNOSIS — Z96652 Presence of left artificial knee joint: Secondary | ICD-10-CM | POA: Diagnosis present

## 2020-02-28 DIAGNOSIS — J449 Chronic obstructive pulmonary disease, unspecified: Secondary | ICD-10-CM | POA: Diagnosis not present

## 2020-02-28 DIAGNOSIS — M810 Age-related osteoporosis without current pathological fracture: Secondary | ICD-10-CM | POA: Diagnosis present

## 2020-02-28 DIAGNOSIS — Z87440 Personal history of urinary (tract) infections: Secondary | ICD-10-CM

## 2020-02-28 DIAGNOSIS — K219 Gastro-esophageal reflux disease without esophagitis: Secondary | ICD-10-CM | POA: Diagnosis present

## 2020-02-28 DIAGNOSIS — Z886 Allergy status to analgesic agent status: Secondary | ICD-10-CM

## 2020-02-28 DIAGNOSIS — Z79899 Other long term (current) drug therapy: Secondary | ICD-10-CM

## 2020-02-28 DIAGNOSIS — R05 Cough: Secondary | ICD-10-CM

## 2020-02-28 HISTORY — PX: TOTAL KNEE ARTHROPLASTY: SHX125

## 2020-02-28 LAB — ABO/RH: ABO/RH(D): O POS

## 2020-02-28 LAB — PROTIME-INR
INR: 0.9 (ref 0.8–1.2)
Prothrombin Time: 11.9 s (ref 11.4–15.2)

## 2020-02-28 LAB — APTT: aPTT: 27 seconds (ref 24–36)

## 2020-02-28 LAB — GLUCOSE, CAPILLARY
Glucose-Capillary: 107 mg/dL — ABNORMAL HIGH (ref 70–99)
Glucose-Capillary: 192 mg/dL — ABNORMAL HIGH (ref 70–99)
Glucose-Capillary: 190 mg/dL — ABNORMAL HIGH (ref 70–99)

## 2020-02-28 SURGERY — ARTHROPLASTY, KNEE, TOTAL
Anesthesia: General | Site: Knee | Laterality: Right

## 2020-02-28 MED ORDER — BUPIVACAINE-EPINEPHRINE 0.25% -1:200000 IJ SOLN
INTRAMUSCULAR | Status: DC | PRN
  Administered 2020-02-28: 60 mL

## 2020-02-28 MED ORDER — PHENDIMETRAZINE TARTRATE 35 MG PO TABS: 105.0000 mg | ORAL_TABLET | Freq: Every day | ORAL | Status: DC

## 2020-02-28 MED ORDER — BUPIVACAINE LIPOSOME 1.3 % IJ SUSP
INTRAMUSCULAR | Status: AC
  Filled 2020-02-28: qty 20

## 2020-02-28 MED ORDER — SUGAMMADEX SODIUM 500 MG/5ML IV SOLN
INTRAVENOUS | Status: DC | PRN
  Administered 2020-02-28: 200 mg via INTRAVENOUS

## 2020-02-28 MED ORDER — IRBESARTAN 150 MG PO TABS
300.0000 mg | ORAL_TABLET | Freq: Every day | ORAL | Status: DC
  Administered 2020-02-28 – 2020-03-12 (×12): 300 mg via ORAL
  Filled 2020-02-28 (×14): qty 2

## 2020-02-28 MED ORDER — FENTANYL CITRATE (PF) 100 MCG/2ML IJ SOLN
INTRAMUSCULAR | Status: AC
  Filled 2020-02-28: qty 2

## 2020-02-28 MED ORDER — PREDNISONE 10 MG PO TABS
10.0000 mg | ORAL_TABLET | Freq: Every day | ORAL | Status: DC
  Administered 2020-02-29 – 2020-03-12 (×13): 10 mg via ORAL
  Filled 2020-02-28 (×13): qty 1

## 2020-02-28 MED ORDER — TRANEXAMIC ACID-NACL 1000-0.7 MG/100ML-% IV SOLN
INTRAVENOUS | Status: AC
  Filled 2020-02-28: qty 100

## 2020-02-28 MED ORDER — ALENDRONATE SODIUM 70 MG PO TABS: 70.0000 mg | ORAL_TABLET | ORAL | Status: DC

## 2020-02-28 MED ORDER — TRAMADOL HCL 50 MG PO TABS
50.0000 mg | ORAL_TABLET | Freq: Four times a day (QID) | ORAL | Status: DC
  Administered 2020-02-28 – 2020-03-11 (×43): 50 mg via ORAL
  Filled 2020-02-28 (×45): qty 1

## 2020-02-28 MED ORDER — CHLORHEXIDINE GLUCONATE CLOTH 2 % EX PADS: 6.0000 | MEDICATED_PAD | Freq: Once | CUTANEOUS | Status: DC

## 2020-02-28 MED ORDER — NEOMYCIN-POLYMYXIN B GU 40-200000 IR SOLN
Status: DC | PRN
  Administered 2020-02-28: 16 mL

## 2020-02-28 MED ORDER — FENTANYL CITRATE (PF) 100 MCG/2ML IJ SOLN
INTRAMUSCULAR | Status: DC | PRN
  Administered 2020-02-28: 50 ug via INTRAVENOUS
  Administered 2020-02-28: 25 ug via INTRAVENOUS

## 2020-02-28 MED ORDER — BUPIVACAINE HCL (PF) 0.25 % IJ SOLN
INTRAMUSCULAR | Status: AC
  Filled 2020-02-28: qty 60

## 2020-02-28 MED ORDER — HYDROMORPHONE HCL 1 MG/ML IJ SOLN
INTRAMUSCULAR | Status: AC
  Filled 2020-02-28: qty 1

## 2020-02-28 MED ORDER — PROPOFOL 500 MG/50ML IV EMUL
INTRAVENOUS | Status: AC
  Filled 2020-02-28: qty 100

## 2020-02-28 MED ORDER — ROCURONIUM BROMIDE 10 MG/ML (PF) SYRINGE
PREFILLED_SYRINGE | INTRAVENOUS | Status: AC
  Filled 2020-02-28: qty 10

## 2020-02-28 MED ORDER — CEFAZOLIN SODIUM-DEXTROSE 2-4 GM/100ML-% IV SOLN
2.0000 g | INTRAVENOUS | Status: AC
  Administered 2020-02-28: 08:00:00 2 g via INTRAVENOUS

## 2020-02-28 MED ORDER — SUCCINYLCHOLINE CHLORIDE 200 MG/10ML IV SOSY
PREFILLED_SYRINGE | INTRAVENOUS | Status: AC
  Filled 2020-02-28: qty 10

## 2020-02-28 MED ORDER — PROPOFOL 10 MG/ML IV BOLUS
INTRAVENOUS | Status: DC | PRN
  Administered 2020-02-28: 150 mg via INTRAVENOUS

## 2020-02-28 MED ORDER — MIDAZOLAM HCL 2 MG/2ML IJ SOLN
INTRAMUSCULAR | Status: AC
  Filled 2020-02-28: qty 2

## 2020-02-28 MED ORDER — CLINDAMYCIN PHOSPHATE 600 MG/50ML IV SOLN
INTRAVENOUS | Status: AC
  Filled 2020-02-28: qty 50

## 2020-02-28 MED ORDER — ACETAMINOPHEN 10 MG/ML IV SOLN
INTRAVENOUS | Status: AC
  Filled 2020-02-28: qty 100

## 2020-02-28 MED ORDER — ONDANSETRON HCL 4 MG/2ML IJ SOLN
INTRAMUSCULAR | Status: DC | PRN
  Administered 2020-02-28: 4 mg via INTRAVENOUS

## 2020-02-28 MED ORDER — DOCUSATE SODIUM 100 MG PO CAPS
100.0000 mg | ORAL_CAPSULE | Freq: Two times a day (BID) | ORAL | Status: DC
  Administered 2020-02-28 – 2020-03-12 (×26): 100 mg via ORAL
  Filled 2020-02-28 (×27): qty 1

## 2020-02-28 MED ORDER — MIDAZOLAM HCL 2 MG/2ML IJ SOLN
INTRAMUSCULAR | Status: DC | PRN
  Administered 2020-02-28: 1 mg via INTRAVENOUS

## 2020-02-28 MED ORDER — METFORMIN HCL ER 750 MG PO TB24
1500.0000 mg | ORAL_TABLET | Freq: Every day | ORAL | Status: DC
  Administered 2020-02-28: 22:00:00 1500 mg via ORAL
  Filled 2020-02-28 (×3): qty 2

## 2020-02-28 MED ORDER — OXYCODONE HCL 5 MG PO TABS
5.0000 mg | ORAL_TABLET | ORAL | Status: DC | PRN
  Administered 2020-02-28: 21:00:00 10 mg via ORAL
  Administered 2020-02-29: 5 mg via ORAL
  Administered 2020-02-29 (×2): 10 mg via ORAL
  Administered 2020-03-01 – 2020-03-06 (×8): 5 mg via ORAL
  Administered 2020-03-06: 10 mg via ORAL
  Administered 2020-03-06 – 2020-03-10 (×8): 5 mg via ORAL
  Administered 2020-03-10: 10 mg via ORAL
  Administered 2020-03-11 – 2020-03-12 (×7): 5 mg via ORAL
  Filled 2020-02-28: qty 2
  Filled 2020-02-28: qty 1
  Filled 2020-02-28 (×2): qty 2
  Filled 2020-02-28 (×5): qty 1
  Filled 2020-02-28: qty 2
  Filled 2020-02-28 (×2): qty 1
  Filled 2020-02-28: qty 2
  Filled 2020-02-28: qty 1
  Filled 2020-02-28: qty 2
  Filled 2020-02-28 (×9): qty 1
  Filled 2020-02-28: qty 2
  Filled 2020-02-28 (×7): qty 1

## 2020-02-28 MED ORDER — DEXAMETHASONE SODIUM PHOSPHATE 10 MG/ML IJ SOLN
INTRAMUSCULAR | Status: DC | PRN
  Administered 2020-02-28: 5 mg via INTRAVENOUS

## 2020-02-28 MED ORDER — MORPHINE SULFATE 4 MG/ML IJ SOLN
INTRAMUSCULAR | Status: DC | PRN
  Administered 2020-02-28: 4 mg

## 2020-02-28 MED ORDER — SENNOSIDES-DOCUSATE SODIUM 8.6-50 MG PO TABS: 1.0000 | ORAL_TABLET | Freq: Every evening | ORAL | Status: DC | PRN

## 2020-02-28 MED ORDER — SODIUM CHLORIDE 0.9 % IV SOLN: INTRAVENOUS | Status: DC

## 2020-02-28 MED ORDER — ACETAMINOPHEN 325 MG PO TABS
325.0000 mg | ORAL_TABLET | Freq: Four times a day (QID) | ORAL | Status: DC | PRN
  Administered 2020-02-29 – 2020-03-10 (×14): 650 mg via ORAL
  Administered 2020-03-10: 325 mg via ORAL
  Administered 2020-03-11: 650 mg via ORAL
  Filled 2020-02-28 (×11): qty 2
  Filled 2020-02-28: qty 1
  Filled 2020-02-28 (×4): qty 2

## 2020-02-28 MED ORDER — SODIUM CHLORIDE 0.9 % IV SOLN
INTRAVENOUS | Status: DC | PRN
  Administered 2020-02-28: 60 mL

## 2020-02-28 MED ORDER — LORAZEPAM 1 MG PO TABS
1.0000 mg | ORAL_TABLET | Freq: Two times a day (BID) | ORAL | Status: DC
  Administered 2020-02-28 – 2020-03-11 (×16): 1 mg via ORAL
  Filled 2020-02-28 (×22): qty 1

## 2020-02-28 MED ORDER — GABAPENTIN 300 MG PO CAPS
300.0000 mg | ORAL_CAPSULE | Freq: Three times a day (TID) | ORAL | Status: DC
  Administered 2020-02-28 – 2020-03-12 (×40): 300 mg via ORAL
  Filled 2020-02-28 (×33): qty 1
  Filled 2020-02-28: qty 3
  Filled 2020-02-28 (×6): qty 1

## 2020-02-28 MED ORDER — BACLOFEN 10 MG PO TABS
40.0000 mg | ORAL_TABLET | Freq: Every day | ORAL | Status: DC
  Administered 2020-02-28 – 2020-03-11 (×13): 40 mg via ORAL
  Filled 2020-02-28 (×15): qty 4

## 2020-02-28 MED ORDER — CEFAZOLIN SODIUM-DEXTROSE 2-4 GM/100ML-% IV SOLN
INTRAVENOUS | Status: AC
  Filled 2020-02-28: qty 100

## 2020-02-28 MED ORDER — BUPIVACAINE HCL (PF) 0.5 % IJ SOLN
INTRAMUSCULAR | Status: AC
  Filled 2020-02-28: qty 10

## 2020-02-28 MED ORDER — SUCCINYLCHOLINE CHLORIDE 20 MG/ML IJ SOLN
INTRAMUSCULAR | Status: DC | PRN
  Administered 2020-02-28: 120 mg via INTRAVENOUS

## 2020-02-28 MED ORDER — TRANEXAMIC ACID-NACL 1000-0.7 MG/100ML-% IV SOLN
1000.0000 mg | INTRAVENOUS | Status: AC
  Administered 2020-02-28: 1000 mg via INTRAVENOUS

## 2020-02-28 MED ORDER — FLEET ENEMA 7-19 GM/118ML RE ENEM: 1.0000 | ENEMA | Freq: Once | RECTAL | Status: DC | PRN

## 2020-02-28 MED ORDER — CLINDAMYCIN PHOSPHATE 600 MG/50ML IV SOLN
600.0000 mg | Freq: Once | INTRAVENOUS | Status: AC
  Administered 2020-02-28: 600 mg via INTRAVENOUS

## 2020-02-28 MED ORDER — FENTANYL CITRATE (PF) 100 MCG/2ML IJ SOLN
25.0000 ug | INTRAMUSCULAR | Status: DC | PRN
  Administered 2020-02-28 (×2): 25 ug via INTRAVENOUS

## 2020-02-28 MED ORDER — ENOXAPARIN SODIUM 40 MG/0.4ML ~~LOC~~ SOLN: 40.0000 mg | SUBCUTANEOUS | Status: DC

## 2020-02-28 MED ORDER — DEXAMETHASONE SODIUM PHOSPHATE 10 MG/ML IJ SOLN
INTRAMUSCULAR | Status: AC
  Filled 2020-02-28: qty 1

## 2020-02-28 MED ORDER — ATORVASTATIN CALCIUM 20 MG PO TABS
80.0000 mg | ORAL_TABLET | Freq: Every day | ORAL | Status: DC
  Administered 2020-02-28 – 2020-03-11 (×13): 80 mg via ORAL
  Filled 2020-02-28 (×13): qty 4

## 2020-02-28 MED ORDER — CYANOCOBALAMIN 1000 MCG/ML IJ SOLN
1000.0000 ug | INTRAMUSCULAR | Status: DC
  Filled 2020-02-28: qty 1

## 2020-02-28 MED ORDER — SEMAGLUTIDE(0.25 OR 0.5MG/DOS) 2 MG/1.5ML ~~LOC~~ SOPN: 0.2500 mg | PEN_INJECTOR | SUBCUTANEOUS | Status: DC

## 2020-02-28 MED ORDER — FENTANYL CITRATE (PF) 100 MCG/2ML IJ SOLN
INTRAMUSCULAR | Status: AC
  Administered 2020-02-28: 25 ug via INTRAVENOUS
  Filled 2020-02-28: qty 2

## 2020-02-28 MED ORDER — SODIUM CHLORIDE 0.9 % IV SOLN
INTRAVENOUS | Status: DC | PRN
  Administered 2020-02-28: 30 ug/min via INTRAVENOUS

## 2020-02-28 MED ORDER — CEFAZOLIN SODIUM-DEXTROSE 1-4 GM/50ML-% IV SOLN
1.0000 g | Freq: Four times a day (QID) | INTRAVENOUS | Status: AC
  Administered 2020-02-28 (×2): 1 g via INTRAVENOUS
  Filled 2020-02-28 (×2): qty 50

## 2020-02-28 MED ORDER — PHENOL 1.4 % MT LIQD
1.0000 | OROMUCOSAL | Status: DC | PRN
  Filled 2020-02-28: qty 177

## 2020-02-28 MED ORDER — MENTHOL 3 MG MT LOZG
1.0000 | LOZENGE | OROMUCOSAL | Status: DC | PRN
  Filled 2020-02-28: qty 9

## 2020-02-28 MED ORDER — EPINEPHRINE PF 1 MG/ML IJ SOLN
INTRAMUSCULAR | Status: AC
  Filled 2020-02-28: qty 1

## 2020-02-28 MED ORDER — BISACODYL 10 MG RE SUPP
10.0000 mg | Freq: Every day | RECTAL | Status: DC | PRN
  Administered 2020-03-09: 10 mg via RECTAL
  Filled 2020-02-28: qty 1

## 2020-02-28 MED ORDER — ONDANSETRON HCL 4 MG/2ML IJ SOLN
4.0000 mg | Freq: Four times a day (QID) | INTRAMUSCULAR | Status: DC | PRN
  Administered 2020-02-29 – 2020-03-06 (×4): 4 mg via INTRAVENOUS
  Filled 2020-02-28 (×3): qty 2

## 2020-02-28 MED ORDER — DIAZEPAM 2 MG PO TABS
2.0000 mg | ORAL_TABLET | Freq: Four times a day (QID) | ORAL | Status: DC | PRN
  Administered 2020-03-10: 2 mg via ORAL
  Filled 2020-02-28: qty 1

## 2020-02-28 MED ORDER — DIPHENHYDRAMINE HCL 12.5 MG/5ML PO ELIX: 12.5000 mg | ORAL_SOLUTION | ORAL | Status: DC | PRN

## 2020-02-28 MED ORDER — PANTOPRAZOLE SODIUM 40 MG PO TBEC
40.0000 mg | DELAYED_RELEASE_TABLET | Freq: Every day | ORAL | Status: DC
  Administered 2020-02-29 – 2020-03-12 (×13): 40 mg via ORAL
  Filled 2020-02-28 (×13): qty 1

## 2020-02-28 MED ORDER — LIDOCAINE HCL (PF) 2 % IJ SOLN
INTRAMUSCULAR | Status: AC
  Filled 2020-02-28: qty 10

## 2020-02-28 MED ORDER — HYDROCHLOROTHIAZIDE 25 MG PO TABS
25.0000 mg | ORAL_TABLET | Freq: Every day | ORAL | Status: DC
  Administered 2020-02-29 – 2020-03-12 (×10): 25 mg via ORAL
  Filled 2020-02-28 (×12): qty 1

## 2020-02-28 MED ORDER — DIMETHYL FUMARATE 240 MG PO CPDR
240.0000 mg | DELAYED_RELEASE_CAPSULE | Freq: Two times a day (BID) | ORAL | Status: DC
  Administered 2020-02-28 – 2020-03-12 (×26): 240 mg via ORAL
  Filled 2020-02-28 (×26): qty 1

## 2020-02-28 MED ORDER — LEVOTHYROXINE SODIUM 50 MCG PO TABS
50.0000 ug | ORAL_TABLET | Freq: Every day | ORAL | Status: DC
  Administered 2020-02-29 – 2020-03-12 (×13): 50 ug via ORAL
  Filled 2020-02-28 (×13): qty 1

## 2020-02-28 MED ORDER — OXYCODONE HCL 5 MG PO TABS
10.0000 mg | ORAL_TABLET | ORAL | Status: DC | PRN
  Administered 2020-03-04: 22:00:00 15 mg via ORAL
  Filled 2020-02-28: qty 2
  Filled 2020-02-28: qty 3

## 2020-02-28 MED ORDER — PROMETHAZINE HCL 25 MG/ML IJ SOLN: 6.2500 mg | INTRAMUSCULAR | Status: DC | PRN

## 2020-02-28 MED ORDER — HYDROCHLOROTHIAZIDE 12.5 MG PO CAPS
12.5000 mg | ORAL_CAPSULE | Freq: Every day | ORAL | Status: DC
  Administered 2020-02-28 – 2020-03-12 (×12): 12.5 mg via ORAL
  Filled 2020-02-28 (×13): qty 1

## 2020-02-28 MED ORDER — ACETAMINOPHEN 10 MG/ML IV SOLN
INTRAVENOUS | Status: DC | PRN
  Administered 2020-02-28: 1000 mg via INTRAVENOUS

## 2020-02-28 MED ORDER — ENOXAPARIN SODIUM 40 MG/0.4ML ~~LOC~~ SOLN
40.0000 mg | SUBCUTANEOUS | Status: DC
  Administered 2020-02-29 – 2020-03-12 (×13): 40 mg via SUBCUTANEOUS
  Filled 2020-02-28 (×13): qty 0.4

## 2020-02-28 MED ORDER — MORPHINE SULFATE (PF) 4 MG/ML IV SOLN
INTRAVENOUS | Status: AC
  Filled 2020-02-28: qty 1

## 2020-02-28 MED ORDER — HYDROMORPHONE HCL 1 MG/ML IJ SOLN
INTRAMUSCULAR | Status: DC | PRN
  Administered 2020-02-28: .5 mg via INTRAVENOUS
  Administered 2020-02-28: .25 mg via INTRAVENOUS

## 2020-02-28 MED ORDER — INSULIN ASPART 100 UNIT/ML ~~LOC~~ SOLN: 4.0000 [IU] | Freq: Three times a day (TID) | SUBCUTANEOUS | Status: DC

## 2020-02-28 MED ORDER — ACETAMINOPHEN 500 MG PO TABS
1000.0000 mg | ORAL_TABLET | Freq: Four times a day (QID) | ORAL | Status: AC
  Administered 2020-02-28 – 2020-02-29 (×3): 1000 mg via ORAL
  Filled 2020-02-28 (×3): qty 2

## 2020-02-28 MED ORDER — NEOMYCIN-POLYMYXIN B GU 40-200000 IR SOLN
Status: AC
  Filled 2020-02-28: qty 20

## 2020-02-28 MED ORDER — ADULT MULTIVITAMIN W/MINERALS CH
1.0000 | ORAL_TABLET | Freq: Every day | ORAL | Status: DC
  Administered 2020-02-28 – 2020-03-11 (×13): 1 via ORAL
  Filled 2020-02-28 (×16): qty 1

## 2020-02-28 MED ORDER — SODIUM CHLORIDE FLUSH 0.9 % IV SOLN
INTRAVENOUS | Status: AC
  Filled 2020-02-28: qty 10

## 2020-02-28 MED ORDER — VALSARTAN-HYDROCHLOROTHIAZIDE 320-12.5 MG PO TABS: 1.0000 | ORAL_TABLET | Freq: Every day | ORAL | Status: DC

## 2020-02-28 MED ORDER — LIDOCAINE HCL (CARDIAC) PF 100 MG/5ML IV SOSY
PREFILLED_SYRINGE | INTRAVENOUS | Status: DC | PRN
  Administered 2020-02-28: 100 mg via INTRAVENOUS

## 2020-02-28 MED ORDER — ROCURONIUM BROMIDE 100 MG/10ML IV SOLN
INTRAVENOUS | Status: DC | PRN
  Administered 2020-02-28: 20 mg via INTRAVENOUS
  Administered 2020-02-28: 50 mg via INTRAVENOUS

## 2020-02-28 MED ORDER — PROMETHAZINE HCL 25 MG/ML IJ SOLN
INTRAMUSCULAR | Status: AC
  Filled 2020-02-28: qty 1

## 2020-02-28 MED ORDER — INSULIN ASPART 100 UNIT/ML ~~LOC~~ SOLN
0.0000 [IU] | Freq: Three times a day (TID) | SUBCUTANEOUS | Status: DC
  Administered 2020-02-29 – 2020-03-01 (×2): 3 [IU] via SUBCUTANEOUS
  Filled 2020-02-28 (×2): qty 1

## 2020-02-28 MED ORDER — ONDANSETRON HCL 4 MG PO TABS
4.0000 mg | ORAL_TABLET | Freq: Four times a day (QID) | ORAL | Status: DC | PRN
  Administered 2020-03-04 – 2020-03-09 (×3): 4 mg via ORAL
  Filled 2020-02-28 (×5): qty 1

## 2020-02-28 MED ORDER — FLUOXETINE HCL 20 MG PO CAPS
60.0000 mg | ORAL_CAPSULE | Freq: Every day | ORAL | Status: DC
  Administered 2020-02-29 – 2020-03-12 (×13): 60 mg via ORAL
  Filled 2020-02-28 (×14): qty 3

## 2020-02-28 MED ORDER — HYDROMORPHONE HCL 1 MG/ML IJ SOLN: 0.5000 mg | INTRAMUSCULAR | Status: DC | PRN

## 2020-02-28 MED ORDER — MIRABEGRON ER 50 MG PO TB24
50.0000 mg | ORAL_TABLET | Freq: Every day | ORAL | Status: DC
  Administered 2020-02-29 – 2020-03-12 (×13): 50 mg via ORAL
  Filled 2020-02-28 (×14): qty 1

## 2020-02-28 SURGICAL SUPPLY — 69 items
BLADE SAW 90X13X1.19 OSCILLAT (BLADE) ×2 IMPLANT
BLADE SAW 90X25X1.19 OSCILLAT (BLADE) ×2 IMPLANT
BNDG ESMARK 6X12 TAN STRL LF (GAUZE/BANDAGES/DRESSINGS) ×1 IMPLANT
CANISTER SUCT 3000ML PPV (MISCELLANEOUS) ×2 IMPLANT
CEMENT HV SMART SET (Cement) ×4 IMPLANT
CEMENT TIBIA MBT (Knees) IMPLANT
CNTNR SPEC 2.5X3XGRAD LEK (MISCELLANEOUS) ×1
CONT SPEC 4OZ STER OR WHT (MISCELLANEOUS) ×1
CONTAINER SPEC 2.5X3XGRAD LEK (MISCELLANEOUS) ×1 IMPLANT
COOLER POLAR GLACIER W/PUMP (MISCELLANEOUS) ×2 IMPLANT
COVER WAND RF STERILE (DRAPES) ×2 IMPLANT
CUFF TOURN SGL QUICK 24 (TOURNIQUET CUFF) ×1
CUFF TOURN SGL QUICK 30 (TOURNIQUET CUFF)
CUFF TRNQT CYL 24X4X16.5-23 (TOURNIQUET CUFF) IMPLANT
CUFF TRNQT CYL 30X4X21-28X (TOURNIQUET CUFF) IMPLANT
DRAPE 3/4 80X56 (DRAPES) ×4 IMPLANT
DRAPE IMP U-DRAPE 54X76 (DRAPES) ×4 IMPLANT
DRAPE INCISE IOBAN 66X60 STRL (DRAPES) ×2 IMPLANT
DRAPE SURG 17X11 SM STRL (DRAPES) ×4 IMPLANT
DRSG OPSITE POSTOP 4X12 (GAUZE/BANDAGES/DRESSINGS) ×2 IMPLANT
DRSG OPSITE POSTOP 4X14 (GAUZE/BANDAGES/DRESSINGS) ×2 IMPLANT
DURAPREP 26ML APPLICATOR (WOUND CARE) ×6 IMPLANT
ELECT BLADE 6.5 EXT (BLADE) ×1 IMPLANT
ELECT REM PT RETURN 9FT ADLT (ELECTROSURGICAL) ×2
ELECTRODE REM PT RTRN 9FT ADLT (ELECTROSURGICAL) ×1 IMPLANT
FEMUR SIGMA PS SZ 3.0 R (Femur) ×1 IMPLANT
GAUZE SPONGE 4X4 12PLY STRL (GAUZE/BANDAGES/DRESSINGS) ×2 IMPLANT
GLOVE BIOGEL PI IND STRL 9 (GLOVE) ×1 IMPLANT
GLOVE BIOGEL PI INDICATOR 9 (GLOVE) ×1
GLOVE SURG 9.0 ORTHO LTXF (GLOVE) ×4 IMPLANT
GOWN STRL REUS TWL 2XL XL LVL4 (GOWN DISPOSABLE) ×2 IMPLANT
GOWN STRL REUS W/ TWL LRG LVL3 (GOWN DISPOSABLE) ×1 IMPLANT
GOWN STRL REUS W/ TWL LRG LVL4 (GOWN DISPOSABLE) ×1 IMPLANT
GOWN STRL REUS W/TWL LRG LVL3 (GOWN DISPOSABLE) ×1
GOWN STRL REUS W/TWL LRG LVL4 (GOWN DISPOSABLE) ×1
HOLDER FOLEY CATH W/STRAP (MISCELLANEOUS) ×2 IMPLANT
IMMBOLIZER KNEE 19 BLUE UNIV (SOFTGOODS) ×2 IMPLANT
INSERT TIBIAL PFC SIG SZ3 10MM (Knees) ×1 IMPLANT
KIT TURNOVER KIT A (KITS) ×2 IMPLANT
MANIFOLD NEPTUNE II (INSTRUMENTS) ×2 IMPLANT
NDL SAFETY ECLIPSE 18X1.5 (NEEDLE) ×1 IMPLANT
NDL SPNL 20GX3.5 QUINCKE YW (NEEDLE) ×1 IMPLANT
NEEDLE HYPO 18GX1.5 SHARP (NEEDLE) ×1
NEEDLE HYPO 22GX1.5 SAFETY (NEEDLE) ×2 IMPLANT
NEEDLE SPNL 20GX3.5 QUINCKE YW (NEEDLE) ×2 IMPLANT
NS IRRIG 1000ML POUR BTL (IV SOLUTION) ×2 IMPLANT
PACK TOTAL KNEE (MISCELLANEOUS) ×2 IMPLANT
PAD WRAPON POLAR KNEE (MISCELLANEOUS) ×1 IMPLANT
PATELLA DOME PFC 32MM (Knees) ×1 IMPLANT
PENCIL SMOKE ULTRAEVAC 22 CON (MISCELLANEOUS) ×2 IMPLANT
PIN STEINMAN FIXATION KNEE (PIN) ×1 IMPLANT
PULSAVAC PLUS IRRIG FAN TIP (DISPOSABLE) ×2
SOL .9 NS 3000ML IRR  AL (IV SOLUTION) ×1
SOL .9 NS 3000ML IRR UROMATIC (IV SOLUTION) ×1 IMPLANT
SPONGE LAP 18X18 RF (DISPOSABLE) ×1 IMPLANT
STAPLER SKIN PROX 35W (STAPLE) ×2 IMPLANT
SUCTION FRAZIER HANDLE 10FR (MISCELLANEOUS) ×1
SUCTION TUBE FRAZIER 10FR DISP (MISCELLANEOUS) ×1 IMPLANT
SUT ETHIBOND NAB CT1 #1 30IN (SUTURE) ×4 IMPLANT
SUT VIC AB 0 CT1 36 (SUTURE) ×2 IMPLANT
SUT VIC AB 2-0 CT1 (SUTURE) ×4 IMPLANT
SYR 20ML LL LF (SYRINGE) ×2 IMPLANT
SYR 30ML LL (SYRINGE) ×4 IMPLANT
TIBIA MBT CEMENT (Knees) ×2 IMPLANT
TIP FAN IRRIG PULSAVAC PLUS (DISPOSABLE) ×1 IMPLANT
TOWER CARTRIDGE SMART MIX (DISPOSABLE) ×2 IMPLANT
TRAY FOLEY MTR SLVR 16FR STAT (SET/KITS/TRAYS/PACK) ×2 IMPLANT
TUBE SUCT KAM VAC (TUBING) ×2 IMPLANT
WRAPON POLAR PAD KNEE (MISCELLANEOUS) ×2

## 2020-02-28 NOTE — Evaluation (Signed)
Physical Therapy Evaluation Patient Details Name: Ashlee Fields MRN: [id number] DOB: 05/21/1956 Today's Date: 02/28/2020   History of Present Illness  64 y/o female s/p R total knee replacement.  Pt has MS with significant L sided involvement, h/o distant L TKA.  Clinical Impression  Pt did very well with PT assessment post R TKA.  She has significant R sided limitations from MS; needing AFO, very weak L hand/wrist strength. Pt needed assist with bed mobility and in getting to standing but actually did better than expected with both. No KI during transfer to allow use of (stronger) R post-op knee.  Pt apparently has not done any walking of distance in recent months, but very likely will be able to increase from her baseline with continued PT in a short-term rehab setting.     Follow Up Recommendations SNF    Equipment Recommendations  (TBD at next venue of care)    Recommendations for Other Services       Precautions / Restrictions Precautions Precautions: Fall;Knee Required Braces or Orthoses: Knee Immobilizer - Right(in bed for ROM, no KI during mobility) Restrictions Weight Bearing Restrictions: Yes RLE Weight Bearing: Weight bearing as tolerated      Mobility  Bed Mobility Overal bed mobility: Needs Assistance Bed Mobility: Supine to Sit     Supine to sit: Min assist     General bed mobility comments: great effort with heavy use of rails, 2/2 poor L grip strength she struggled to get to fully upright, ultimately needed direct assist to attain sitting  Transfers Overall transfer level: Needs assistance Equipment used: Rolling walker (2 wheeled) Transfers: Sit to/from Stand Sit to Stand: Min assist;Mod assist         General transfer comment: Pt able to rise to standing with plenty of cuing for set up and sequencing as well as physical assist to get hips forward and attain standing (L AFO donned, placed L UE on RW at set up to aid with  positioning)  Ambulation/Gait Ambulation/Gait assistance: Min assist Gait Distance (Feet): 5 Feet Assistive device: Rolling walker (2 wheeled)       General Gait Details: Pt with slow and deliberate steppage (AFO on L, able to maintain L hand on RW w/o direct assist)  She had no buckling/LOBs with slow but safe effort.  Stairs            Wheelchair Mobility    Modified Rankin (Stroke Patients Only)       Balance Overall balance assessment: Modified Independent;History of Falls(heavily reliant on the walker in standing, no LOBs)                                           Pertinent Vitals/Pain Pain Assessment: 0-10 Pain Score: 6  Pain Location: R knee, initially minimal ache, increases with activity    Home Living Family/patient expects to be discharged to:: Skilled nursing facility Living Arrangements: Spouse/significant other                    Prior Function Level of Independence: Needs assistance   Gait / Transfers Assistance Needed: Pt apparently as not walked more than 5-10 feet in the last 6+ months - uses scooter  ADL's / Homemaking Assistance Needed: husband gives her showers (she stands holding rails), assists with most ADLs to varying degree        Hand  Dominance   Dominant Hand: Right    Extremity/Trunk Assessment   Upper Extremity Assessment Upper Extremity Assessment: (R 4/5, L with very poor strength, resting tone)    Lower Extremity Assessment Lower Extremity Assessment: (L ankle resting inversion tone, grossly 3-/5, R WFL s/p TKA)       Communication   Communication: No difficulties  Cognition Arousal/Alertness: Awake/alert Behavior During Therapy: WFL for tasks assessed/performed Overall Cognitive Status: Within Functional Limits for tasks assessed                                 General Comments: Pt very eager to work with PT      General Comments      Exercises Total Joint  Exercises Ankle Circles/Pumps: AROM;10 reps Quad Sets: Strengthening;10 reps Short Arc Quad: Strengthening;10 reps Heel Slides: Strengthening;10 reps(with resisted leg extensions) Hip ABduction/ADduction: Strengthening;10 reps Straight Leg Raises: AROM;10 reps Knee Flexion: PROM;5 reps Goniometric ROM: 0-111(AROM)   Assessment/Plan    PT Assessment Patient needs continued PT services  PT Problem List Decreased strength;Decreased range of motion;Decreased activity tolerance;Decreased balance;Decreased mobility;Decreased coordination;Decreased cognition;Decreased knowledge of use of DME;Decreased safety awareness;Pain       PT Treatment Interventions DME instruction;Gait training;Functional mobility training;Therapeutic activities;Therapeutic exercise;Balance training;Neuromuscular re-education;Patient/family education    PT Goals (Current goals can be found in the Care Plan section)  Acute Rehab PT Goals Patient Stated Goal: get back to walking PT Goal Formulation: With patient Time For Goal Achievement: 03/13/20 Potential to Achieve Goals: Good    Frequency BID   Barriers to discharge        Co-evaluation               AM-PAC PT "6 Clicks" Mobility  Outcome Measure Help needed turning from your back to your side while in a flat bed without using bedrails?: A Little Help needed moving from lying on your back to sitting on the side of a flat bed without using bedrails?: A Little Help needed moving to and from a bed to a chair (including a wheelchair)?: A Little Help needed standing up from a chair using your arms (e.g., wheelchair or bedside chair)?: A Little Help needed to walk in hospital room?: A Lot Help needed climbing 3-5 steps with a railing? : Total 6 Click Score: 15    End of Session Equipment Utilized During Treatment: Gait belt(R KI donned post session for extension positioning) Activity Tolerance: Patient tolerated treatment well Patient left: with chair  alarm set;with call bell/phone within reach;with family/visitor present Nurse Communication: Mobility status PT Visit Diagnosis: Muscle weakness (generalized) (M62.81);Difficulty in walking, not elsewhere classified (R26.2);Pain Pain - Right/Left: Right Pain - part of body: Knee    Time: 4287-6811 PT Time Calculation (min) (ACUTE ONLY): 53 min   Charges:   PT Evaluation $PT Eval Moderate Complexity: 1 Mod PT Treatments $Gait Training: 8-22 mins $Therapeutic Exercise: 8-22 mins        Kreg Shropshire, DPT 02/28/2020, 6:34 PM

## 2020-02-28 NOTE — Anesthesia Postprocedure Evaluation (Signed)
Anesthesia Post Note  Patient: Ashlee Fields  Procedure(s) Performed: TOTAL KNEE ARTHROPLASTY (Right Knee)  Patient location during evaluation: PACU Anesthesia Type: General Level of consciousness: awake and alert Pain management: pain level controlled Vital Signs Assessment: post-procedure vital signs reviewed and stable Respiratory status: spontaneous breathing, nonlabored ventilation, respiratory function stable and patient connected to nasal cannula oxygen Cardiovascular status: blood pressure returned to baseline and stable Postop Assessment: no apparent nausea or vomiting Anesthetic complications: no     Last Vitals:  Vitals:   02/28/20 1234 02/28/20 1252  BP: (!) 142/76   Pulse: 95   Resp: 19   Temp:    SpO2: 95% 97%    Last Pain:  Vitals:   02/28/20 1231  TempSrc:   PainSc: Asleep                 [name]

## 2020-02-28 NOTE — Anesthesia Procedure Notes (Signed)
Procedure Name: Intubation Date/Time: 02/28/2020 8:04 AM Performed by: Johnna Acosta, CRNA Pre-anesthesia Checklist: Patient identified, Emergency Drugs available, Suction available, Patient being monitored and Timeout performed Patient Re-evaluated:Patient Re-evaluated prior to induction Oxygen Delivery Method: Circle system utilized Preoxygenation: Pre-oxygenation with 100% oxygen Induction Type: IV induction Ventilation: Mask ventilation without difficulty and Oral airway inserted - appropriate to patient size Laryngoscope Size: McGraph and 3 Grade View: Grade I Tube type: Oral Tube size: 7.0 mm Number of attempts: 1 Airway Equipment and Method: Stylet,  Video-laryngoscopy and Oral airway Placement Confirmation: ETT inserted through vocal cords under direct vision,  positive ETCO2 and breath sounds checked- equal and bilateral Secured at: 21 cm Tube secured with: Tape Dental Injury: Teeth and Oropharynx as per pre-operative assessment  Difficulty Due To: Difficulty was anticipated, Difficult Airway- due to large tongue, Difficult Airway- due to reduced neck mobility and Difficult Airway- due to limited oral opening

## 2020-02-28 NOTE — Anesthesia Preprocedure Evaluation (Addendum)
Anesthesia Evaluation  Patient identified by MRN, date of birth, ID band Patient awake    Reviewed: Allergy & Precautions, H&P , NPO status , Patient's Chart, lab work & pertinent test results, reviewed documented beta blocker date and time   History of Anesthesia Complications Negative for: history of anesthetic complications  Airway Mallampati: III  TM Distance: >3 FB Neck ROM: full    Dental  (+) Teeth Intact, Caps, Dental Advidsory Given Permanent bridge:   Pulmonary neg shortness of breath, sleep apnea and Continuous Positive Airway Pressure Ventilation , COPD,  COPD inhaler, neg recent URI, Patient abstained from smoking., former smoker,    Pulmonary exam normal        Cardiovascular Exercise Tolerance: Good hypertension, (-) angina(-) Past MI and (-) Cardiac Stents Normal cardiovascular exam(-) dysrhythmias (-) Valvular Problems/Murmurs     Neuro/Psych neg Seizures PSYCHIATRIC DISORDERS Anxiety Depression  Neuromuscular disease (MS)    GI/Hepatic Neg liver ROS, GERD  ,  Endo/Other  diabetesHypothyroidism   Renal/GU negative Renal ROS  negative genitourinary   Musculoskeletal   Abdominal   Peds  Hematology negative hematology ROS (+)   Anesthesia Other Findings Past Medical History: No date: Anxiety No date: Arthritis No date: Depression No date: Diabetes mellitus without complication (HCC) No date: GERD (gastroesophageal reflux disease) No date: Hypertension No date: Hypothyroidism 1980: Multiple sclerosis (HCC) No date: Osteoporosis No date: Sleep apnea No date: Vision abnormalities   Reproductive/Obstetrics negative OB ROS                            Anesthesia Physical Anesthesia Plan  ASA: III  Anesthesia Plan: General   Post-op Pain Management:    Induction: Intravenous  PONV Risk Score and Plan: 3 and Ondansetron, Dexamethasone and Treatment may vary due to age or  medical condition  Airway Management Planned: LMA and Oral ETT  Additional Equipment:   Intra-op Plan:   Post-operative Plan: Extubation in OR  Informed Consent: I have reviewed the patients History and Physical, chart, labs and discussed the procedure including the risks, benefits and alternatives for the proposed anesthesia with the patient or authorized representative who has indicated his/her understanding and acceptance.     Dental Advisory Given  Plan Discussed with: Anesthesiologist, CRNA and Surgeon  Anesthesia Plan Comments:        Anesthesia Quick Evaluation

## 2020-02-28 NOTE — Op Note (Signed)
DATE OF SURGERY:  02/28/2020 TIME: 11:18 AM  PATIENT NAME:  Ashlee Fields   AGE: 64 y.o.    PRE-OPERATIVE DIAGNOSIS:  m17.11 Unilateral Primary Osteoarthritis, Right knee  POST-OPERATIVE DIAGNOSIS:  Same  PROCEDURE:  Procedure(s): TOTAL KNEE ARTHROPLASTY  SURGEON:  Thornton Park, MD   ASSISTANT:  Tessa Lerner, PA  OPERATIVE IMPLANTS: Depuy PFC Sigma, Posterior Stabilized Femural component size 3, Tibia size rotating platform component size 3, Patella polyethylene 3-peg oval button size 32, with a size 3 x 10 mm polyethylene tibial insert.  EBL:  50  TOURNIQUET TIME:  119 minutes  PREOPERATIVE INDICATIONS:  Ashlee Fields is an 64 y.o. female who has a diagnosis of  m17.11 Unilateral Primary Osteoarthritis, Right knee and elected for a right total knee arthroplasty after failing nonoperative treatment.  Their knee pain significantly impacts their activity of daily living including ambulation.  Radiographs have demonstrated tricompartmental osteoarthritis joint space narrowing, osteophytes, subchondral sclerosis and cyst formation.  The risks, benefits, and alternatives were discussed at length including but not limited to the risks of infection, bleeding, nerve or blood vessel injury, knee stiffness, fracture, dislocation, loosening or failure of the hardware and the need for further surgery. Medical risks include but not limited to DVT and pulmonary embolism, myocardial infarction, stroke, pneumonia, respiratory failure and death. I discussed these risks with the patient in my office prior to the date of surgery. They understood these risks and were willing to proceed.  OPERATIVE FINDINGS AND UNIQUE ASPECTS OF THE CASE: Patient had severe tricompartmental osteoarthritis  OPERATIVE DESCRIPTION:  The patient was brought to the operative room and placed in a supine position after undergoing placement of general anesthesia.  A Foley catheter was placed.  IV antibiotics were given.  Patient received ancef 2gm and clindamycin 648m IV for infection prophylaxis. The lower extremity was prepped and draped in the usual sterile fashion.  A time out was performed to verify the patient's name, date of birth, medical record number, correct site of surgery and correct procedure to be performed. The timeout was also used to confirm the patient received antibiotics and that appropriate instruments, implants and radiographs studies were available in the room.  The leg was elevated and exsanguinated with an Esmarch and the tourniquet was inflated to 275 mmHg for 119 minutes..  A midline incision was made over the right knee. Full-thickness skin flaps were developed. A medial parapatellar arthrotomy was then made and the patella everted and the knee was brought into 90 of flexion. Hoffa's fat pad along with the cruciate ligaments and medial and lateral menisci were resected.   The distal femoral intramedullary canal was opened with a drill and the intramedullary distal femoral cutting jig was inserted into the femoral canal pinned into position. It was set at 5 degrees resecting 10 mm off the distal femur.  Care was taken to protect the collateral ligaments during distal femoral resection.  The distal femoral resection was performed with an oscillating saw. The femoral cutting guide was then removed.  The extramedullary tibial cutting guide was then placed using the anterior tibial crest and second ray of the foot as a references.  The tibial cutting guide was adjusted to allow for appropriate posterior slope.  The tibial cutting block was pinned into position. The slotted stylus was used to measure the proximal tibial resection of 10 mm off the high lateral side.  The tibial long rod alignment guide was then used to confirm position of the cutting block.  A third cross pin through the tibial cutting block was then drilled into position to allow for rotational stability. Care was taken during the  tibial resection to protect the medial and collateral ligaments.  The resected tibial bone was removed along with the posterior horns of the menisci.  The PCL was sacrificed.  Extension gap was measured with a spacer block and alignment and extension was confirmed using a long alignment rod.  The attention was then turned back to the femur. The posterior referencing distal femoral sizing guide was applied to the distal femur.  The femur was sized to be a size 3. Rotation of the referencing guide was checked with the epicondylar axis and Whitesides line. Then the 4-in-1 cutting jig was then applied to the distal femur. A stylus was used to confirm that the anterior femur would not be notched.   Then the anterior, posterior and chamfer femoral cuts were then made with an oscillating saw.  The flexion gap was then measured with a flexion spacer block and long alignment rod and was found to be symmetric with the extension gap and perpendicular to mechanical axis of the tibia.  The distal femoral preparation was completed by performing the posterior stabilized box cut using the cutting block. The entry site for the intramedullary femoral guide was filled with autologous bone graft from bone previously resected earlier in the case.  The proximal tibia plateau was then sized with trial trays. The best coverage was achieved with a size 3. This tibial tray was then pinned into position. The proximal tibia was then prepared with the reamer and keel punch.  After tibial preparation was completed, all trial components were inserted with polyethylene trials.  The knee was found to have excellent balance and full motion with a size 10 mm tibial polyethylene insert..    The attention was then turned to preparation of the patella. The thickness of the patella was measured with a caliper, the diameter measured with the patella templates.  The patella resection was then made with an oscillating saw using the patella cutting  guide.  Three peg holes for the patella component were then drilled. The trial patella was then placed. Knee was taken through a full range of motion and deemed to be stable with the trial components. All trial components were then removed. The knee capsule was then injected with Exparel.  The knee joint capsule was injected with a mixture of quarter percent Marcaine and morphine to assist with postoperative pain relief.  The joint was copiously irrigated with pulse lavage.  The final total knee arthroplasty components were then cemented into place with a 10 mm trial polyethylene insert and all excess methylmethacrylate was removed.  The joint was again copiously irrigated. After the cement had hardened the knee was again taken through a full range of motion. It was felt to be most stable with the 10 mm tibial polyethylene insert. The actual tibial polyethylene insert was then placed.   The knee was taken through a range of motion and the patella tracked well and the knee was again irrigated copiously.    The medial arthrotomy was closed with #1 Ethibond. The subcutaneous tissue closed with 0 and 2-0 vicryl, and skin approximated with staples.  A dry sterile and compressive dressing was applied.  A Polar Care was applied to the operative knee along with a knee immobilizer.  The patient was awakened and brought to the PACU in stable and satisfactory condition.  All sharp, lap  and instrument counts were correct at the conclusion the case. I spoke with the patient's husband by phone from the PACU to let them know the case had been performed without complication and the patient was stable in recovery room.

## 2020-02-28 NOTE — Progress Notes (Signed)

## 2020-02-28 NOTE — TOC Initial Note (Signed)
Transition of Care [facility]) - Initial/Assessment Note    Patient Details  Name: Ashlee Fields MRN: [id number] Date of Birth: 08-10-1956  Transition of Care [facility]) CM/SW Contact:    Elease Hashimoto, LCSW Phone Number: 02/28/2020, 2:16 PM  Clinical Narrative:  Met with pt who her husband is also at the bedside to discuss discharge plans. Pt has checked out [facility] and she would like to go for rehab if she can. Husband has assisted pt prior to admission and will again. Pt used a scooter prior to admission due to her MS and a cane. She had good and bad days. Will need to await PT eval to begin pursuing short term NHP. Pt was independent prior to admission and feels now she will need more assist when leaves here. Will continue to follow and work on discharge needs.                 Expected Discharge Plan: [place] Barriers to Discharge: Continued Medical Work up   Patient Goals and CMS Choice Patient states their goals for this hospitalization and ongoing recovery are:: I want to go to rehab at [facility].gov Compare Post Acute Care list provided to:: Patient Choice offered to / list presented to : Patient, Spouse  Expected Discharge Plan and Services Expected Discharge Plan: [place] In-house Referral: Clinical Social Work   Post Acute Care Choice: [place] Living arrangements for the past 2 months: [place]                                      Prior Living Arrangements/Services Living arrangements for the past 2 months: Single Family Home Lives with:: Spouse Patient language and need for interpreter reviewed:: No Do you feel safe going back to the place where you live?: Yes      Need for Family Participation in Patient Care: Yes (Comment) Care giver support system in place?: Yes (comment) Current home services: DME(scooter and handicapped bathroom) Criminal Activity/Legal Involvement  Pertinent to Current Situation/Hospitalization: No - Comment as needed  Activities of Daily Living Home Assistive Devices/Equipment: Electric scooter, Other (Comment) ADL Screening (condition at time of admission) Patient's cognitive ability adequate to safely complete daily activities?: Yes Is the patient deaf or have difficulty hearing?: No Does the patient have difficulty seeing, even when wearing glasses/contacts?: Yes Does the patient have difficulty concentrating, remembering, or making decisions?: No Patient able to express need for assistance with ADLs?: Yes Does the patient have difficulty dressing or bathing?: Yes Independently performs ADLs?: No Communication: Independent Dressing (OT): Needs assistance Is this a change from baseline?: Pre-admission baseline Grooming: Independent Feeding: Independent Bathing: Needs assistance Is this a change from baseline?: Pre-admission baseline Toileting: Needs assistance Does the patient have difficulty walking or climbing stairs?: Yes Weakness of Legs: Both Weakness of Arms/Hands: Left  Permission Sought/Granted Permission sought to share information with : Family Supports, Chartered certified accountant granted to share information with : Yes, Verbal Permission Granted  Share Information with NAME: Hedy Camara  Permission granted to share info w AGENCY: facilities  Permission granted to share info w Relationship: husband     Emotional Assessment Appearance:: Appears stated age Attitude/Demeanor/Rapport: Engaged, Charismatic Affect (typically observed): Adaptable, Accepting Orientation: : Oriented to Self, Oriented to Place, Oriented to  Time, Oriented to Situation Alcohol / Substance Use: Never Used Psych Involvement: No (comment)  Admission diagnosis:  S/P TKR (total knee replacement) using cement, right [Z96.651] Patient Active Problem List   Diagnosis Date Noted  . S/P TKR (total knee replacement) using cement,  right 02/28/2020  . Pulmonary hypertension ([place]) 08/05/2017  . SOB (shortness of breath) on exertion 07/10/2017  . COPD (chronic obstructive pulmonary disease) ([place]) 10/07/2016  . Fibrocystic breast disease 10/07/2016  . Hyperlipidemia, unspecified 10/07/2016  . Obesity 10/07/2016  . Osteoarthritis 10/07/2016  . Gait disturbance 10/07/2016  . Other fatigue 10/07/2016  . Left hemiplegia ([place]) 10/07/2016  . MS (multiple sclerosis) ([place]) 05/06/2016  . Diabetes mellitus without complication ([place]) 43/60/6770  . HTN, goal below 140/90 02/27/2015  . Adiposity 01/02/2015  . Anxiety 01/02/2015  . Depression 01/02/2015  . Environmental allergies 01/02/2015  . Tobacco dependence syndrome 01/02/2015   PCP:  [name], MD Pharmacy:   [id number] [address] [id number]: [phone]: [phone]    Social Determinants of Health (SDOH) Interventions    Readmission Risk Interventions No flowsheet data found.

## 2020-02-28 NOTE — Progress Notes (Signed)
Patient tolerating PO fluids and able to tolerate PO intake of hamburger and sprite at this time. Patient brought medication from home Dimethyl Furamate 211m, 81 pills taken to pharmacy. Foley intact and draining to catheter bag, No c/o pain or discomfort . Husband at bedside.

## 2020-02-28 NOTE — Progress Notes (Signed)
  Subjective:  POST-OP CHECK: Patient is seen in her hospital room.  Her nurse is at the bedside as is her husband.  Patient states that she has a mild aching pain in the right knee primarily posterior.  She has no other complaints at this time.  Objective:   VITALS:   Vitals:   02/28/20 1231 02/28/20 1234 02/28/20 1252 02/28/20 1338  BP:  (!) 142/76  (!) 141/77  Pulse: 96 95  92  Resp: 16 19    Temp:    98.9 F (37.2 C)  TempSrc:    Oral  SpO2: 95% 95% 97% 93%  Weight:      Height:        PHYSICAL EXAM: Right lower extremity Neurovascular intact Sensation intact distally Intact pulses distally Dorsiflexion/Plantar flexion intact Incision: dressing C/D/I No cellulitis present Compartment soft  LABS  Results for orders placed or performed during the hospital encounter of 02/28/20 (from the past 24 hour(s))  Glucose, capillary     Status: Abnormal   Collection Time: 02/28/20  6:21 AM  Result Value Ref Range   Glucose-Capillary 107 (H) 70 - 99 mg/dL  APTT     Status: None   Collection Time: 02/28/20  6:31 AM  Result Value Ref Range   aPTT 27 24 - 36 seconds  Protime-INR     Status: None   Collection Time: 02/28/20  6:31 AM  Result Value Ref Range   Prothrombin Time 11.9 11.4 - 15.2 seconds   INR 0.9 0.8 - 1.2  ABO/Rh     Status: None   Collection Time: 02/28/20  6:31 AM  Result Value Ref Range   ABO/RH(D)      O POS Performed at [facility], [address] [place], [place] [address]   Glucose, capillary     Status: Abnormal   Collection Time: 02/28/20 11:09 AM  Result Value Ref Range   Glucose-Capillary 192 (H) 70 - 99 mg/dL    DG Knee Right Port  Result Date: 02/28/2020 CLINICAL DATA:  Postoperative EXAM: PORTABLE RIGHT KNEE - 1-2 VIEW COMPARISON:  None. FINDINGS: Status post right knee total arthroplasty with expected overlying postoperative changes. No evidence of perihardware fracture or component malposition. IMPRESSION: Status post right knee  total arthroplasty. No evidence of hardware complication. Electronically Signed   By: Eddie Candle M.D.   On: 02/28/2020 12:28    Assessment/Plan: Day of Surgery   Active Problems:   S/P TKR (total knee replacement) using cement, right  Patient is stable from an orthopedic standpoint currently.  Patient received 24 hours of postop antibiotics.  Patient had no reaction to Ancef in the OR and has been ordered for Ancef.  Patient will have her Foley catheter removed in the morning.  Postoperative labs will be drawn in the a.m.  Patient will hopefully receive physical therapy this afternoon and continue with that tomorrow.  She will take Lovenox for DVT prophylaxis beginning tomorrow.  I reviewed the postoperative x-rays which demonstrate the prosthesis is well-positioned and there is no evidence of postop complication.  Patient is weightbearing as tolerated on the right lower extremity.  She will continue to use her Polar Care to help reduce swelling.  Patient should use her knee immobilizer while in bed to maintain extension.  A towel roll should be placed under the patient's right heel to help maintain extension as well.    Thornton Park , MD 02/28/2020, 1:40 PM

## 2020-02-28 NOTE — Transfer of Care (Signed)
Immediate Anesthesia Transfer of Care Note  Patient: Ashlee Fields  Procedure(s) Performed: TOTAL KNEE ARTHROPLASTY (Right Knee)  Patient Location: PACU  Anesthesia Type:General  Level of Consciousness: awake, alert  and oriented  Airway & Oxygen Therapy: Patient Spontanous Breathing and Patient connected to face mask oxygen  Post-op Assessment: Report given to RN and Post -op Vital signs reviewed and stable  Post vital signs: Reviewed and stable  Last Vitals:  Vitals Value Taken Time  BP 132/60 02/28/20 1104  Temp 36.4 C 02/28/20 1104  Pulse 103 02/28/20 1108  Resp 21 02/28/20 1108  SpO2 94 % 02/28/20 1108  Vitals shown include unvalidated device data.  Last Pain:  Vitals:   02/28/20 0659  TempSrc: Temporal  PainSc: 0-No pain         Complications: No apparent anesthesia complications

## 2020-02-28 NOTE — NC FL2 (Signed)
[place] LEVEL OF CARE SCREENING TOOL     IDENTIFICATION  Patient Name: Ashlee Fields Birthdate: [birth year] Sex: female Admission Date (Current Location): 02/28/2020  [place] and [place] Number:  Engineering geologist and Address:  [facility], [address], [place], [place] [address]      Provider Number: [id number]  Attending Physician Name and Address:  Thornton Park, MD  Relative Name and Phone Number:  Hedy Camara Wolfert-[phone]    Current Level of Care: Hospital Recommended Level of Care: [place] Prior Approval Number:    Date Approved/Denied:   PASRR Number: [id number]  Discharge Plan: Home    Current Diagnoses: Patient Active Problem List   Diagnosis Date Noted  . S/P TKR (total knee replacement) using cement, right 02/28/2020  . Pulmonary hypertension ([place]) 08/05/2017  . SOB (shortness of breath) on exertion 07/10/2017  . COPD (chronic obstructive pulmonary disease) ([place]) 10/07/2016  . Fibrocystic breast disease 10/07/2016  . Hyperlipidemia, unspecified 10/07/2016  . Obesity 10/07/2016  . Osteoarthritis 10/07/2016  . Gait disturbance 10/07/2016  . Other fatigue 10/07/2016  . Left hemiplegia ([place]) 10/07/2016  . MS (multiple sclerosis) ([place]) 05/06/2016  . Diabetes mellitus without complication ([place]) 56/21/3086  . HTN, goal below 140/90 02/27/2015  . Adiposity 01/02/2015  . Anxiety 01/02/2015  . Depression 01/02/2015  . Environmental allergies 01/02/2015  . Tobacco dependence syndrome 01/02/2015    Orientation RESPIRATION BLADDER Height & Weight     Self, Time, Situation, Place  O2(2 liters) External catheter Weight: 190 lb 0.6 oz (86.2 kg) Height:  _0  (160 cm)  BEHAVIORAL SYMPTOMS/MOOD NEUROLOGICAL BOWEL NUTRITION STATUS      Continent Diet(regular thin liquids)  AMBULATORY STATUS COMMUNICATION OF NEEDS Skin   Extensive Assist Verbally Surgical wounds                        Personal Care Assistance Level of Assistance  Bathing, Dressing Bathing Assistance: Limited assistance   Dressing Assistance: Limited assistance     Functional Limitations Info  Sight Sight Info: Impaired        SPECIAL CARE FACTORS FREQUENCY  PT (By licensed PT), OT (By licensed OT)     PT Frequency: 5x week OT Frequency: 5x week            Contractures Contractures Info: Not present    Additional Factors Info  Code Status, Allergies Code Status Info: Full Code Allergies Info: Sulfa, Pencillins, Ibuprofen           Current Medications (02/28/2020):  This is the current hospital active medication list Current Facility-Administered Medications  Medication Dose Route Frequency Provider Last Rate Last Admin  . 0.9 %  sodium chloride infusion   Intravenous Continuous Thornton Park, MD      . acetaminophen (TYLENOL) tablet 1,000 mg  1,000 mg Oral Q6H Thornton Park, MD      . Derrill Memo ON 02/29/2020] acetaminophen (TYLENOL) tablet 325-650 mg  325-650 mg Oral Q6H PRN Thornton Park, MD      . alendronate (FOSAMAX) tablet 70 mg  70 mg Oral Weekly Thornton Park, MD      . atorvastatin (LIPITOR) tablet 80 mg  80 mg Oral QHS Thornton Park, MD      . baclofen (LIORESAL) tablet 40 mg  40 mg Oral QHS Thornton Park, MD      . bisacodyl (DULCOLAX) suppository 10 mg  10 mg Rectal Daily PRN Thornton Park, MD      .  ceFAZolin (ANCEF) IVPB 1 g/50 mL premix  1 g Intravenous Q6H Thornton Park, MD      . cyanocobalamin ((VITAMIN B-12)) injection 1,000 mcg  1,000 mcg Intramuscular Q30 days Thornton Park, MD      . diazepam (VALIUM) tablet 2 mg  2 mg Oral QID PRN Thornton Park, MD      . Dimethyl Fumarate CPDR 240 mg  240 mg Oral BID Thornton Park, MD      . diphenhydrAMINE (BENADRYL) 12.5 MG/5ML elixir 12.5-25 mg  12.5-25 mg Oral Q4H PRN Thornton Park, MD      . docusate sodium (COLACE) capsule 100 mg  100 mg Oral BID Thornton Park, MD      . Derrill Memo ON  02/29/2020] enoxaparin (LOVENOX) injection 40 mg  40 mg Subcutaneous Q24H Thornton Park, MD      . FLUoxetine (PROZAC) capsule 60 mg  60 mg Oral Daily Thornton Park, MD      . gabapentin (NEURONTIN) capsule 300 mg  300 mg Oral TID Thornton Park, MD      . hydrochlorothiazide (HYDRODIURIL) tablet 25 mg  25 mg Oral Daily Thornton Park, MD      . HYDROmorphone (DILAUDID) injection 0.5-1 mg  0.5-1 mg Intravenous Q4H PRN Thornton Park, MD      . Derrill Memo ON 02/29/2020] levothyroxine (SYNTHROID) tablet 50 mcg  50 mcg Oral Q0600 Thornton Park, MD      . LORazepam (ATIVAN) tablet 1 mg  1 mg Oral BID Thornton Park, MD      . menthol-cetylpyridinium (CEPACOL) lozenge 3 mg  1 lozenge Oral PRN Thornton Park, MD       Or  . phenol (CHLORASEPTIC) mouth spray 1 spray  1 spray Mouth/Throat PRN Thornton Park, MD      . metFORMIN (GLUCOPHAGE-XR) 24 hr tablet 1,500 mg  1,500 mg Oral QHS Thornton Park, MD      . mirabegron ER [facility]) tablet 50 mg  50 mg Oral Daily Thornton Park, MD      . multivitamin with minerals tablet 1 tablet  1 tablet Oral QHS Thornton Park, MD      . ondansetron [facility]) tablet 4 mg  4 mg Oral Q6H PRN Thornton Park, MD       Or  . ondansetron [facility]) injection 4 mg  4 mg Intravenous Q6H PRN Thornton Park, MD      . oxyCODONE (Oxy IR/ROXICODONE) immediate release tablet 10-15 mg  10-15 mg Oral Q4H PRN Thornton Park, MD      . oxyCODONE (Oxy IR/ROXICODONE) immediate release tablet 5-10 mg  5-10 mg Oral Q4H PRN Thornton Park, MD      . pantoprazole (PROTONIX) EC tablet 40 mg  40 mg Oral Daily Thornton Park, MD      . Phendimetrazine Tartrate TABS 105 mg  105 mg Oral Daily Thornton Park, MD      . Derrill Memo ON 02/29/2020] predniSONE (DELTASONE) tablet 10 mg  10 mg Oral Q breakfast Thornton Park, MD      . Derrill Memo ON 03/04/2020] Semaglutide(0.25 or 0.5MG/DOS) SOPN 0.25 mg  0.25 mg Subcutaneous Q Howell Pringle, Lennette Bihari, MD      . senna-docusate  (Senokot-S) tablet 1 tablet  1 tablet Oral QHS PRN Thornton Park, MD      . sodium chloride flush 0.9 % injection           . sodium phosphate (FLEET) 7-19 GM/118ML enema 1 enema  1 enema Rectal Once PRN Thornton Park, MD      .  traMADol (ULTRAM) tablet 50 mg  50 mg Oral Q6H Thornton Park, MD      . valsartan-hydrochlorothiazide (DIOVAN-HCT) 320-12.5 MG per tablet 1 tablet  1 tablet Oral Daily Thornton Park, MD         Discharge Medications: Please see discharge summary for a list of discharge medications.  Relevant Imaging Results:  Relevant Lab Results:   Additional Information SSN: [id number]  Hinda Lindor, Gardiner Rhyme, LCSW

## 2020-02-28 NOTE — H&P (Signed)
PREOPERATIVE H&P  Chief Complaint: m17.11 Unilateral Primary Osteoarthritis, Right knee  HPI: Ashlee Fields is a 64 y.o. female with multiple sclerosis who presents for preoperative history and physical with a diagnosis of m17.11 Unilateral Primary Osteoarthritis, Right knee. Symptoms of pain, swelling and decreased ROM are significantly impairing activities of daily living including ambulation.  The patient has tried and failed nonoperative management including corticosteroid injection and hyaluronic acid injections. She wishes to proceed with a right total knee arthroplasty.   Past Medical History:  Diagnosis Date  . Anxiety   . Arthritis   . Depression   . Diabetes mellitus without complication ([place])   . GERD (gastroesophageal reflux disease)   . Hypertension   . Hypothyroidism   . Multiple sclerosis ([place]) 1980  . Osteoporosis   . Sleep apnea   . Vision abnormalities    Past Surgical History:  Procedure Laterality Date  . CESAREAN SECTION    . COLONOSCOPY    . JOINT REPLACEMENT    . REPLACEMENT TOTAL KNEE Left 2009  . RIGHT HEART CATH Right 08/05/2017   Procedure: RIGHT HEART CATH;  Surgeon: Teodoro Spray, MD;  Location: [facility];  Service: Cardiovascular;  Laterality: Right;   Social History   Socioeconomic History  . Marital status: Married    Spouse name: Not on file  . Number of children: Not on file  . Years of education: Not on file  . Highest education level: Not on file  Occupational History  . Not on file  Tobacco Use  . Smoking status: Former Smoker    Packs/day: 0.50    Types: Cigarettes    Quit date: 2018    Years since quitting: 3.2  . Smokeless tobacco: Never Used  Substance and Sexual Activity  . Alcohol use: Never  . Drug use: Never  . Sexual activity: Not on file  Other Topics Concern  . Not on file  Social History Narrative  . Not on file   Social Determinants of Health   Financial Resource Strain:   . Difficulty of Paying  Living Expenses:   Food Insecurity:   . Worried About Charity fundraiser in the Last Year:   . Arboriculturist in the Last Year:   Transportation Needs:   . Film/video editor (Medical):   Marland Kitchen Lack of Transportation (Non-Medical):   Physical Activity:   . Days of Exercise per Week:   . Minutes of Exercise per Session:   Stress:   . Feeling of Stress :   Social Connections:   . Frequency of Communication with Friends and Family:   . Frequency of Social Gatherings with Friends and Family:   . Attends Religious Services:   . Active Member of Clubs or Organizations:   . Attends Archivist Meetings:   Marland Kitchen Marital Status:    Family History  Problem Relation Age of Onset  . Breast cancer Paternal Grandmother   . Heart disease Mother   . Kidney failure Father    Allergies  Allergen Reactions  . Penicillins Hives    Did it involve swelling of the face/tongue/throat, SOB, or low BP? No Did it involve sudden or severe rash/hives, skin peeling, or any reaction on the inside of your mouth or nose? Yes Did you need to seek medical attention at a hospital or doctor's office? Unknown When did it last happen?20 + years If all above answers are "NO", may proceed with cephalosporin use.    Marland Kitchen  Sulfa Antibiotics Hives and Nausea And Vomiting  . Ibuprofen Rash   Prior to Admission medications   Medication Sig Start Date End Date Taking? Authorizing Provider  acetaminophen (TYLENOL) 500 MG tablet Take 1,500 mg by mouth 3 (three) times daily.    Yes [provider]  alendronate (FOSAMAX) 70 MG tablet Take 70 mg by mouth once a week. Take with a full glass of water on an empty stomach.  sunday   Yes [provider]  atorvastatin (LIPITOR) 80 MG tablet Take 80 mg by mouth at bedtime.   Yes [provider]  baclofen (LIORESAL) 20 MG tablet Take 40 mg by mouth at bedtime.    Yes [provider]  cyanocobalamin (,VITAMIN B-12,) 1000 MCG/ML  injection Inject 1,000 mcg into the muscle every 30 (thirty) days.  01/25/16  Yes [provider]  diazepam (VALIUM) 2 MG tablet Take 2 mg by mouth 4 (four) times daily as needed for muscle spasms.    Yes [provider]  Dimethyl Fumarate (TECFIDERA) 240 MG CPDR Take 240 mg by mouth in the morning and at bedtime.   Yes [provider]  FLUoxetine (PROZAC) 20 MG capsule Take 60 mg by mouth daily.   Yes [provider]  hydrochlorothiazide (HYDRODIURIL) 25 MG tablet Take 25 mg by mouth daily.  04/01/16  Yes [provider]  levothyroxine (SYNTHROID, LEVOTHROID) 50 MCG tablet Take 50 mcg by mouth daily before breakfast.   Yes [provider]  LORazepam (ATIVAN) 1 MG tablet Take 1 mg by mouth 2 (two) times daily.  11/30/15  Yes [provider]  metFORMIN (GLUCOPHAGE-XR) 500 MG 24 hr tablet Take 1,500 mg by mouth at bedtime.  07/26/17  Yes [provider]  mirabegron ER (MYRBETRIQ) 50 MG TB24 tablet Take 50 mg by mouth daily.   Yes [provider]  Multiple Vitamin (MULTIVITAMIN WITH MINERALS) TABS tablet Take 1 tablet by mouth at bedtime. GERITOL COMPLETE MULTIVITAMIN.    Yes [provider]  pantoprazole (PROTONIX) 40 MG tablet Take 40 mg by mouth daily.    Yes [provider]  Phendimetrazine Tartrate 35 MG TABS Take 105 mg by mouth daily.   Yes [provider]  predniSONE (DELTASONE) 10 MG tablet Take 10 mg by mouth daily with breakfast.    Yes [provider]  traMADol (ULTRAM) 50 MG tablet Take 100 mg by mouth daily as needed for moderate pain.   Yes [provider]  valsartan-hydrochlorothiazide (DIOVAN-HCT) 320-12.5 MG tablet Take 1 tablet by mouth daily.   Yes [provider]  clindamycin (CLEOCIN) 150 MG capsule Take 600 mg by mouth See admin instructions. Take 600 mg 1 hour prior to dental work    [provider]  ondansetron (ZOFRAN-ODT) 4 MG disintegrating  tablet Take 4 mg by mouth every 8 (eight) hours as needed for nausea/vomiting. 02/15/20   [provider]  Semaglutide,0.25 or 0.5MG/DOS, (OZEMPIC, 0.25 OR 0.5 MG/DOSE,) 2 MG/1.5ML SOPN Inject 0.25 mg into the skin every Sunday.    [provider]     Positive ROS: All other systems have been reviewed and were otherwise negative with the exception of those mentioned in the HPI and as above.  Physical Exam: General: Alert, no acute distress Cardiovascular: Regular rate and rhythm, no murmurs rubs or gallops.  No pedal edema Respiratory: Clear to auscultation bilaterally, no wheezes rales or rhonchi. No cyanosis, no use of accessory musculature GI: No organomegaly, abdomen is soft and  non-tender nondistended with positive bowel sounds. Skin: Skin intact, no lesions within the operative field. Neurologic: Sensation intact distally Psychiatric: Patient is competent for consent with normal mood and affect Lymphatic: No cervical lymphadenopathy  MUSCULOSKELETAL: Right knee: Patient skin is intact.  There is no erythema ecchymosis or large effusion today.  Patient distally has intact sensation, palpable pedal pulses and intact motor function.  Assessment: m17.11 Unilateral Primary Osteoarthritis, Right knee  Plan: Plan for Procedure(s): RIGHT TOTAL KNEE ARTHROPLASTY  I reviewed the details of the operation with the patient and her husband in clinic prior to the date of surgery.  A preop history and physical was performed in the office and updated today at the bedside.  Patient has no history of heart attack stroke or blood clots and is a candidate to use TXA.  I discussed the risks and benefits of surgery. The risks include but are not limited to infection, bleeding requiring blood transfusion, nerve or blood vessel injury, joint stiffness or loss of motion, persistent pain, weakness or instability, fracture, dislocation, hardware failure or loosening and the need for further  surgery. Medical risks include but are not limited to DVT and pulmonary embolism, myocardial infarction, stroke, pneumonia, respiratory failure and death. Patient understood these risks and wished to proceed.    Thornton Park, MD   02/28/2020 7:41 AM

## 2020-02-29 ENCOUNTER — Inpatient Hospital Stay: Payer: Medicare HMO

## 2020-02-29 DIAGNOSIS — K219 Gastro-esophageal reflux disease without esophagitis: Secondary | ICD-10-CM

## 2020-02-29 DIAGNOSIS — F418 Other specified anxiety disorders: Secondary | ICD-10-CM

## 2020-02-29 DIAGNOSIS — R319 Hematuria, unspecified: Secondary | ICD-10-CM

## 2020-02-29 DIAGNOSIS — J449 Chronic obstructive pulmonary disease, unspecified: Secondary | ICD-10-CM

## 2020-02-29 DIAGNOSIS — R310 Gross hematuria: Secondary | ICD-10-CM

## 2020-02-29 DIAGNOSIS — E039 Hypothyroidism, unspecified: Secondary | ICD-10-CM | POA: Diagnosis present

## 2020-02-29 DIAGNOSIS — E119 Type 2 diabetes mellitus without complications: Secondary | ICD-10-CM

## 2020-02-29 DIAGNOSIS — I272 Pulmonary hypertension, unspecified: Secondary | ICD-10-CM

## 2020-02-29 DIAGNOSIS — J9621 Acute and chronic respiratory failure with hypoxia: Secondary | ICD-10-CM | POA: Diagnosis present

## 2020-02-29 LAB — BASIC METABOLIC PANEL
Anion gap: 10 (ref 5–15)
BUN: 13 mg/dL (ref 8–23)
CO2: 28 mmol/L (ref 22–32)
Calcium: 8.5 mg/dL — ABNORMAL LOW (ref 8.9–10.3)
Chloride: 103 mmol/L (ref 98–111)
Creatinine, Ser: 0.63 mg/dL (ref 0.44–1.00)
GFR calc Af Amer: >60 mL/min (ref >60)
GFR calc non Af Amer: >60 mL/min (ref >60)
Glucose, Bld: 136 mg/dL — ABNORMAL HIGH (ref 70–99)
Potassium: 3.9 mmol/L (ref 3.5–5.1)
Sodium: 141 mmol/L (ref 135–145)

## 2020-02-29 LAB — GLUCOSE, CAPILLARY
Glucose-Capillary: 118 mg/dL — ABNORMAL HIGH (ref 70–99)
Glucose-Capillary: 160 mg/dL — ABNORMAL HIGH (ref 70–99)
Glucose-Capillary: 118 mg/dL — ABNORMAL HIGH (ref 70–99)
Glucose-Capillary: 117 mg/dL — ABNORMAL HIGH (ref 70–99)

## 2020-02-29 LAB — URINALYSIS, COMPLETE (UACMP) WITH MICROSCOPIC
Bacteria, UA: NONE SEEN
Bilirubin Urine: NEGATIVE
Glucose, UA: NEGATIVE mg/dL
Ketones, ur: NEGATIVE mg/dL
Leukocytes,Ua: NEGATIVE
Nitrite: NEGATIVE
Protein, ur: 100 mg/dL — AB
RBC / HPF: >50 RBC/hpf — ABNORMAL HIGH (ref 0–5)
Specific Gravity, Urine: 1.025 (ref 1.005–1.030)
Squamous Epithelial / HPF: NONE SEEN (ref 0–5)
pH: 6 (ref 5.0–8.0)

## 2020-02-29 LAB — CBC
HCT: 30.6 % — ABNORMAL LOW (ref 36.0–46.0)
Hemoglobin: 10 g/dL — ABNORMAL LOW (ref 12.0–15.0)
MCH: 29.7 pg (ref 26.0–34.0)
MCHC: 32.7 g/dL (ref 30.0–36.0)
MCV: 90.8 fL (ref 80.0–100.0)
Platelets: 343 10*3/uL (ref 150–400)
RBC: 3.37 MIL/uL — ABNORMAL LOW (ref 3.87–5.11)
RDW: 15.6 % — ABNORMAL HIGH (ref 11.5–15.5)
WBC: 12.7 10*3/uL — ABNORMAL HIGH (ref 4.0–10.5)
nRBC: 0 % (ref 0.0–0.2)

## 2020-02-29 LAB — BRAIN NATRIURETIC PEPTIDE: B Natriuretic Peptide: 154 pg/mL — ABNORMAL HIGH (ref 0.0–100.0)

## 2020-02-29 MED ORDER — HYDRALAZINE HCL 20 MG/ML IJ SOLN
5.0000 mg | INTRAMUSCULAR | Status: DC | PRN
  Filled 2020-02-29: qty 0.25

## 2020-02-29 MED ORDER — INSULIN ASPART 100 UNIT/ML ~~LOC~~ SOLN
0.0000 [IU] | Freq: Every day | SUBCUTANEOUS | Status: DC
  Administered 2020-03-10: 2 [IU] via SUBCUTANEOUS
  Filled 2020-02-29 (×3): qty 1

## 2020-02-29 MED ORDER — ONDANSETRON HCL 4 MG/2ML IJ SOLN: 4.0000 mg | Freq: Three times a day (TID) | INTRAMUSCULAR | Status: DC | PRN

## 2020-02-29 MED ORDER — DM-GUAIFENESIN ER 30-600 MG PO TB12
1.0000 | ORAL_TABLET | Freq: Two times a day (BID) | ORAL | Status: DC
  Administered 2020-02-29 – 2020-03-12 (×23): 1 via ORAL
  Filled 2020-02-29 (×24): qty 1

## 2020-02-29 MED ORDER — DOXYCYCLINE HYCLATE 100 MG PO TABS
100.0000 mg | ORAL_TABLET | Freq: Two times a day (BID) | ORAL | Status: DC
  Administered 2020-02-29: 100 mg via ORAL
  Filled 2020-02-29 (×2): qty 1

## 2020-02-29 MED ORDER — ALBUTEROL SULFATE (2.5 MG/3ML) 0.083% IN NEBU: 2.5000 mg | INHALATION_SOLUTION | RESPIRATORY_TRACT | Status: DC | PRN

## 2020-02-29 MED ORDER — IOHEXOL 300 MG/ML  SOLN
125.0000 mL | Freq: Once | INTRAMUSCULAR | Status: AC | PRN
  Administered 2020-02-29: 125 mL via INTRAVENOUS

## 2020-02-29 MED ORDER — IPRATROPIUM BROMIDE 0.02 % IN SOLN
0.5000 mg | RESPIRATORY_TRACT | Status: DC
  Administered 2020-02-29 – 2020-03-01 (×5): 0.5 mg via RESPIRATORY_TRACT
  Filled 2020-02-29 (×5): qty 2.5

## 2020-02-29 NOTE — Consult Note (Signed)
Urology Consult  I have been asked to see the patient by Dr. Mack Guise, for evaluation and management of gross hematuria.  Chief Complaint: Gross hematuria  History of Present Illness: Ashlee Fields is a 64 y.o. year old female with PMH MS, DM 2 with most recent A1c 7.5, and urinary incontinence managed with Myrbetriq 50 mg daily admitted on 02/28/2020 for scheduled right total knee arthroplasty with Dr. Mack Guise.  Foley catheter was placed perioperatively.  She was subsequently found to develop gross hematuria after surgery.  UA notable for >50 RBCs/hpf, 21-50 WBCs/hpf, and WBC clumps; urine culture pending.  We have been consulted for further evaluation.  Creatinine stable today at 0.63.  WBC count stably elevated at 12.7.  She is afebrile with stable hypertension and mildly tachycardic to the 110s.  Foley catheter in place draining clear, yellow urine.  Today, patient reports feeling well.  She denies dysuria, urgency, frequency, and gross hematuria leading into surgery.  She has no acute concerns.  Of note, patient presented to her PCP on 02/20/2020 with reports of nausea and constipation.  UA was performed which revealed 4-10 WBCs/hpf, 4-10 RBCs/hpf, and few bacteria. BMP also performed, which revealed creatinine 1.6 (baseline ~0.6).  She was prescribed Omnicef 300 mg twice daily x5 days for treatment of a presumed UTI, encouraged to start MiraLAX for management of her constipation, and diagnosed with acute renal failure.  Urine culture never sent.  BMP was repeated 2 days ago with normalization of creatinine to 0.6.  She underwent renal ultrasound on 02/24/2020 for further evaluation with no significant findings.  Additionally, patient reports 2 episodes of painless gross hematuria occurring approximately 1 year ago.  She underwent renal ultrasound on 12/21/2018 for further evaluation with no significant findings.  Cystoscopy never performed.  No abdominal cross-sectional imaging  available.  She reports an approximate 40-pack-year smoking history.  No personal or family history of bladder or renal cancers.  No history of nephrolithiasis.  No history of recurrent UTI.  Per chart review, patient has had a history of intermittent microscopic hematuria dating back to at least 2017.  No positive urine cultures per chart review.  Past Medical History:  Diagnosis Date  . Anxiety   . Arthritis   . Depression   . Diabetes mellitus without complication ([place])   . GERD (gastroesophageal reflux disease)   . Hypertension   . Hypothyroidism   . Multiple sclerosis ([place]) 1980  . Osteoporosis   . Sleep apnea   . Vision abnormalities     Past Surgical History:  Procedure Laterality Date  . CESAREAN SECTION    . COLONOSCOPY    . JOINT REPLACEMENT    . REPLACEMENT TOTAL KNEE Left 2009  . RIGHT HEART CATH Right 08/05/2017   Procedure: RIGHT HEART CATH;  Surgeon: Teodoro Spray, MD;  Location: [facility];  Service: Cardiovascular;  Laterality: Right;  . TOTAL KNEE ARTHROPLASTY Right 02/28/2020   Procedure: TOTAL KNEE ARTHROPLASTY;  Surgeon: Thornton Park, MD;  Location: [facility];  Service: Orthopedics;  Laterality: Right;    Home Medications:  Current Meds  Medication Sig  . acetaminophen (TYLENOL) 500 MG tablet Take 1,500 mg by mouth 3 (three) times daily.   Marland Kitchen alendronate (FOSAMAX) 70 MG tablet Take 70 mg by mouth once a week. Take with a full glass of water on an empty stomach.  sunday  . atorvastatin (LIPITOR) 80 MG tablet Take 80 mg by mouth at bedtime.  . baclofen (  LIORESAL) 20 MG tablet Take 40 mg by mouth at bedtime.   . cyanocobalamin (,VITAMIN B-12,) 1000 MCG/ML injection Inject 1,000 mcg into the muscle every 30 (thirty) days.   . diazepam (VALIUM) 2 MG tablet Take 2 mg by mouth 4 (four) times daily as needed for muscle spasms.   . Dimethyl Fumarate (TECFIDERA) 240 MG CPDR Take 240 mg by mouth in the morning and at bedtime.  Marland Kitchen FLUoxetine (PROZAC) 20 MG  capsule Take 60 mg by mouth daily.  . hydrochlorothiazide (HYDRODIURIL) 25 MG tablet Take 25 mg by mouth daily.   Marland Kitchen levothyroxine (SYNTHROID, LEVOTHROID) 50 MCG tablet Take 50 mcg by mouth daily before breakfast.  . LORazepam (ATIVAN) 1 MG tablet Take 1 mg by mouth 2 (two) times daily.   . metFORMIN (GLUCOPHAGE-XR) 500 MG 24 hr tablet Take 1,500 mg by mouth at bedtime.   . mirabegron ER (MYRBETRIQ) 50 MG TB24 tablet Take 50 mg by mouth daily.  . Multiple Vitamin (MULTIVITAMIN WITH MINERALS) TABS tablet Take 1 tablet by mouth at bedtime. GERITOL COMPLETE MULTIVITAMIN.   . pantoprazole (PROTONIX) 40 MG tablet Take 40 mg by mouth daily.   . Phendimetrazine Tartrate 35 MG TABS Take 105 mg by mouth daily.  . predniSONE (DELTASONE) 10 MG tablet Take 10 mg by mouth daily with breakfast.   . traMADol (ULTRAM) 50 MG tablet Take 100 mg by mouth daily as needed for moderate pain.  . valsartan-hydrochlorothiazide (DIOVAN-HCT) 320-12.5 MG tablet Take 1 tablet by mouth daily.  . [DISCONTINUED] methocarbamol (ROBAXIN) 500 MG tablet Take 500 mg by mouth daily as needed (pain).  . [DISCONTINUED] methylphenidate (RITALIN) 10 MG tablet Take 30 mg by mouth daily as needed (energy).     Allergies:  Allergies  Allergen Reactions  . Penicillins Hives    Did it involve swelling of the face/tongue/throat, SOB, or low BP? No Did it involve sudden or severe rash/hives, skin peeling, or any reaction on the inside of your mouth or nose? Yes Did you need to seek medical attention at a hospital or doctor's office? Unknown When did it last happen?20 + years If all above answers are "NO", may proceed with cephalosporin use.    . Sulfa Antibiotics Hives and Nausea And Vomiting  . Ibuprofen Rash    Family History  Problem Relation Age of Onset  . Breast cancer Paternal Grandmother   . Heart disease Mother   . Kidney failure Father     Social History:  reports that she quit smoking about 3 years ago. Her  smoking use included cigarettes. She smoked 0.50 packs per day. She has never used smokeless tobacco. She reports that she does not drink alcohol or use drugs.  ROS: A complete review of systems was performed.  All systems are negative except for pertinent findings as noted.  Physical Exam:  Vital signs in last 24 hours: Temp:  [98.9 F (37.2 C)-99.6 F (37.6 C)] 99.5 F (37.5 C) (04/07 1230) Pulse Rate:  [88-110] 110 (04/07 1230) Resp:  [16-18] 18 (04/07 1230) BP: (141-151)/(69-84) 147/69 (04/07 1230) SpO2:  [89 %-98 %] 90 % (04/07 1230) FiO2 (%):  [28 %] 28 % (04/06 1336) Constitutional: Drowsy and oriented, no acute distress HEENT: [place] AT, moist mucus membranes Cardiovascular: No clubbing, cyanosis, or edema. Respiratory: Normal respiratory effort Neurologic: Unable to assess Psychiatric: Normal mood and affect  Laboratory Data:  Recent Labs    02/29/20 0539  WBC 12.7*  HGB 10.0*  HCT 30.6*   Recent  Labs    02/29/20 0539  NA 141  K 3.9  CL 103  CO2 28  GLUCOSE 136*  BUN 13  CREATININE 0.63  CALCIUM 8.5*   Recent Labs    02/28/20 0631  INR 0.9   Urinalysis    Component Value Date/Time   COLORURINE RED (A) 02/29/2020 0130   APPEARANCEUR CLOUDY (A) 02/29/2020 0130   LABSPEC 1.025 02/29/2020 0130   PHURINE 6.0 02/29/2020 0130   GLUCOSEU NEGATIVE 02/29/2020 0130   HGBUR LARGE (A) 02/29/2020 0130   BILIRUBINUR NEGATIVE 02/29/2020 0130   KETONESUR NEGATIVE 02/29/2020 0130   PROTEINUR 100 (A) 02/29/2020 0130   NITRITE NEGATIVE 02/29/2020 0130   LEUKOCYTESUR NEGATIVE 02/29/2020 0130   Results for orders placed or performed during the hospital encounter of 02/27/20  SARS CORONAVIRUS 2 (TAT 6-24 HRS) Nasopharyngeal Nasopharyngeal Swab     Status: None   Collection Time: 02/27/20  9:39 AM   Specimen: Nasopharyngeal Swab  Result Value Ref Range Status   SARS Coronavirus 2 NEGATIVE NEGATIVE Final    Comment: (NOTE) SARS-CoV-2 target nucleic acids are NOT  DETECTED. The SARS-CoV-2 RNA is generally detectable in upper and lower respiratory specimens during the acute phase of infection. Negative results do not preclude SARS-CoV-2 infection, do not rule out co-infections with other pathogens, and should not be used as the sole basis for treatment or other patient management decisions. Negative results must be combined with clinical observations, patient history, and epidemiological information. The expected result is Negative. Fact Sheet for Patients: SugarRoll.be Fact Sheet for Healthcare Providers: https://www.woods-mathews.com/ This test is not yet approved or cleared by the [place] FDA and  has been authorized for detection and/or diagnosis of SARS-CoV-2 by FDA under an Emergency Use Authorization (EUA). This EUA will remain  in effect (meaning this test can be used) for the duration of the COVID-19 declaration under Section 56 4(b)(1) of the Act, 21 U.S.C. section 360bbb-3(b)(1), unless the authorization is terminated or revoked sooner. Performed at [facility], [place] [address], [place], [place] [address]    Radiologic Imaging: DG Knee Right Port  Result Date: 02/28/2020 CLINICAL DATA:  Postoperative EXAM: PORTABLE RIGHT KNEE - 1-2 VIEW COMPARISON:  None. FINDINGS: Status post right knee total arthroplasty with expected overlying postoperative changes. No evidence of perihardware fracture or component malposition. IMPRESSION: Status post right knee total arthroplasty. No evidence of hardware complication. Electronically Signed   By: Eddie Candle M.D.   On: 02/28/2020 12:28   Assessment & Plan:  64 year old female with PMH MS, DM 2, microscopic hematuria, urinary incontinence, and recent episode of AKI presents with gross hematuria in the setting of perioperative Foley catheterization s/p right total knee arthroplasty.  Gross hematuria resolved.  UA grossly infected with WBC clumps,  recommend empiric antibiotics for management of this.  Patient is due for hematuria work-up including CT urogram and cystoscopy given her history of smoking, intermittent microscopic hematuria, and intermittent painless gross hematuria.  As creatinine has normalized, recommend starting this while inpatient following Foley catheter removal given plans for discharge to rehab with possible extended stay.  Can plan for outpatient follow-up with cystoscopy thereafter.  Additionally, patient is at high risk of urinary retention in the setting of her DM2, MS, and beta agonist use for management of urinary incontinence.  Unclear if leakage represents overflow incontinence versus OAB.  Cannot rule out postrenal etiology of recent AKI given her risk factors and reports of constipation at the time of presentation, which may  have been contributory. Recommend follow-up bladder scan following Foley removal while inpatient to ensure she's emptying appropriately as her baseline PVR is unknown.  Recommendations: -Start empiric antibiotics for management of possible UTI, follow urine cultures -D/C Foley and follow up with bladder scan to check residual -CT urogram while inpatient (following Foley removal) -We will plan for outpatient cystoscopy and PVR  Thank you for involving me in this patient's care, I will continue to follow along.  Debroah Loop, PA-C 02/29/2020 1:20 PM

## 2020-02-29 NOTE — Progress Notes (Signed)
Inpatient Diabetes Program Recommendations  AACE/ADA: New Consensus Statement on Inpatient Glycemic Control (2015)  Target Ranges:  Prepandial:   less than 140 mg/dL      Peak postprandial:   less than 180 mg/dL (1-2 hours)      Critically ill patients:  140 - 180 mg/dL   Lab Results  Component Value Date   GLUCAP 160 (H) 02/29/2020    Review of Glycemic Control  Diabetes history: DM2 Outpatient Diabetes medications: Ozempic 0.25 mg q Sunday + Metformin 1500 mg q d Current orders for Inpatient glycemic control: Metformin 1500 mg q hs +Novolog moderate correction tid  Inpatient Diabetes Program Recommendations:   Received consult regarding Novolog correction scale while in the hospital. Agree with Novolog moderate correction tid.  Add Novolog correction scale 0-5 units q hs  Thank you, Nani Gasser. Torez Beauregard, RN, MSN, CDE  Diabetes Coordinator Inpatient Glycemic Control Team Team Pager [phone] (8am-5pm) 02/29/2020 1:24 PM

## 2020-02-29 NOTE — Consult Note (Addendum)
Medical Consultation   Ashlee Fields  EKC:[id number]  DOB: Dec 12, 1955  DOA: 02/28/2020  PCP: [name], MD  Outpatient Specialists:   Requesting physician: Dr. Mack Guise of ortho  Reason for consultation: -pt has cough and SOB   History of Present Illness: Ashlee Fields is an 64 y.o. female with a past medical history of hypertension, hyperlipidemia, diabetes mellitus, COPD, GERD, hypothyroidism, depression with anxiety, OSA on CPAP, multiple sclerosis, pulmonary hypertension, who is admitted by orthopedic surgeon, Dr. Mack Guise for left knee surgery.  Patient is day 1 s/p right total knee arthroplasty today. We are asked to consult due to cough and SOB.  Per Dr. Mack Guise, pt was noted to have cough and SOB today. Her oxygen saturation is 87% on room air, 93 to 94% on 2 L nasal cannula oxygen.  Patient is not using nasal cannula oxygen at home. Pt states that she has productive cough, shortness of breath.  Denies chest pain, fever or chills.  No nausea, vomiting, diarrhea, abdominal pain, symptoms of UTI. She states that she has chronic hematuria intermittently at home. She developed gross hematuria after the surgery. UA has demonstrated protein and elevated hemoglobin as well as clumped white blood cells.  Nephrology and urology have been consulted by ortho. Work up is going on.   Lab: BNP 154.  Electrolytes renal function okay.  WBC 12.7, INR 0.9, negative Covid PCR 02/27/2020, temperature 99.5, blood pressure 147/69, tachycardia.  Chest x-ray showed cardiomegaly and vascular congestion.   Review of Systems:  General: no fevers, chills, no changes in body weight, no changes in appetite Skin: no rash HEENT: no blurry vision, hearing changes or sore throat Pulm: has dyspnea, coughing, no wheezing CV: no chest pain, palpitations, shortness of breath Abd: no nausea/vomiting, abdominal pain, diarrhea/constipation GU: no dysuria, hematuria, polyuria Ext: s/p of  right total knee arthroplasty. Has trace leg edema bilaterally Neuro: no weakness, numbness, or tingling    Past Medical History: Past Medical History:  Diagnosis Date  . Anxiety   . Arthritis   . Depression   . Diabetes mellitus without complication ([place])   . GERD (gastroesophageal reflux disease)   . Hypertension   . Hypothyroidism   . Multiple sclerosis ([place]) 1980  . Osteoporosis   . Sleep apnea   . Vision abnormalities     Past Surgical History: Past Surgical History:  Procedure Laterality Date  . CESAREAN SECTION    . COLONOSCOPY    . JOINT REPLACEMENT    . REPLACEMENT TOTAL KNEE Left 2009  . RIGHT HEART CATH Right 08/05/2017   Procedure: RIGHT HEART CATH;  Surgeon: Teodoro Spray, MD;  Location: [facility];  Service: Cardiovascular;  Laterality: Right;  . TOTAL KNEE ARTHROPLASTY Right 02/28/2020   Procedure: TOTAL KNEE ARTHROPLASTY;  Surgeon: Thornton Park, MD;  Location: [facility];  Service: Orthopedics;  Laterality: Right;     Allergies:   Allergies  Allergen Reactions  . Penicillins Hives    Did it involve swelling of the face/tongue/throat, SOB, or low BP? No Did it involve sudden or severe rash/hives, skin peeling, or any reaction on the inside of your mouth or nose? Yes Did you need to seek medical attention at a hospital or doctor's office? Unknown When did it last happen?20 + years If all above answers are "NO", may proceed with cephalosporin use.    . Sulfa Antibiotics Hives and Nausea  And Vomiting  . Ibuprofen Rash     Social History:  reports that she quit smoking about 3 years ago. Her smoking use included cigarettes. She smoked 0.50 packs per day. She has never used smokeless tobacco. She reports that she does not drink alcohol or use drugs.   Family History: Family History  Problem Relation Age of Onset  . Breast cancer Paternal Grandmother   . Heart disease Mother   . Kidney failure Father    Physical Exam: Vitals:     02/29/20 1609 02/29/20 1655 02/29/20 1942 03/01/20 0022  BP:    (!) 150/80  Pulse:  (!) 110  (!) 107  Resp:    19  Temp:    98.2 F (36.8 C)  TempSrc:    Oral  SpO2: 94% 91% 92% 94%  Weight:      Height:       General: Not in acute distress HEENT: PERRL, EOMI, no scleral icterus, No JVD or bruit Cardiac: S1/S2, RRR, No murmurs, gallops or rubs Pulm: has fine crackles on the left side  Abd: Soft, nondistended, nontender, no rebound pain, no organomegaly, BS present Ext: s/p of right total knee arthroplasty. Has trace leg edema bilaterally Musculoskeletal: No joint deformities, erythema, or stiffness, ROM full Skin: No rashes.  Neuro: Alert and oriented X3, cranial nerves II-XII grossly intact. Psych: Patient is not psychotic, no suicidal or hemocidal ideation.  Data reviewed:  I have personally reviewed following labs and imaging studies Labs:  CBC: Recent Labs  Lab 02/29/20 0539 03/01/20 0417  WBC 12.7* 11.6*  HGB 10.0* 10.0*  HCT 30.6* 31.3*  MCV 90.8 93.2  PLT 343 657    Basic Metabolic Panel: Recent Labs  Lab 02/29/20 0539  NA 141  K 3.9  CL 103  CO2 28  GLUCOSE 136*  BUN 13  CREATININE 0.63  CALCIUM 8.5*   GFR Estimated Creatinine Clearance: 72.9 mL/min (by C-G formula based on SCr of 0.63 mg/dL). Liver Function Tests: No results for input(s): AST, ALT, ALKPHOS, BILITOT, PROT, ALBUMIN in the last 168 hours. No results for input(s): LIPASE, AMYLASE in the last 168 hours. No results for input(s): AMMONIA in the last 168 hours. Coagulation profile Recent Labs  Lab 02/24/20 1046 02/28/20 0631  INR 0.9 0.9    Cardiac Enzymes: No results for input(s): CKTOTAL, CKMB, CKMBINDEX, TROPONINI in the last 168 hours. BNP: Invalid input(s): POCBNP CBG: Recent Labs  Lab 02/28/20 2031 02/29/20 0741 02/29/20 1215 02/29/20 1632 02/29/20 2148  GLUCAP 190* 118* 160* 118* 117*   D-Dimer No results for input(s): DDIMER in the last 72 hours. Hgb A1c No  results for input(s): HGBA1C in the last 72 hours. Lipid Profile No results for input(s): CHOL, HDL, LDLCALC, TRIG, CHOLHDL, LDLDIRECT in the last 72 hours. Thyroid function studies No results for input(s): TSH, T4TOTAL, T3FREE, THYROIDAB in the last 72 hours.  Invalid input(s): FREET3 Anemia work up No results for input(s): VITAMINB12, FOLATE, FERRITIN, TIBC, IRON, RETICCTPCT in the last 72 hours. Urinalysis    Component Value Date/Time   COLORURINE RED (A) 02/29/2020 0130   APPEARANCEUR CLOUDY (A) 02/29/2020 0130   LABSPEC 1.025 02/29/2020 0130   PHURINE 6.0 02/29/2020 0130   GLUCOSEU NEGATIVE 02/29/2020 0130   HGBUR LARGE (A) 02/29/2020 0130   BILIRUBINUR NEGATIVE 02/29/2020 0130   KETONESUR NEGATIVE 02/29/2020 0130   PROTEINUR 100 (A) 02/29/2020 0130   NITRITE NEGATIVE 02/29/2020 0130   LEUKOCYTESUR NEGATIVE 02/29/2020 0130     Microbiology Recent  Results (from the past 240 hour(s))  Surgical pcr screen     Status: None   Collection Time: 02/24/20 10:46 AM   Specimen: Nasal Mucosa; Nasal Swab  Result Value Ref Range Status   MRSA, PCR NEGATIVE NEGATIVE Final   Staphylococcus aureus NEGATIVE NEGATIVE Final    Comment: (NOTE) The Xpert SA Assay (FDA approved for NASAL specimens in patients 59 years of age and older), is one component of a comprehensive surveillance program. It is not intended to diagnose infection nor to guide or monitor treatment. Performed at [facility], [place], [place] [address]   SARS CORONAVIRUS 2 (TAT 6-24 HRS) Nasopharyngeal Nasopharyngeal Swab     Status: None   Collection Time: 02/27/20  9:39 AM   Specimen: Nasopharyngeal Swab  Result Value Ref Range Status   SARS Coronavirus 2 NEGATIVE NEGATIVE Final    Comment: (NOTE) SARS-CoV-2 target nucleic acids are NOT DETECTED. The SARS-CoV-2 RNA is generally detectable in upper and lower respiratory specimens during the acute phase of infection. Negative results do  not preclude SARS-CoV-2 infection, do not rule out co-infections with other pathogens, and should not be used as the sole basis for treatment or other patient management decisions. Negative results must be combined with clinical observations, patient history, and epidemiological information. The expected result is Negative. Fact Sheet for Patients: SugarRoll.be Fact Sheet for Healthcare Providers: https://www.woods-mathews.com/ This test is not yet approved or cleared by the [place] FDA and  has been authorized for detection and/or diagnosis of SARS-CoV-2 by FDA under an Emergency Use Authorization (EUA). This EUA will remain  in effect (meaning this test can be used) for the duration of the COVID-19 declaration under Section 56 4(b)(1) of the Act, 21 U.S.C. section 360bbb-3(b)(1), unless the authorization is terminated or revoked sooner. Performed at [facility], [place] [address] [place], [place] [address]        Inpatient Medications:   Scheduled Meds: . atorvastatin  80 mg Oral QHS  . baclofen  40 mg Oral QHS  . cyanocobalamin  1,000 mcg Intramuscular Q30 days  . dextromethorphan-guaiFENesin  1 tablet Oral BID  . Dimethyl Fumarate  240 mg Oral BID  . docusate sodium  100 mg Oral BID  . doxycycline  100 mg Oral Q12H  . enoxaparin (LOVENOX) injection  40 mg Subcutaneous Q24H  . FLUoxetine  60 mg Oral Daily  . gabapentin  300 mg Oral TID  . hydrochlorothiazide  25 mg Oral Daily  . hydrochlorothiazide  12.5 mg Oral Daily   And  . irbesartan  300 mg Oral Daily  . insulin aspart  0-15 Units Subcutaneous TID WC  . insulin aspart  0-5 Units Subcutaneous QHS  . ipratropium  0.5 mg Nebulization Q4H  . levothyroxine  50 mcg Oral Q0600  . LORazepam  1 mg Oral BID  . mirabegron ER  50 mg Oral Daily  . multivitamin with minerals  1 tablet Oral QHS  . pantoprazole  40 mg Oral Daily  . predniSONE  10 mg Oral Q breakfast  .  traMADol  50 mg Oral Q6H   Continuous Infusions:    Radiological Exams on Admission: DG CHEST PORT 1 VIEW  Result Date: 02/29/2020 CLINICAL DATA:  Productive cough EXAM: PORTABLE CHEST 1 VIEW COMPARISON:  07/20/2016 FINDINGS: Mild cardiomegaly. Pulmonary vascular prominence. The visualized skeletal structures are unremarkable. IMPRESSION: Mild cardiomegaly with pulmonary vascular prominence but without overt pulmonary edema. No focal airspace opacity. Electronically Signed  By: Eddie Candle M.D.   On: 02/29/2020 16:54   DG Knee Right Port  Result Date: 02/28/2020 CLINICAL DATA:  Postoperative EXAM: PORTABLE RIGHT KNEE - 1-2 VIEW COMPARISON:  None. FINDINGS: Status post right knee total arthroplasty with expected overlying postoperative changes. No evidence of perihardware fracture or component malposition. IMPRESSION: Status post right knee total arthroplasty. No evidence of hardware complication. Electronically Signed   By: Eddie Candle M.D.   On: 02/28/2020 12:28    Impression/Recommendations Principal Problem:   S/P TKR (total knee replacement) using cement, right Active Problems:   MS (multiple sclerosis) (HCC)   COPD exacerbation (HCC)   Depression with anxiety   Diabetes mellitus without complication (HCC)   HTN, goal below 140/90   Hyperlipidemia, unspecified   Hypothyroidism   GERD (gastroesophageal reflux disease)   Hematuria   Acute on chronic respiratory failure with hypoxia (HCC)    S/P TKR (total knee replacement) using cement, right: day 1 s/p of surgery -pain management per orthopedic surgery team  Acute on chronic respiratory failure with hypoxia due to possible COPD exacerbation: Patient has productive.  Chest x-ray has no infiltration.  Patient does not have any chest pain.  Low suspicion for for PE. -Bronchodilators -Doxycycline 100 mb bid -Mucinex for cough  -Incentive spirometry -sputum culture -Nasal cannula oxygen as needed to maintain O2 saturation 93%  or greater -hold off IVF  Depression and anxiety: Stable, no suicidal or homicidal ideations. -Continue home medications  Diabetes mellitus without complication ([place]): Most recent A1c 7.5 on 01/30/20, poorly controled. Patient is taking Metformin semaglutide at home -SSI  HTN:  -Continue home medications: HCTZ and irbesartan -hydralazine prn  Hyperlipidemia, unspecified -lipitor  Hypothyroidism -Synthroid  GERD (gastroesophageal reflux disease) -Protonix  MS:  -continue home meds: Dimethyl fumarate (Tecfidera)  OSA: -on CPAP  Hematuria: -urology is consulted -->work up is going on.  Thank you for this consultation.  Our [facility] hospitalist team will follow the patient with you.   Time Spent:  30 min  Ivor Costa M.D. [facility] 03/01/2020, 7:05 AM

## 2020-02-29 NOTE — Progress Notes (Signed)
PT Cancellation Note  Patient Details Name: Ashlee Fields MRN: [id number] DOB: [birth year]   Cancelled Treatment:     PT attempt 2 x this afternoon. Pt refused. She reports feeling very fatigued and requested therapist return in morning. Pt states her MS has been, " acting up" since  morning session. PT will continue to follow pt per POC and will return in morning.   Willette Pa 02/29/2020, 4:24 PM

## 2020-02-29 NOTE — Progress Notes (Signed)
Physical Therapy Treatment Patient Details Name: Ashlee Fields MRN: [id number] DOB: [birth year] Today's Date: 02/29/2020    History of Present Illness 64 y/o female s/p R total knee replacement.  Pt has MS with significant L sided involvement, h/o distant L TKA.    PT Comments    Decreased tolerance for mobility and R LE WBing this date due to pain; unable to tolerate standing or SPT for bed/chair, requiring lateral scoot pivot transfer, max assist +2 to complete.  Remains very motivated to participate/progress as able; anticipate consistent progression as pain better controlled.    Follow Up Recommendations  SNF     Equipment Recommendations       Recommendations for Other Services       Precautions / Restrictions Precautions Precautions: Fall;Knee Required Braces or Orthoses: Knee Immobilizer - Right Knee Immobilizer - Right: (when outside of therapy) Restrictions Weight Bearing Restrictions: Yes RLE Weight Bearing: Weight bearing as tolerated    Mobility  Bed Mobility Overal bed mobility: Needs Assistance Bed Mobility: Supine to Sit     Supine to sit: Min assist;Mod assist     General bed mobility comments: assist for management of L hemi-body  Transfers Overall transfer level: Needs assistance   Transfers: Lateral/Scoot Transfers          Lateral/Scoot Transfers: Max assist;+2 physical assistance General transfer comment: unable to tolerate standing attempts this AM due to pain in R knee; extensive assist for lift off and lateral movement with scoot pivot towards R  Ambulation/Gait             General Gait Details: unsafe/unable; unable to tolerate standing/WBing R LE due to pain   Stairs             Wheelchair Mobility    Modified Rankin (Stroke Patients Only)       Balance Overall balance assessment: Needs assistance Sitting-balance support: No upper extremity supported;Feet supported Sitting balance-Leahy Scale: Good                                      Cognition Arousal/Alertness: Awake/alert Behavior During Therapy: WFL for tasks assessed/performed Overall Cognitive Status: Within Functional Limits for tasks assessed                                 General Comments: Pt very eager to work with PT      Exercises Other Exercises Other Exercises: Supine R LE therex, 1x10, act assist ROM: ankle pumps, quad sets, SAQs, heel slides, hip abduct/adduct.  Difficulty with isolated R quad activation due to pain this date. Other Exercises: Sit/stand attempts from edge of bed, max/dep assist. Unable to lift/clear buttocks, limited tolerance for WBing R LE due to pain this date.    General Comments        Pertinent Vitals/Pain Pain Assessment: Faces Faces Pain Scale: Hurts whole lot Pain Location: R knee Pain Descriptors / Indicators: Aching;Sore Pain Intervention(s): Limited activity within patient's tolerance;Monitored during session;Premedicated before session;Repositioned    Home Living                      Prior Function            PT Goals (current goals can now be found in the care plan section) Acute Rehab PT Goals Patient Stated Goal: get back to  walking PT Goal Formulation: With patient Time For Goal Achievement: 03/13/20 Potential to Achieve Goals: Good Progress towards PT goals: Progressing toward goals    Frequency    BID      PT Plan Current plan remains appropriate    Co-evaluation              AM-PAC PT "6 Clicks" Mobility   Outcome Measure  Help needed turning from your back to your side while in a flat bed without using bedrails?: A Little Help needed moving from lying on your back to sitting on the side of a flat bed without using bedrails?: A Lot Help needed moving to and from a bed to a chair (including a wheelchair)?: A Lot Help needed standing up from a chair using your arms (e.g., wheelchair or bedside chair)?: Total Help  needed to walk in hospital room?: Total Help needed climbing 3-5 steps with a railing? : Total 6 Click Score: 10    End of Session Equipment Utilized During Treatment: Gait belt Activity Tolerance: Patient tolerated treatment well Patient left: with chair alarm set;with call bell/phone within reach;with family/visitor present Nurse Communication: Mobility status PT Visit Diagnosis: Muscle weakness (generalized) (M62.81);Difficulty in walking, not elsewhere classified (R26.2);Pain Pain - Right/Left: Right Pain - part of body: Knee     Time: 5638-9373 PT Time Calculation (min) (ACUTE ONLY): 33 min  Charges:  $Therapeutic Exercise: 8-22 mins $Therapeutic Activity: 8-22 mins                     Sadeel Fiddler H. Owens Shark, PT, DPT, NCS 02/29/20, 9:39 AM [phone]

## 2020-02-29 NOTE — Progress Notes (Signed)
Inpatient Rehabilitation Admissions Coordinator  Rehab Admissions Coordinator Note:  Patient was screened by Cleatrice Burke for appropriateness for an Inpatient Acute Rehab admit at [facility] in [place]. Noted change in therapy recommendations today due to her potential and with her MS. I have contacted SW, [facility] for clarification.  At this time, recommend rehab consult order to be placed to pursue. I have requested from Dr. Mack Guise.     Cleatrice Burke RN MSN 02/29/2020, 11:45 AM  I can be reached at [phone].

## 2020-02-29 NOTE — Progress Notes (Signed)
Physical Therapy Treatment Patient Details Name: Ashlee Fields MRN: [id number] DOB: Apr 05, 1956 Today's Date: 02/29/2020    History of Present Illness 64 y/o female s/p R total knee replacement.  Pt has MS with significant L sided involvement, h/o distant L TKA.    PT Comments    Seen per patient request for return to bed.  Requiring max assist +2 to complete transfer towards L; very limited ability to actively assist with L hemi-body. Increased L UE/LE tone noted at this session, likely exacerbated by pain. Given impact of MS on current recovery path, feel patient could benefit from transition to CIR upon discharge from acute hospitalization.  Discharge recommendations updated to reflect current status; TOC team informed/aware.    Follow Up Recommendations  CIR     Equipment Recommendations       Recommendations for Other Services       Precautions / Restrictions Precautions Precautions: Fall;Knee Required Braces or Orthoses: Knee Immobilizer - Right Knee Immobilizer - Right: (when outside of therapy) Restrictions Weight Bearing Restrictions: Yes RLE Weight Bearing: Weight bearing as tolerated    Mobility  Bed Mobility Overal bed mobility: Needs Assistance Bed Mobility: Supine to Sit     Supine to sit: Mod assist;Max assist     General bed mobility comments: assist for LE elevation over edge of bed  Transfers Overall transfer level: Needs assistance   Transfers: Lateral/Scoot Transfers          Lateral/Scoot Transfers: Max assist;Total assist;+2 physical assistance General transfer comment: lateral scoot towards L, significant difficulty with movement transition; very limited use of L hemi-body during transfer  Ambulation/Gait             General Gait Details: unsafe/unable; unable to tolerate standing/WBing R LE due to pain   Stairs             Wheelchair Mobility    Modified Rankin (Stroke Patients Only)       Balance Overall  balance assessment: Needs assistance Sitting-balance support: No upper extremity supported;Feet supported Sitting balance-Leahy Scale: Fair                                      Cognition Arousal/Alertness: Awake/alert Behavior During Therapy: WFL for tasks assessed/performed Overall Cognitive Status: Within Functional Limits for tasks assessed                                 General Comments: Pt very eager to work with PT      Exercises Other Exercises Other Exercises: Supine R LE therex, 1x10, act assist ROM: ankle pumps, quad sets, SAQs, heel slides, hip abduct/adduct.  Difficulty with isolated R quad activation due to pain this date. Other Exercises: Sit/stand attempts from edge of bed, max/dep assist. Unable to lift/clear buttocks, limited tolerance for WBing R LE due to pain this date.    General Comments        Pertinent Vitals/Pain Pain Assessment: Faces Faces Pain Scale: Hurts whole lot Pain Location: R knee Pain Descriptors / Indicators: Aching;Sore Pain Intervention(s): Limited activity within patient's tolerance;Monitored during session;Repositioned;Patient requesting pain meds-RN notified;RN gave pain meds during session    Home Living                      Prior Function  PT Goals (current goals can now be found in the care plan section) Acute Rehab PT Goals Patient Stated Goal: get back to walking PT Goal Formulation: With patient Time For Goal Achievement: 03/13/20 Potential to Achieve Goals: Good Progress towards PT goals: Progressing toward goals    Frequency    BID      PT Plan Discharge plan needs to be updated    Co-evaluation              AM-PAC PT "6 Clicks" Mobility   Outcome Measure  Help needed turning from your back to your side while in a flat bed without using bedrails?: A Little Help needed moving from lying on your back to sitting on the side of a flat bed without using  bedrails?: A Lot Help needed moving to and from a bed to a chair (including a wheelchair)?: A Lot Help needed standing up from a chair using your arms (e.g., wheelchair or bedside chair)?: Total Help needed to walk in hospital room?: Total Help needed climbing 3-5 steps with a railing? : Total 6 Click Score: 10    End of Session Equipment Utilized During Treatment: Gait belt Activity Tolerance: Patient limited by pain Patient left: in bed;with call bell/phone within reach;with bed alarm set Nurse Communication: Mobility status PT Visit Diagnosis: Muscle weakness (generalized) (M62.81);Difficulty in walking, not elsewhere classified (R26.2);Pain Pain - Right/Left: Right Pain - part of body: Knee     Time: 5075-7322 PT Time Calculation (min) (ACUTE ONLY): 13 min  Charges:  $Therapeutic Exercise: 8-22 mins $Therapeutic Activity: 8-22 mins                     Damel Querry H. Owens Shark, PT, DPT, NCS 02/29/20, 11:30 AM [phone]

## 2020-02-29 NOTE — Evaluation (Signed)
Occupational Therapy Evaluation Patient Details Name: Ashlee Fields MRN: [id number] DOB: 1956-06-28 Today's Date: 02/29/2020    History of Present Illness 64 y/o female s/p R total knee replacement.  Pt has MS with significant L sided involvement, h/o distant L TKA.   Clinical Impression   Patient sitting up in recliner and agreeable to therapy.  Patient states pain is at 8/10 and is getting worse with sitting up.  Patient tearful at times due to pain.  Previously, patient ranged in ability to participate in self care secondary to MS diagnosis.  L UE and LE are affected and demonstrate increased tone with pain and with difficult tasks.  Patient required MAX A x 2/TOTAL A to perform lateral scooting transfer at this time.  Limited ability to participate with L UE and R LE secondary to pain and increased tone.  Patient demonstrates motivation to participate and is able to perform self ROM techniques for L UE to increase ROM and reduce pain.  Patient presents with several deficits outlined in evaluation below and would benefit from further occupational therapy services.  Based on today's performance, recommending CIR at discharge.  Patient is agreeable to this.    Follow Up Recommendations  CIR    Equipment Recommendations  None recommended by OT    Recommendations for Other Services       Precautions / Restrictions Precautions Precautions: Fall;Knee Required Braces or Orthoses: Knee Immobilizer - Right Knee Immobilizer - Right: Other (comment)(outside of therapy) Restrictions Weight Bearing Restrictions: Yes RLE Weight Bearing: Weight bearing as tolerated      Mobility Bed Mobility Overal bed mobility: Needs Assistance Bed Mobility: Sit to Supine      Sit to supine: Max assist     Transfers Overall transfer level: Needs assistance   Transfers: Lateral/Scoot Transfers          Lateral/Scoot Transfers: Max assist;Total assist;+2 physical assistance General transfer  comment: Performed scoot transfer from recliner<>EOB going towards L side.  Extreme difficulty shifting core and lifting bottom.  Limited use of L UE d/t tone.    Balance                                      ADL either performed or assessed with clinical judgement   ADL Overall ADL's : Needs assistance/impaired                     Lower Body Dressing: Maximal assistance;Cueing for safety;Cueing for sequencing;Bed level   Toilet Transfer: Maximal assistance;+2 for safety/equipment;+2 for physical assistance Toilet Transfer Details (indicate cue type and reason): lateral scoots to drop arm BSC         Functional mobility during ADLs: (Unable to assess) General ADL Comments: Patient ranges in her ability to participate secondary to MS flair ups resulting in increased tone on general L side     Vision Patient Visual Report: No change from baseline Additional Comments: Patient reports no recent changes.     Perception     Praxis      Pertinent Vitals/Pain Pain Assessment: 0-10 Pain Score: 8  Faces Pain Scale: Hurts whole lot Pain Location: R knee (posterior aspect) Pain Descriptors / Indicators: Aching;Sore;Operative site guarding;Discomfort;Grimacing;Moaning Pain Intervention(s): Limited activity within patient's tolerance;Monitored during session;Repositioned;Patient requesting pain meds-RN notified     Hand Dominance Right   Extremity/Trunk Assessment Upper Extremity Assessment Upper Extremity Assessment: (R UE mild  weakness 4-/5.  Unable to test L UE secondary to tone from MS.  Patient often keeps hand in gross grasp/grip)   Lower Extremity Assessment Lower Extremity Assessment: Defer to PT evaluation(L LE greatly affected by MS)       Communication Communication Communication: No difficulties   Cognition Arousal/Alertness: Awake/alert Behavior During Therapy: WFL for tasks assessed/performed Overall Cognitive Status: Within Functional  Limits for tasks assessed                                 General Comments: A&O x 4, pleasant   General Comments       Exercises Other Exercises Other Exercises: Provided education on goals and role of OT in acute care setting Other Exercises: Provided education regarding surgery and common healing time/deficits seen Other Exercises: Provided education on positioning and self ROM techniques for L UE Other Exercises: Completed scooting transfer this date from recliner<>EOB with MAX A X 2/TOTAL A.  Unable to lift from seat.  Poor use of L UE.   Shoulder Instructions      Home Living Family/patient expects to be discharged to:: Other (Comment)(CIR) Living Arrangements: Spouse/significant other                                      Prior Functioning/Environment Level of Independence: Needs assistance  Gait / Transfers Assistance Needed: Pt apparently as not walked more than 5-10 feet in the last 6+ months - uses scooter ADL's / Homemaking Assistance Needed: husband gives her showers (she stands holding rails), assists with most ADLs to varying degree Communication / Swallowing Assistance Needed: n/a          OT Problem List: Decreased strength;Decreased range of motion;Decreased activity tolerance;Impaired balance (sitting and/or standing);Decreased safety awareness;Pain;Impaired UE functional use;Decreased knowledge of precautions;Decreased knowledge of use of DME or AE;Impaired tone      OT Treatment/Interventions: Self-care/ADL training;Therapeutic exercise;Energy conservation;DME and/or AE instruction;Therapeutic activities;Patient/family education    OT Goals(Current goals can be found in the care plan section) Acute Rehab OT Goals Patient Stated Goal: "Be in less pain" OT Goal Formulation: With patient Time For Goal Achievement: 03/14/20 Potential to Achieve Goals: Good  OT Frequency: Min 3X/week   Barriers to D/C:             Co-evaluation              AM-PAC OT "6 Clicks" Daily Activity     Outcome Measure Help from another person eating meals?: None Help from another person taking care of personal grooming?: None Help from another person toileting, which includes using toliet, bedpan, or urinal?: A Lot Help from another person bathing (including washing, rinsing, drying)?: Total Help from another person to put on and taking off regular upper body clothing?: A Lot Help from another person to put on and taking off regular lower body clothing?: A Lot 6 Click Score: 15   End of Session Equipment Utilized During Treatment: Gait belt  Activity Tolerance: Patient limited by pain Patient left: in bed;with call bell/phone within reach;with bed alarm set;with nursing/sitter in room;with SCD's reapplied;Other (comment)(Polar care in place)  OT Visit Diagnosis: Muscle weakness (generalized) (M62.81);Other (comment)(R TKA)                Time: 2248-2500 OT Time Calculation (min): 68 min Charges:  OT General Charges $OT  Visit: 1 Visit OT Evaluation $OT Eval Moderate Complexity: 1 Mod OT Treatments $Self Care/Home Management : 8-22 mins $Therapeutic Activity: 38-52 mins $Therapeutic Exercise: 8-22 mins  Baldomero Lamy, MS, OTR/L 02/29/20, 12:38 PM

## 2020-02-29 NOTE — TOC Progression Note (Addendum)
Transition of Care [facility]) - Progression Note    Patient Details  Name: Ashlee Fields MRN: [id number] Date of Birth: September 01, 1956  [facility] CM/SW Contact  Aileena Iglesia, Gardiner Rhyme, LCSW Phone Number: 02/29/2020, 12:15 PM  Clinical Narrative: Met with pt and husband who are both on board with her going to CIR if her insurance will cover this. She has heard from therapists that this would be better for her. Aware of the high cost MS meds, she has brought them here to take while inpatient. Will see if CIR can eval and can get insurance approval.  12:54 PM [name] has declined pt due to expensive MS medications. Made pt and husband aware  Expected Discharge Plan: [place] Barriers to Discharge: Continued Medical Work up  Expected Discharge Plan and Services Expected Discharge Plan: [place] In-house Referral: Clinical Social Work   Post Acute Care Choice: [place] Living arrangements for the past 2 months: Single Family Home                                       Social Determinants of Health (SDOH) Interventions    Readmission Risk Interventions No flowsheet data found.

## 2020-02-29 NOTE — Progress Notes (Signed)
Subjective:  POD #1 s/p right total knee arthroplasty.   Patient developed blood in her urine overnight.  UA has demonstrated protein and elevated hemoglobin as well as clumped white blood cells.  Nephrology and urology have been consulted.  Patient reports right knee pain as moderate.  Patient states she had severe pain overnight in the right knee.  She is also complaining of right-sided low back pain.  Patient states she has developed a deep cough.  She believes she may have broncitis.  Patient states she has been treated recently for bronchitis with antibiotics.  She believes she was treated with a Z-Pak.  Patient has been using her incentive spirometry.  She was able to get up out of bed to a chair with physical therapy today but has not been able to walk due to knee pain.  Patient's husband is at the bedside.  Objective:   VITALS:   Vitals:   02/28/20 2231 02/29/20 0737 02/29/20 0829 02/29/20 1230  BP: (!) 148/76 (!) 145/81  (!) 147/69  Pulse: 99 (!) 110  (!) 110  Resp: _0 Temp: 99.5 F (37.5 C) 99.5 F (37.5 C)  99.5 F (37.5 C)  TempSrc: Oral Oral  Oral  SpO2: 94% 91% 93% 90%  Weight:      Height:        PHYSICAL EXAM: Right lower extremity Neurovascular intact Sensation intact distally Intact pulses distally Dorsiflexion/Plantar flexion intact Incision: dressing C/D/I No cellulitis present Compartment soft  LABS  Results for orders placed or performed during the hospital encounter of 02/28/20 (from the past 24 hour(s))  Glucose, capillary     Status: Abnormal   Collection Time: 02/28/20  8:31 PM  Result Value Ref Range   Glucose-Capillary 190 (H) 70 - 99 mg/dL   Comment 1 Notify RN   Urinalysis, Complete w Microscopic     Status: Abnormal   Collection Time: 02/29/20  1:30 AM  Result Value Ref Range   Color, Urine RED (A) YELLOW   APPearance CLOUDY (A) CLEAR   Specific Gravity, Urine 1.025 1.005 - 1.030   pH 6.0 5.0 - 8.0   Glucose, UA NEGATIVE NEGATIVE  mg/dL   Hgb urine dipstick LARGE (A) NEGATIVE   Bilirubin Urine NEGATIVE NEGATIVE   Ketones, ur NEGATIVE NEGATIVE mg/dL   Protein, ur 100 (A) NEGATIVE mg/dL   Nitrite NEGATIVE NEGATIVE   Leukocytes,Ua NEGATIVE NEGATIVE   RBC / HPF >50 (H) 0 - 5 RBC/hpf   WBC, UA 21-50 0 - 5 WBC/hpf   Bacteria, UA NONE SEEN NONE SEEN   Squamous Epithelial / LPF NONE SEEN 0 - 5   WBC Clumps PRESENT   CBC     Status: Abnormal   Collection Time: 02/29/20  5:39 AM  Result Value Ref Range   WBC 12.7 (H) 4.0 - 10.5 K/uL   RBC 3.37 (L) 3.87 - 5.11 MIL/uL   Hemoglobin 10.0 (L) 12.0 - 15.0 g/dL   HCT 30.6 (L) 36.0 - 46.0 %   MCV 90.8 80.0 - 100.0 fL   MCH 29.7 26.0 - 34.0 pg   MCHC 32.7 30.0 - 36.0 g/dL   RDW 15.6 (H) 11.5 - 15.5 %   Platelets 343 150 - 400 K/uL   nRBC 0.0 0.0 - 0.2 %  Basic metabolic panel     Status: Abnormal   Collection Time: 02/29/20  5:39 AM  Result Value Ref Range   Sodium 141 135 - 145 mmol/L   Potassium  3.9 3.5 - 5.1 mmol/L   Chloride 103 98 - 111 mmol/L   CO2 28 22 - 32 mmol/L   Glucose, Bld 136 (H) 70 - 99 mg/dL   BUN 13 8 - 23 mg/dL   Creatinine, Ser 0.63 0.44 - 1.00 mg/dL   Calcium 8.5 (L) 8.9 - 10.3 mg/dL   GFR calc non Af Amer >60 >60 mL/min   GFR calc Af Amer >60 >60 mL/min   Anion gap 10 5 - 15  Glucose, capillary     Status: Abnormal   Collection Time: 02/29/20  7:41 AM  Result Value Ref Range   Glucose-Capillary 118 (H) 70 - 99 mg/dL   Comment 1 Notify RN   Glucose, capillary     Status: Abnormal   Collection Time: 02/29/20 12:15 PM  Result Value Ref Range   Glucose-Capillary 160 (H) 70 - 99 mg/dL   Comment 1 Notify RN     DG Knee Right Port  Result Date: 02/28/2020 CLINICAL DATA:  Postoperative EXAM: PORTABLE RIGHT KNEE - 1-2 VIEW COMPARISON:  None. FINDINGS: Status post right knee total arthroplasty with expected overlying postoperative changes. No evidence of perihardware fracture or component malposition. IMPRESSION: Status post right knee total  arthroplasty. No evidence of hardware complication. Electronically Signed   By: Eddie Candle M.D.   On: 02/28/2020 12:28    Assessment/Plan: 1 Day Post-Op   Active Problems:   S/P TKR (total knee replacement) using cement, right  Foley catheter will be removed so urology can proceed with further work-up.  Patient will have a chest x-ray to look for pneumonia given her deep cough.  Patient has a history of recent treatment for bronchitis by her primary care office.  A hospitalist consult will be obtained for further work-up of her cough.  Patient on a sliding scale for insulin.  Patient will start Lovenox for DVT prophylaxis today.  Continue physical therapy as patient can tolerate.  Her low back pain may be from positioning on the OR table and should respond to current pain management as ordered.     Thornton Park , MD 02/29/2020, 2:16 PM

## 2020-02-29 NOTE — Plan of Care (Signed)
  Problem: Education: Goal: Knowledge of General Education information will improve Description: Including pain rating scale, medication(s)/side effects and non-pharmacologic comfort measures Outcome: Progressing   Problem: Clinical Measurements: Goal: Cardiovascular complication will be avoided Outcome: Progressing   Problem: Coping: Goal: Level of anxiety will decrease Outcome: Progressing   

## 2020-02-29 NOTE — Progress Notes (Signed)
Upon entering pt room for purposes of medication administration urine in collection bag noted dark pink/red in color, urine in tubing noted dark purple with sediment noted throughout, pt denies abdominal pain, flank pain or any other changes in status, Sunday Spillers, RN made aware, pharmacy phoned to question if medication Dimethyl Fumerate could cause change in color per pt reports, pharmacy unable to confirm, upon further inspection sediment and small blood clot noted in bag and tubing, Dr. Mack Guise paged and notified of findings, verbal order received for UA, UA to be collected and MD to be informed of any abnormal results, changes or concerns in pt status, will continue to monitor patient, patient left in bed with bed in low position and call bell in reach.

## 2020-02-29 NOTE — Consult Note (Signed)
[facility]  CONSULT NOTE    Date: 02/29/2020                  Patient Name:  Ashlee Fields  MRN: [id number]  DOB: 1956-09-12  Age / Sex: 64 y.o., female         PCP: [name], MD                 Service Requesting Consult: Dr. Mack Guise                 Reason for Consult: Hematuria            History of Present Illness: Ashlee Fields  Had a right total knee replacement on 4/6 by Dr. Mack Guise. Overnight patient developed gross hematuria. She states this has happened before. She noticed red urine and some clots. She states this had happened twice in the past and it was blamed on her multiple sclerosis medication: dimethyl fumarate (Tecfidera).    Medications: Outpatient medications: Medications Prior to Admission  Medication Sig Dispense Refill Last Dose  . acetaminophen (TYLENOL) 500 MG tablet Take 1,500 mg by mouth 3 (three) times daily.    02/28/2020 at Unknown time  . alendronate (FOSAMAX) 70 MG tablet Take 70 mg by mouth once a week. Take with a full glass of water on an empty stomach.  sunday   Past Week at Unknown time  . atorvastatin (LIPITOR) 80 MG tablet Take 80 mg by mouth at bedtime.   02/27/2020 at Unknown time  . baclofen (LIORESAL) 20 MG tablet Take 40 mg by mouth at bedtime.    02/27/2020 at Unknown time  . cyanocobalamin (,VITAMIN B-12,) 1000 MCG/ML injection Inject 1,000 mcg into the muscle every 30 (thirty) days.    Past Month at Unknown time  . diazepam (VALIUM) 2 MG tablet Take 2 mg by mouth 4 (four) times daily as needed for muscle spasms.    02/27/2020 at Unknown time  . Dimethyl Fumarate (TECFIDERA) 240 MG CPDR Take 240 mg by mouth in the morning and at bedtime.   02/28/2020 at Unknown time  . FLUoxetine (PROZAC) 20 MG capsule Take 60 mg by mouth daily.   02/28/2020 at Unknown time  . hydrochlorothiazide (HYDRODIURIL) 25 MG tablet Take 25 mg by mouth daily.    Past Week at Unknown time  . levothyroxine (SYNTHROID, LEVOTHROID) 50  MCG tablet Take 50 mcg by mouth daily before breakfast.   02/28/2020 at Unknown time  . LORazepam (ATIVAN) 1 MG tablet Take 1 mg by mouth 2 (two) times daily.    02/28/2020 at Unknown time  . metFORMIN (GLUCOPHAGE-XR) 500 MG 24 hr tablet Take 1,500 mg by mouth at bedtime.   3 Past Week at Unknown time  . mirabegron ER (MYRBETRIQ) 50 MG TB24 tablet Take 50 mg by mouth daily.   02/28/2020 at Unknown time  . Multiple Vitamin (MULTIVITAMIN WITH MINERALS) TABS tablet Take 1 tablet by mouth at bedtime. GERITOL COMPLETE MULTIVITAMIN.    02/28/2020 at Unknown time  . pantoprazole (PROTONIX) 40 MG tablet Take 40 mg by mouth daily.    02/28/2020 at Unknown time  . Phendimetrazine Tartrate 35 MG TABS Take 105 mg by mouth daily.   02/27/2020 at Unknown time  . predniSONE (DELTASONE) 10 MG tablet Take 10 mg by mouth daily with breakfast.    02/28/2020 at Unknown time  . traMADol (ULTRAM) 50 MG tablet Take 100 mg by mouth daily as needed  for moderate pain.   02/27/2020 at Unknown time  . valsartan-hydrochlorothiazide (DIOVAN-HCT) 320-12.5 MG tablet Take 1 tablet by mouth daily.   Past Week at Unknown time  . clindamycin (CLEOCIN) 150 MG capsule Take 600 mg by mouth See admin instructions. Take 600 mg 1 hour prior to dental work   Not Taking at Unknown time  . ondansetron (ZOFRAN-ODT) 4 MG disintegrating tablet Take 4 mg by mouth every 8 (eight) hours as needed for nausea/vomiting.   Not Taking at Unknown time  . Semaglutide,0.25 or 0.5MG/DOS, (OZEMPIC, 0.25 OR 0.5 MG/DOSE,) 2 MG/1.5ML SOPN Inject 0.25 mg into the skin every Sunday.   Not Taking at Unknown time    Current medications: Current Facility-Administered Medications  Medication Dose Route Frequency Provider Last Rate Last Admin  . 0.9 %  sodium chloride infusion   Intravenous Continuous Thornton Park, MD   Stopped at 02/29/20 [phone]  . acetaminophen (TYLENOL) tablet 325-650 mg  325-650 mg Oral Q6H PRN Thornton Park, MD   650 mg at 02/29/20 1055  . albuterol  (PROVENTIL) (2.5 MG/3ML) 0.083% nebulizer solution 2.5 mg  2.5 mg Nebulization Q4H PRN Ivor Costa, MD      . atorvastatin (LIPITOR) tablet 80 mg  80 mg Oral QHS Thornton Park, MD   80 mg at 02/28/20 2114  . baclofen (LIORESAL) tablet 40 mg  40 mg Oral QHS Thornton Park, MD   40 mg at 02/28/20 2114  . bisacodyl (DULCOLAX) suppository 10 mg  10 mg Rectal Daily PRN Thornton Park, MD      . cyanocobalamin ((VITAMIN B-12)) injection 1,000 mcg  1,000 mcg Intramuscular Q30 days Thornton Park, MD      . dextromethorphan-guaiFENesin [facility] DM) 30-600 MG per 12 hr tablet 1 tablet  1 tablet Oral BID Ivor Costa, MD   1 tablet at 02/29/20 1519  . diazepam (VALIUM) tablet 2 mg  2 mg Oral QID PRN Thornton Park, MD      . Dimethyl Fumarate CPDR 240 mg  240 mg Oral BID Thornton Park, MD   240 mg at 02/29/20 1129  . diphenhydrAMINE (BENADRYL) 12.5 MG/5ML elixir 12.5-25 mg  12.5-25 mg Oral Q4H PRN Thornton Park, MD      . docusate sodium (COLACE) capsule 100 mg  100 mg Oral BID Thornton Park, MD   100 mg at 02/29/20 0817  . doxycycline (VIBRA-TABS) tablet 100 mg  100 mg Oral Q12H Ivor Costa, MD      . enoxaparin (LOVENOX) injection 40 mg  40 mg Subcutaneous Q24H Thornton Park, MD   40 mg at 02/29/20 0817  . FLUoxetine (PROZAC) capsule 60 mg  60 mg Oral Daily Thornton Park, MD   60 mg at 02/29/20 0815  . gabapentin (NEURONTIN) capsule 300 mg  300 mg Oral TID Thornton Park, MD   300 mg at 02/29/20 1725  . hydrALAZINE (APRESOLINE) injection 5 mg  5 mg Intravenous Q2H PRN Ivor Costa, MD      . hydrochlorothiazide (HYDRODIURIL) tablet 25 mg  25 mg Oral Daily Thornton Park, MD   25 mg at 02/29/20 0816  . hydrochlorothiazide (MICROZIDE) capsule 12.5 mg  12.5 mg Oral Daily Thornton Park, MD   12.5 mg at 02/29/20 0816   And  . irbesartan (AVAPRO) tablet 300 mg  300 mg Oral Daily Thornton Park, MD   300 mg at 02/29/20 0817  . HYDROmorphone (DILAUDID) injection 0.5-1 mg  0.5-1 mg  Intravenous Q4H PRN Thornton Park, MD      .  insulin aspart (novoLOG) injection 0-15 Units  0-15 Units Subcutaneous TID WC Thornton Park, MD   3 Units at 02/29/20 1232  . insulin aspart (novoLOG) injection 0-5 Units  0-5 Units Subcutaneous QHS Thornton Park, MD      . ipratropium (ATROVENT) nebulizer solution 0.5 mg  0.5 mg Nebulization Q4H Ivor Costa, MD   0.5 mg at 02/29/20 1609  . levothyroxine (SYNTHROID) tablet 50 mcg  50 mcg Oral Q0600 Thornton Park, MD   50 mcg at 02/29/20 0554  . LORazepam (ATIVAN) tablet 1 mg  1 mg Oral BID Thornton Park, MD   1 mg at 02/28/20 2114  . menthol-cetylpyridinium (CEPACOL) lozenge 3 mg  1 lozenge Oral PRN Thornton Park, MD       Or  . phenol (CHLORASEPTIC) mouth spray 1 spray  1 spray Mouth/Throat PRN Thornton Park, MD      . mirabegron ER [facility]) tablet 50 mg  50 mg Oral Daily Thornton Park, MD   50 mg at 02/29/20 0815  . multivitamin with minerals tablet 1 tablet  1 tablet Oral QHS Thornton Park, MD   1 tablet at 02/28/20 2114  . ondansetron (ZOFRAN) tablet 4 mg  4 mg Oral Q6H PRN Thornton Park, MD       Or  . ondansetron [facility]) injection 4 mg  4 mg Intravenous Q6H PRN Thornton Park, MD   4 mg at 02/29/20 0736  . ondansetron (ZOFRAN) injection 4 mg  4 mg Intravenous Q8H PRN Ivor Costa, MD      . oxyCODONE (Oxy IR/ROXICODONE) immediate release tablet 10-15 mg  10-15 mg Oral Q4H PRN Thornton Park, MD      . oxyCODONE (Oxy IR/ROXICODONE) immediate release tablet 5-10 mg  5-10 mg Oral Q4H PRN Thornton Park, MD   10 mg at 02/29/20 1519  . pantoprazole (PROTONIX) EC tablet 40 mg  40 mg Oral Daily Thornton Park, MD   40 mg at 02/29/20 0816  . predniSONE (DELTASONE) tablet 10 mg  10 mg Oral Q breakfast Thornton Park, MD   10 mg at 02/29/20 0816  . senna-docusate (Senokot-S) tablet 1 tablet  1 tablet Oral QHS PRN Thornton Park, MD      . sodium phosphate (FLEET) 7-19 GM/118ML enema 1 enema  1 enema Rectal Once  PRN Thornton Park, MD      . traMADol [facility]) tablet 50 mg  50 mg Oral Q6H Thornton Park, MD   50 mg at 02/29/20 1203      Allergies: Allergies  Allergen Reactions  . Penicillins Hives    Did it involve swelling of the face/tongue/throat, SOB, or low BP? No Did it involve sudden or severe rash/hives, skin peeling, or any reaction on the inside of your mouth or nose? Yes Did you need to seek medical attention at a hospital or doctor's office? Unknown When did it last happen?20 + years If all above answers are "NO", may proceed with cephalosporin use.    . Sulfa Antibiotics Hives and Nausea And Vomiting  . Ibuprofen Rash      Past Medical History: Past Medical History:  Diagnosis Date  . Anxiety   . Arthritis   . Depression   . Diabetes mellitus without complication ([place])   . GERD (gastroesophageal reflux disease)   . Hypertension   . Hypothyroidism   . Multiple sclerosis ([place]) 1980  . Osteoporosis   . Sleep apnea   . Vision abnormalities      Past Surgical History: Past Surgical History:  Procedure Laterality Date  . CESAREAN SECTION    . COLONOSCOPY    . JOINT REPLACEMENT    . REPLACEMENT TOTAL KNEE Left 2009  . RIGHT HEART CATH Right 08/05/2017   Procedure: RIGHT HEART CATH;  Surgeon: Teodoro Spray, MD;  Location: [facility];  Service: Cardiovascular;  Laterality: Right;  . TOTAL KNEE ARTHROPLASTY Right 02/28/2020   Procedure: TOTAL KNEE ARTHROPLASTY;  Surgeon: Thornton Park, MD;  Location: [facility];  Service: Orthopedics;  Laterality: Right;     Family History: Family History  Problem Relation Age of Onset  . Breast cancer Paternal Grandmother   . Heart disease Mother   . Kidney failure Father      Social History: Social History   Socioeconomic History  . Marital status: Married    Spouse name: Not on file  . Number of children: Not on file  . Years of education: Not on file  . Highest education level: Not on file   Occupational History  . Not on file  Tobacco Use  . Smoking status: Former Smoker    Packs/day: 0.50    Types: Cigarettes    Quit date: 2018    Years since quitting: 3.2  . Smokeless tobacco: Never Used  Substance and Sexual Activity  . Alcohol use: Never  . Drug use: Never  . Sexual activity: Not on file  Other Topics Concern  . Not on file  Social History Narrative  . Not on file   Social Determinants of Health   Financial Resource Strain:   . Difficulty of Paying Living Expenses:   Food Insecurity:   . Worried About Charity fundraiser in the Last Year:   . Arboriculturist in the Last Year:   Transportation Needs:   . Film/video editor (Medical):   Marland Kitchen Lack of Transportation (Non-Medical):   Physical Activity:   . Days of Exercise per Week:   . Minutes of Exercise per Session:   Stress:   . Feeling of Stress :   Social Connections:   . Frequency of Communication with Friends and Family:   . Frequency of Social Gatherings with Friends and Family:   . Attends Religious Services:   . Active Member of Clubs or Organizations:   . Attends Archivist Meetings:   Marland Kitchen Marital Status:   Intimate Partner Violence:   . Fear of Current or Ex-Partner:   . Emotionally Abused:   Marland Kitchen Physically Abused:   . Sexually Abused:      Review of Systems: Review of Systems  Constitutional: Negative.   HENT: Negative.   Eyes: Negative.   Respiratory: Negative.   Cardiovascular: Negative.   Gastrointestinal: Negative.   Genitourinary: Positive for hematuria.  Musculoskeletal: Negative.   Skin: Negative.   Neurological: Negative.   Endo/Heme/Allergies: Negative.   Psychiatric/Behavioral: Negative.     Vital Signs: Blood pressure (!) 149/78, pulse (!) 110, temperature 99.9 F (37.7 C), temperature source Oral, resp. rate 18, height _0  (1.6 m), weight 86.2 kg, SpO2 91 %.  Weight trends: Filed Weights   02/28/20 0659  Weight: 86.2 kg    Physical  Exam: General: NAD,   Head: Normocephalic, atraumatic. Moist oral mucosal membranes  Eyes: Anicteric, PERRL  Neck: Supple, trachea midline  Lungs:  Clear to auscultation  Heart: Regular rate and rhythm  Abdomen:  Soft, nontender,   Extremities: no peripheral edema.  Neurologic: Nonfocal, moving all four extremities  Skin: No lesions  Lab results: Basic Metabolic Panel: Recent Labs  Lab 02/29/20 0539  NA 141  K 3.9  CL 103  CO2 28  GLUCOSE 136*  BUN 13  CREATININE 0.63  CALCIUM 8.5*    Liver Function Tests: No results for input(s): AST, ALT, ALKPHOS, BILITOT, PROT, ALBUMIN in the last 168 hours. No results for input(s): LIPASE, AMYLASE in the last 168 hours. No results for input(s): AMMONIA in the last 168 hours.  CBC: Recent Labs  Lab 02/29/20 0539  WBC 12.7*  HGB 10.0*  HCT 30.6*  MCV 90.8  PLT 343    Cardiac Enzymes: No results for input(s): CKTOTAL, CKMB, CKMBINDEX, TROPONINI in the last 168 hours.  BNP: Invalid input(s): POCBNP  CBG: Recent Labs  Lab 02/28/20 1109 02/28/20 2031 02/29/20 0741 02/29/20 1215 02/29/20 1632  GLUCAP 192* 190* 118* 160* 118*    Microbiology: Results for orders placed or performed during the hospital encounter of 02/27/20  SARS CORONAVIRUS 2 (TAT 6-24 HRS) Nasopharyngeal Nasopharyngeal Swab     Status: None   Collection Time: 02/27/20  9:39 AM   Specimen: Nasopharyngeal Swab  Result Value Ref Range Status   SARS Coronavirus 2 NEGATIVE NEGATIVE Final    Comment: (NOTE) SARS-CoV-2 target nucleic acids are NOT DETECTED. The SARS-CoV-2 RNA is generally detectable in upper and lower respiratory specimens during the acute phase of infection. Negative results do not preclude SARS-CoV-2 infection, do not rule out co-infections with other pathogens, and should not be used as the sole basis for treatment or other patient management decisions. Negative results must be combined with clinical observations, patient  history, and epidemiological information. The expected result is Negative. Fact Sheet for Patients: SugarRoll.be Fact Sheet for Healthcare Providers: https://www.woods-mathews.com/ This test is not yet approved or cleared by the [place] FDA and  has been authorized for detection and/or diagnosis of SARS-CoV-2 by FDA under an Emergency Use Authorization (EUA). This EUA will remain  in effect (meaning this test can be used) for the duration of the COVID-19 declaration under Section 56 4(b)(1) of the Act, 21 U.S.C. section 360bbb-3(b)(1), unless the authorization is terminated or revoked sooner. Performed at [facility], [place] [address], [place], [place] [address]     Coagulation Studies: Recent Labs    02/28/20 0631  LABPROT 11.9  INR 0.9    Urinalysis: Recent Labs    02/29/20 0130  COLORURINE RED*  LABSPEC 1.025  PHURINE 6.0  GLUCOSEU NEGATIVE  HGBUR LARGE*  BILIRUBINUR NEGATIVE  KETONESUR NEGATIVE  PROTEINUR 100*  NITRITE NEGATIVE  LEUKOCYTESUR NEGATIVE      Imaging: DG CHEST PORT 1 VIEW  Result Date: 02/29/2020 CLINICAL DATA:  Productive cough EXAM: PORTABLE CHEST 1 VIEW COMPARISON:  07/20/2016 FINDINGS: Mild cardiomegaly. Pulmonary vascular prominence. The visualized skeletal structures are unremarkable. IMPRESSION: Mild cardiomegaly with pulmonary vascular prominence but without overt pulmonary edema. No focal airspace opacity. Electronically Signed   By: Eddie Candle M.D.   On: 02/29/2020 16:54   DG Knee Right Port  Result Date: 02/28/2020 CLINICAL DATA:  Postoperative EXAM: PORTABLE RIGHT KNEE - 1-2 VIEW COMPARISON:  None. FINDINGS: Status post right knee total arthroplasty with expected overlying postoperative changes. No evidence of perihardware fracture or component malposition. IMPRESSION: Status post right knee total arthroplasty. No evidence of hardware complication. Electronically Signed   By: Eddie Candle  M.D.   On: 02/28/2020 12:28      Assessment & Plan: Ashlee Fields is a 64 y.o. white female with  multiple scerlosis, sleep apnea, hypertension, diabetes mellitus, GERD, depression, osteoarthritis, who was admitted to [facility] on 02/28/2020 for S/P TKR (total knee replacement) using cement, right [Z96.651]  1. Hematuria: Gross. Patient seems to have had episodes in the past but never had a urologic work up. She states that this was attributed to her Tecfidera.  Most likely secondary to urinary tract infection.  - empiric antibiotics - follow cultures - outpatient follow up with urology     LOS: 1 Ivana Nicastro 4/7/20215:45 PM

## 2020-03-01 ENCOUNTER — Telehealth: Payer: Self-pay | Admitting: Physician Assistant

## 2020-03-01 DIAGNOSIS — Z96651 Presence of right artificial knee joint: Secondary | ICD-10-CM

## 2020-03-01 LAB — GLUCOSE, CAPILLARY
Glucose-Capillary: 108 mg/dL — ABNORMAL HIGH (ref 70–99)
Glucose-Capillary: 116 mg/dL — ABNORMAL HIGH (ref 70–99)
Glucose-Capillary: 159 mg/dL — ABNORMAL HIGH (ref 70–99)
Glucose-Capillary: 109 mg/dL — ABNORMAL HIGH (ref 70–99)

## 2020-03-01 LAB — CBC
HCT: 31.3 % — ABNORMAL LOW (ref 36.0–46.0)
Hemoglobin: 10 g/dL — ABNORMAL LOW (ref 12.0–15.0)
MCH: 29.8 pg (ref 26.0–34.0)
MCHC: 31.9 g/dL (ref 30.0–36.0)
MCV: 93.2 fL (ref 80.0–100.0)
Platelets: 333 10*3/uL (ref 150–400)
RBC: 3.36 MIL/uL — ABNORMAL LOW (ref 3.87–5.11)
RDW: 15.7 % — ABNORMAL HIGH (ref 11.5–15.5)
WBC: 11.6 10*3/uL — ABNORMAL HIGH (ref 4.0–10.5)
nRBC: 0.2 % (ref 0.0–0.2)

## 2020-03-01 LAB — URINE CULTURE: Culture: NO GROWTH

## 2020-03-01 MED ORDER — INSULIN ASPART 100 UNIT/ML ~~LOC~~ SOLN
0.0000 [IU] | Freq: Three times a day (TID) | SUBCUTANEOUS | Status: DC
  Administered 2020-03-03: 2 [IU] via SUBCUTANEOUS
  Administered 2020-03-03 – 2020-03-04 (×3): 3 [IU] via SUBCUTANEOUS
  Administered 2020-03-04: 22:00:00 2 [IU] via SUBCUTANEOUS
  Administered 2020-03-05 (×3): 3 [IU] via SUBCUTANEOUS
  Administered 2020-03-06: 2 [IU] via SUBCUTANEOUS
  Administered 2020-03-06 (×2): 3 [IU] via SUBCUTANEOUS
  Administered 2020-03-07: 5 [IU] via SUBCUTANEOUS
  Administered 2020-03-07 – 2020-03-09 (×7): 3 [IU] via SUBCUTANEOUS
  Administered 2020-03-09: 5 [IU] via SUBCUTANEOUS
  Administered 2020-03-10: 3 [IU] via SUBCUTANEOUS
  Administered 2020-03-10: 5 [IU] via SUBCUTANEOUS
  Administered 2020-03-10: 3 [IU] via SUBCUTANEOUS
  Administered 2020-03-11: 5 [IU] via SUBCUTANEOUS
  Administered 2020-03-11: 2 [IU] via SUBCUTANEOUS
  Administered 2020-03-11: 3 [IU] via SUBCUTANEOUS
  Filled 2020-03-01 (×25): qty 1

## 2020-03-01 MED ORDER — AZITHROMYCIN 250 MG PO TABS
250.0000 mg | ORAL_TABLET | Freq: Every day | ORAL | Status: AC
  Administered 2020-03-02 – 2020-03-05 (×4): 250 mg via ORAL
  Filled 2020-03-01 (×4): qty 1

## 2020-03-01 MED ORDER — IPRATROPIUM BROMIDE 0.02 % IN SOLN
0.5000 mg | Freq: Three times a day (TID) | RESPIRATORY_TRACT | Status: DC
  Administered 2020-03-01 – 2020-03-02 (×3): 0.5 mg via RESPIRATORY_TRACT
  Filled 2020-03-01 (×3): qty 2.5

## 2020-03-01 MED ORDER — AZITHROMYCIN 500 MG PO TABS
500.0000 mg | ORAL_TABLET | Freq: Every day | ORAL | Status: AC
  Administered 2020-03-01: 500 mg via ORAL
  Filled 2020-03-01: qty 1

## 2020-03-01 NOTE — Progress Notes (Signed)
Inpatient Rehab Admissions:  Inpatient Rehab Consult received.  I spoke with pt over the phone for rehabilitation assessment and to discuss goals and expectations of an inpatient rehab admission. Feel pt is an appropriate candidate for CIR given her comorbidity and current functional level. Pt would like to pursue this program if insurance approves. AC will begin insurance authorization process for possible admit.   Raechel Ache, OTR/L  Rehab Admissions Coordinator  [phone] 03/01/2020 8:56 AM

## 2020-03-01 NOTE — Progress Notes (Addendum)
Dr Mack Guise notified via text that patient has elevated heart rate and is a yellow MEWS with a score of 2. Vital signs per protocol. No response from MD.

## 2020-03-01 NOTE — Telephone Encounter (Signed)
Please schedule patient for cystoscopy with Dr. Erlene Quan approximately 1 month for evaluation of gross hematuria.

## 2020-03-01 NOTE — Progress Notes (Signed)
OT Cancellation Note  Patient Details Name: Ashlee Fields MRN: [id number] DOB: 1956/08/27   Cancelled Treatment:    Reason Eval/Treat Not Completed: Other (comment)   Upon entry, RN attempting to insert catheter.  Pt requesting author to return later.  Will follow up as available and appropriate.  Thank you.  Oren Binet 03/01/2020, 3:44 PM

## 2020-03-01 NOTE — Progress Notes (Addendum)
PROGRESS NOTE    Ashlee Fields  GYK:[id number] DOB: 09-12-1956 DOA: 02/28/2020 PCP: [name], MD   Brief Narrative:  Ashlee Fields is an 64 y.o. female with a past medical history of hypertension, hyperlipidemia, diabetes mellitus, COPD, GERD, hypothyroidism, depression with anxiety, OSA on CPAP, multiple sclerosis, pulmonary hypertension, who is admitted by orthopedic surgeon, Dr. Mack Guise for left knee surgery.  Patient is day 1 s/p right total knee arthroplasty today. We are asked to consult due to cough and SOB.  Subjective: Patient was feeling better when seen today.  She had an history of getting bronchitis before and asking to give her azithromycin instead of doxycycline as it works better for her and doxycycline always cause diarrhea.  Shortness of breath and cough is improving.  Assessment & Plan:   Principal Problem:   S/P TKR (total knee replacement) using cement, right Active Problems:   MS (multiple sclerosis) (HCC)   COPD exacerbation (HCC)   Depression with anxiety   Diabetes mellitus without complication (HCC)   HTN, goal below 140/90   Hyperlipidemia, unspecified   Hypothyroidism   GERD (gastroesophageal reflux disease)   Hematuria   Acute on chronic respiratory failure with hypoxia (HCC)  Acute on chronic respiratory failure with hypoxia. No  Prior diagnosis of COPD.  Patient is not a smoker.  She was saturating well on room air today.  She did get bronchitis quite easily and asking to switch her antibiotics to azithromycin as it always worked better for her.  Doxycycline causes diarrhea. -Disontinue the doxycycline. -Start her on Z-Pak. -Continue supportive care with Mucinex and incentive spirometry.  S/P TKR (total knee replacement) using cement, right: day 1 s/p of surgery -pain management per orthopedic surgery team.  Depression and anxiety: Stable, no suicidal or homicidal ideations. -Continue home medications  Diabetes mellitus without  complication (Fif[facility]): Most recent A1c 7.5 on 01/30/20, poorly controled. Patient is taking Metformin semaglutide at home -SSI  HTN:  -Continue home medications: HCTZ and irbesartan -hydralazine prn  Hyperlipidemia, unspecified -lipitor  Hypothyroidism -Synthroid  GERD (gastroesophageal reflux disease) -Protonix  MS:  -continue home meds: Dimethyl fumarate (Tecfidera)  OSA: -on CPAP  Objective: Vitals:   03/01/20 0847 03/01/20 1027 03/01/20 1255 03/01/20 1544  BP: (!) 133/58 122/61 126/78 (!) 148/78  Pulse: (!) 112 (!) 104 (!) 102 (!) 101  Resp: _0 Temp: 98.6 F (37 C) 99.6 F (37.6 C) 99.5 F (37.5 C) 98.8 F (37.1 C)  TempSrc:  Oral Oral Oral  SpO2: 94% 91% 94% 98%  Weight:      Height:        Intake/Output Summary (Last 24 hours) at 03/01/2020 1600 Last data filed at 03/01/2020 1546 Gross per 24 hour  Intake --  Output 1750 ml  Net -1750 ml   Filed Weights   02/28/20 0659  Weight: 86.2 kg    Examination:  General exam: Appears calm and comfortable  Respiratory system: Clear to auscultation. Respiratory effort normal. Cardiovascular system: S1 & S2 heard, RRR. No JVD, murmurs, rubs, gallops or clicks. Gastrointestinal system: Soft, nontender, nondistended, bowel sounds positive. Central nervous system: Alert and oriented. No focal neurological deficits.Symmetric 5 x 5 power. Extremities: No edema, no cyanosis, pulses intact and symmetrical. Skin: No rashes, lesions or ulcers Psychiatry: Judgement and insight appear normal. Mood & affect appropriate.    DVT prophylaxis: Lovenox Code Status: Full Family Communication: Husband was updated at bedside. Disposition Plan: Orthopedic is primary.  Consultants:  [facility] hospitalist  Procedures:  Antimicrobials:  Azithromycin  Data Reviewed: I have personally reviewed following labs and imaging studies  CBC: Recent Labs  Lab 02/29/20 0539 03/01/20 0417  WBC 12.7* 11.6*  HGB 10.0* 10.0*   HCT 30.6* 31.3*  MCV 90.8 93.2  PLT 343 233   Basic Metabolic Panel: Recent Labs  Lab 02/29/20 0539  NA 141  K 3.9  CL 103  CO2 28  GLUCOSE 136*  BUN 13  CREATININE 0.63  CALCIUM 8.5*   GFR: Estimated Creatinine Clearance: 72.9 mL/min (by C-G formula based on SCr of 0.63 mg/dL). Liver Function Tests: No results for input(s): AST, ALT, ALKPHOS, BILITOT, PROT, ALBUMIN in the last 168 hours. No results for input(s): LIPASE, AMYLASE in the last 168 hours. No results for input(s): AMMONIA in the last 168 hours. Coagulation Profile: Recent Labs  Lab 02/24/20 1046 02/28/20 0631  INR 0.9 0.9   Cardiac Enzymes: No results for input(s): CKTOTAL, CKMB, CKMBINDEX, TROPONINI in the last 168 hours. BNP (last 3 results) No results for input(s): PROBNP in the last 8760 hours. HbA1C: No results for input(s): HGBA1C in the last 72 hours. CBG: Recent Labs  Lab 02/29/20 1215 02/29/20 1632 02/29/20 2148 03/01/20 0855 03/01/20 1201  GLUCAP 160* 118* 117* 108* 116*   Lipid Profile: No results for input(s): CHOL, HDL, LDLCALC, TRIG, CHOLHDL, LDLDIRECT in the last 72 hours. Thyroid Function Tests: No results for input(s): TSH, T4TOTAL, FREET4, T3FREE, THYROIDAB in the last 72 hours. Anemia Panel: No results for input(s): VITAMINB12, FOLATE, FERRITIN, TIBC, IRON, RETICCTPCT in the last 72 hours. Sepsis Labs: No results for input(s): PROCALCITON, LATICACIDVEN in the last 168 hours.  Recent Results (from the past 240 hour(s))  Surgical pcr screen     Status: None   Collection Time: 02/24/20 10:46 AM   Specimen: Nasal Mucosa; Nasal Swab  Result Value Ref Range Status   MRSA, PCR NEGATIVE NEGATIVE Final   Staphylococcus aureus NEGATIVE NEGATIVE Final    Comment: (NOTE) The Xpert SA Assay (FDA approved for NASAL specimens in patients 11 years of age and older), is one component of a comprehensive surveillance program. It is not intended to diagnose infection nor to guide or  monitor treatment. Performed at [facility], [place], [place] [address]   SARS CORONAVIRUS 2 (TAT 6-24 HRS) Nasopharyngeal Nasopharyngeal Swab     Status: None   Collection Time: 02/27/20  9:39 AM   Specimen: Nasopharyngeal Swab  Result Value Ref Range Status   SARS Coronavirus 2 NEGATIVE NEGATIVE Final    Comment: (NOTE) SARS-CoV-2 target nucleic acids are NOT DETECTED. The SARS-CoV-2 RNA is generally detectable in upper and lower respiratory specimens during the acute phase of infection. Negative results do not preclude SARS-CoV-2 infection, do not rule out co-infections with other pathogens, and should not be used as the sole basis for treatment or other patient management decisions. Negative results must be combined with clinical observations, patient history, and epidemiological information. The expected result is Negative. Fact Sheet for Patients: SugarRoll.be Fact Sheet for Healthcare Providers: https://www.woods-mathews.com/ This test is not yet approved or cleared by the [place] FDA and  has been authorized for detection and/or diagnosis of SARS-CoV-2 by FDA under an Emergency Use Authorization (EUA). This EUA will remain  in effect (meaning this test can be used) for the duration of the COVID-19 declaration under Section 56 4(b)(1) of the Act, 21 U.S.C. section 360bbb-3(b)(1), unless the authorization is terminated or revoked sooner. Performed  at [facility], [place] [address], [place], [place] [address]   Culture, Urine     Status: None   Collection Time: 02/29/20  7:30 AM   Specimen: Urine, Catheterized  Result Value Ref Range Status   Specimen Description   Final    URINE, CATHETERIZED Performed at [facility], [address] [place], [place] [address]    Special Requests   Final    NONE Performed at [facility], [address] [place], [place] [address]     Culture   Final    NO GROWTH Performed at [facility], [place] [address], [place], [place] [address]    Report Status 03/01/2020 FINAL  Final     Radiology Studies: DG CHEST PORT 1 VIEW  Result Date: 02/29/2020 CLINICAL DATA:  Productive cough EXAM: PORTABLE CHEST 1 VIEW COMPARISON:  07/20/2016 FINDINGS: Mild cardiomegaly. Pulmonary vascular prominence. The visualized skeletal structures are unremarkable. IMPRESSION: Mild cardiomegaly with pulmonary vascular prominence but without overt pulmonary edema. No focal airspace opacity. Electronically Signed   By: Eddie Candle M.D.   On: 02/29/2020 16:54   CT HEMATURIA WORKUP  Result Date: 03/01/2020 CLINICAL DATA:  Gross hematuria. EXAM: CT ABDOMEN AND PELVIS WITHOUT AND WITH CONTRAST TECHNIQUE: Multidetector CT imaging of the abdomen and pelvis was performed following the standard protocol before and following the bolus administration of intravenous contrast. CONTRAST:  132m OMNIPAQUE IOHEXOL 300 MG/ML  SOLN COMPARISON:  Renal sonogram of 02/24/2020. FINDINGS: Lower chest: Basilar atelectasis without effusion or consolidation. Hepatobiliary: Liver is normal without focal lesion. Portal vein is patent. The gallbladder without pericholecystic stranding. Pancreas: No pancreatic abnormality, no ductal dilation, lesion or peripancreatic inflammation. Spleen: Spleen is normal size without focal lesion. Adrenals/Urinary Tract: Adrenal glands are normal. Tiny nonobstructing calculus in the upper pole the right kidney (image 31, series 2) approximately 2 mm. Similarly and interpolar calculus on the right also 2 mm. No signs of ureteral calculus. Symmetric renal enhancement. Small amount of gas in the anterior urinary bladder without signs of bladder wall thickening. Delayed images of the urinary tract show no evidence of filling defect or signs of stricture. Ureters and urinary bladder are incompletely opacified. Stomach/Bowel: Gastric distension is moderate.  Small bowel loops are fluid-filled without discrete transition point with mild dilation. Abundant stool both liquid and formed is present throughout the colon. The appendix is normal. No signs of pneumatosis or free air. No pericolonic or perienteric stranding. Vascular/Lymphatic: Calcified and noncalcified plaque throughout the abdominal aorta, no signs of aneurysm. No adenopathy. No pelvic lymphadenopathy. Reproductive: Uterus and adnexa without signs of acute process by CT. Other: Calcifications in the body wall and mild stranding in the flank, of uncertain significance potentially related to injection or prior trauma. These areas are along the posterolateral flank and or at the level of the iliac crest with thickening of overlying skin and fat stranding and soft tissue density with deeper areas in the subcutaneous fat containing calcification. Musculoskeletal: The right lower extremity is imaged to a greater extent than the left. There is stranding about the rectus femoris muscle. The contralateral side is not imaged for comparison based on scan parameters. Subacute rib fractures along the anterolateral left chest and the lateral and posterior left chest involving ribs 5 through 10. Spinal degenerative changes. IMPRESSION: 1. Nonobstructing intrarenal calculi on the right otherwise no findings to explain hematuria. Gross hematuria would not necessarily be explained by these findings. 2. Small amount of gas in the  urinary bladder, correlate with any recent urinary bladder instrumentation. 3. Global mild distension of bowel compatible with ileus. Attention on follow-up. 4. Right lower extremity is imaged to a greater extent than the left. Mild stranding about the rectus femoris muscle likely related to recent knee replacement. Correlate with any increased pain or swelling with dedicated imaging as warranted 5. Nonobstructive right nephrolithiasis. 6. Calcifications and soft tissue density in the bilateral body  wall as described. Findings may relate to prior infection, injection sites or trauma. Correlate with direct clinical inspection. 7. Subacute rib fractures along the anterolateral left chest and the lateral and posterior left chest involving ribs 5 through 10. 8. Aortic atherosclerosis. Aortic Atherosclerosis (ICD10-I70.0). Electronically Signed   By: Zetta Bills M.D.   On: 03/01/2020 08:28    Scheduled Meds: . atorvastatin  80 mg Oral QHS  . azithromycin  500 mg Oral Daily   Followed by  . [START ON 03/02/2020] azithromycin  250 mg Oral Daily  . baclofen  40 mg Oral QHS  . cyanocobalamin  1,000 mcg Intramuscular Q30 days  . dextromethorphan-guaiFENesin  1 tablet Oral BID  . Dimethyl Fumarate  240 mg Oral BID  . docusate sodium  100 mg Oral BID  . enoxaparin (LOVENOX) injection  40 mg Subcutaneous Q24H  . FLUoxetine  60 mg Oral Daily  . gabapentin  300 mg Oral TID  . hydrochlorothiazide  25 mg Oral Daily  . hydrochlorothiazide  12.5 mg Oral Daily   And  . irbesartan  300 mg Oral Daily  . insulin aspart  0-15 Units Subcutaneous TID WC  . insulin aspart  0-5 Units Subcutaneous QHS  . ipratropium  0.5 mg Nebulization TID  . levothyroxine  50 mcg Oral Q0600  . LORazepam  1 mg Oral BID  . mirabegron ER  50 mg Oral Daily  . multivitamin with minerals  1 tablet Oral QHS  . pantoprazole  40 mg Oral Daily  . predniSONE  10 mg Oral Q breakfast  . traMADol  50 mg Oral Q6H   Continuous Infusions:   LOS: 2 days   Time spent: 30 minutes  Lorella Nimrod, MD [facility]  If 7PM-7AM, please contact night-coverage [email or web address]  03/01/2020, 4:00 PM   This record has been created using Systems analyst. Errors have been sought and corrected,but may not always be located. Such creation errors do not reflect on the standard of care.

## 2020-03-01 NOTE — Progress Notes (Signed)
Subjective:  POD #2 s/p right total knee arthroplasty.   Patient reports right knee pain as mild to moderate.  Patient states her breathing is better today.  Objective:   VITALS:   Vitals:   03/01/20 0847 03/01/20 1027 03/01/20 1255 03/01/20 1544  BP: (!) 133/58 122/61 126/78 (!) 148/78  Pulse: (!) 112 (!) 104 (!) 102 (!) 101  Resp: _0 Temp: 98.6 F (37 C) 99.6 F (37.6 C) 99.5 F (37.5 C) 98.8 F (37.1 C)  TempSrc:  Oral Oral Oral  SpO2: 94% 91% 94% 98%  Weight:      Height:        PHYSICAL EXAM: Right lower extremity Neurovascular intact Sensation intact distally Intact pulses distally Dorsiflexion/Plantar flexion intact Incision: dressing C/D/I No cellulitis present Compartment soft  LABS  Results for orders placed or performed during the hospital encounter of 02/28/20 (from the past 24 hour(s))  Glucose, capillary     Status: Abnormal   Collection Time: 02/29/20  9:48 PM  Result Value Ref Range   Glucose-Capillary 117 (H) 70 - 99 mg/dL  CBC     Status: Abnormal   Collection Time: 03/01/20  4:17 AM  Result Value Ref Range   WBC 11.6 (H) 4.0 - 10.5 K/uL   RBC 3.36 (L) 3.87 - 5.11 MIL/uL   Hemoglobin 10.0 (L) 12.0 - 15.0 g/dL   HCT 31.3 (L) 36.0 - 46.0 %   MCV 93.2 80.0 - 100.0 fL   MCH 29.8 26.0 - 34.0 pg   MCHC 31.9 30.0 - 36.0 g/dL   RDW 15.7 (H) 11.5 - 15.5 %   Platelets 333 150 - 400 K/uL   nRBC 0.2 0.0 - 0.2 %  Glucose, capillary     Status: Abnormal   Collection Time: 03/01/20  8:55 AM  Result Value Ref Range   Glucose-Capillary 108 (H) 70 - 99 mg/dL   Comment 1 Notify RN    Comment 2 Document in Chart   Glucose, capillary     Status: Abnormal   Collection Time: 03/01/20 12:01 PM  Result Value Ref Range   Glucose-Capillary 116 (H) 70 - 99 mg/dL   Comment 1 Notify RN    Comment 2 Document in Chart   Glucose, capillary     Status: Abnormal   Collection Time: 03/01/20  4:54 PM  Result Value Ref Range   Glucose-Capillary 159 (H) 70 -  99 mg/dL   Comment 1 Notify RN    Comment 2 Document in Chart     DG CHEST PORT 1 VIEW  Result Date: 02/29/2020 CLINICAL DATA:  Productive cough EXAM: PORTABLE CHEST 1 VIEW COMPARISON:  07/20/2016 FINDINGS: Mild cardiomegaly. Pulmonary vascular prominence. The visualized skeletal structures are unremarkable. IMPRESSION: Mild cardiomegaly with pulmonary vascular prominence but without overt pulmonary edema. No focal airspace opacity. Electronically Signed   By: Eddie Candle M.D.   On: 02/29/2020 16:54   CT HEMATURIA WORKUP  Result Date: 03/01/2020 CLINICAL DATA:  Gross hematuria. EXAM: CT ABDOMEN AND PELVIS WITHOUT AND WITH CONTRAST TECHNIQUE: Multidetector CT imaging of the abdomen and pelvis was performed following the standard protocol before and following the bolus administration of intravenous contrast. CONTRAST:  129m OMNIPAQUE IOHEXOL 300 MG/ML  SOLN COMPARISON:  Renal sonogram of 02/24/2020. FINDINGS: Lower chest: Basilar atelectasis without effusion or consolidation. Hepatobiliary: Liver is normal without focal lesion. Portal vein is patent. The gallbladder without pericholecystic stranding. Pancreas: No pancreatic abnormality, no ductal dilation, lesion or peripancreatic inflammation. Spleen:  Spleen is normal size without focal lesion. Adrenals/Urinary Tract: Adrenal glands are normal. Tiny nonobstructing calculus in the upper pole the right kidney (image 31, series 2) approximately 2 mm. Similarly and interpolar calculus on the right also 2 mm. No signs of ureteral calculus. Symmetric renal enhancement. Small amount of gas in the anterior urinary bladder without signs of bladder wall thickening. Delayed images of the urinary tract show no evidence of filling defect or signs of stricture. Ureters and urinary bladder are incompletely opacified. Stomach/Bowel: Gastric distension is moderate. Small bowel loops are fluid-filled without discrete transition point with mild dilation. Abundant stool both  liquid and formed is present throughout the colon. The appendix is normal. No signs of pneumatosis or free air. No pericolonic or perienteric stranding. Vascular/Lymphatic: Calcified and noncalcified plaque throughout the abdominal aorta, no signs of aneurysm. No adenopathy. No pelvic lymphadenopathy. Reproductive: Uterus and adnexa without signs of acute process by CT. Other: Calcifications in the body wall and mild stranding in the flank, of uncertain significance potentially related to injection or prior trauma. These areas are along the posterolateral flank and or at the level of the iliac crest with thickening of overlying skin and fat stranding and soft tissue density with deeper areas in the subcutaneous fat containing calcification. Musculoskeletal: The right lower extremity is imaged to a greater extent than the left. There is stranding about the rectus femoris muscle. The contralateral side is not imaged for comparison based on scan parameters. Subacute rib fractures along the anterolateral left chest and the lateral and posterior left chest involving ribs 5 through 10. Spinal degenerative changes. IMPRESSION: 1. Nonobstructing intrarenal calculi on the right otherwise no findings to explain hematuria. Gross hematuria would not necessarily be explained by these findings. 2. Small amount of gas in the urinary bladder, correlate with any recent urinary bladder instrumentation. 3. Global mild distension of bowel compatible with ileus. Attention on follow-up. 4. Right lower extremity is imaged to a greater extent than the left. Mild stranding about the rectus femoris muscle likely related to recent knee replacement. Correlate with any increased pain or swelling with dedicated imaging as warranted 5. Nonobstructive right nephrolithiasis. 6. Calcifications and soft tissue density in the bilateral body wall as described. Findings may relate to prior infection, injection sites or trauma. Correlate with direct  clinical inspection. 7. Subacute rib fractures along the anterolateral left chest and the lateral and posterior left chest involving ribs 5 through 10. 8. Aortic atherosclerosis. Aortic Atherosclerosis (ICD10-I70.0). Electronically Signed   By: Zetta Bills M.D.   On: 03/01/2020 08:28    Assessment/Plan: 2 Days Post-Op   Principal Problem:   S/P TKR (total knee replacement) using cement, right Active Problems:   MS (multiple sclerosis) (HCC)   COPD exacerbation (HCC)   Depression with anxiety   Diabetes mellitus without complication (HCC)   HTN, goal below 140/90   Hyperlipidemia, unspecified   Hypothyroidism   GERD (gastroesophageal reflux disease)   Hematuria   Acute on chronic respiratory failure with hypoxia [facility])  Patient making appropriate progress with physical therapy.  She is doing well from an orthopedic standpoint.  I appreciate consults by urology, nephrology and the hospitalist service.  Urine cultures are negative to date.  Patient's chest x-ray from yesterday was negative for pneumonia.  CT hematuria scan results are noted.  Patient on Zithromax for possible bronchitis.  Continue Lovenox for DVT prophylaxis.  Continue strict blood sugar control with insulin sliding scale.  Continue with physical therapy.  Patient  awaiting skilled nursing facility placement, possibly at [facility].    Thornton Park , MD 03/01/2020, 6:47 PM

## 2020-03-01 NOTE — Progress Notes (Signed)
Physical Therapy Treatment Patient Details Name: Ashlee Fields MRN: [id number] DOB: 1956/08/29 Today's Date: 03/01/2020    History of Present Illness 64 y/o female s/p R total knee replacement.  Pt has MS with significant L sided involvement, h/o distant L TKA.    PT Comments    Pt continues to be highly motivated, did much better functionally this session than previous few, but still requiring considerable assist with all mobility.  +2 assist with all activity but actually able to sustain (with constant physical assist) standing for a few minutes with heavy cuing, UE reliance and at times direct assist to move/step with L LE.  Overall pt remains functionally very limited but is showing increased mobility, strength, had less pain and fear this session and is making some gains.    Follow Up Recommendations  CIR     Equipment Recommendations  (TBD at next venue of care)    Recommendations for Other Services       Precautions / Restrictions Precautions Precautions: Fall;Knee Precaution Booklet Issued: Yes (comment) Precaution Comments: issued HEP Required Braces or Orthoses: Knee Immobilizer - Right Knee Immobilizer - Right: (more appropriate to have off during mobility) Restrictions Weight Bearing Restrictions: Yes RLE Weight Bearing: Weight bearing as tolerated    Mobility  Bed Mobility Overal bed mobility: Needs Assistance Bed Mobility: Sit to Supine     Supine to sit: Mod assist Sit to supine: Mod assist;Max assist   General bed mobility comments: Pt was able to assist much better with getting to/from supine this date  Transfers Overall transfer level: Needs assistance Equipment used: Rolling walker (2 wheeled) Transfers: Sit to/from Stand Sit to Stand: Max assist;+2 physical assistance         General transfer comment: Pt was able to rise much better this afternoon though still requiring heavy assist but was able to maintain standing and keep hips  forward  Ambulation/Gait             General Gait Details: able to maintain standing and take a few side shuffle steps at EOB with great effort and heavy +2 assist.  She struggled with moving L LE needing direct assist multiple times to shift LE and to unweight b/l LEs during each step   Stairs             Wheelchair Mobility    Modified Rankin (Stroke Patients Only)       Balance Overall balance assessment: Needs assistance Sitting-balance support: No upper extremity supported;Feet supported Sitting balance-Leahy Scale: Fair Sitting balance - Comments: pt leaning to the R, but much more stable in sitting than AM session   Standing balance support: Bilateral upper extremity supported Standing balance-Leahy Scale: Poor Standing balance comment: pt was able to shift weight/hips forward over walker/UEs                            Cognition Arousal/Alertness: Awake/alert Behavior During Therapy: WFL for tasks assessed/performed Overall Cognitive Status: Within Functional Limits for tasks assessed                                        Exercises Total Joint Exercises Ankle Circles/Pumps: AROM;10 reps Quad Sets: Strengthening;15 reps Short Arc Quad: Strengthening;10 reps Heel Slides: Strengthening;10 reps(with resisted leg extensions) Hip ABduction/ADduction: Strengthening;15 reps Straight Leg Raises: AAROM;5 reps(unable to hold against gravity independently)  Long Arc Quad: AAROM;10 reps Knee Flexion: PROM;5 reps(gentle pulsed & sustained knee ext overpressure 10 reps) Goniometric ROM: still lacking a few degrees of extension, flexion >110    General Comments        Pertinent Vitals/Pain Pain Assessment: 0-10 Pain Score: 4  Pain Location: less pain this afternoon but still limiting    Home Living                      Prior Function            PT Goals (current goals can now be found in the care plan section)  Progress towards PT goals: Progressing toward goals    Frequency    BID      PT Plan Current plan remains appropriate    Co-evaluation              AM-PAC PT "6 Clicks" Mobility   Outcome Measure  Help needed turning from your back to your side while in a flat bed without using bedrails?: A Little Help needed moving from lying on your back to sitting on the side of a flat bed without using bedrails?: A Lot Help needed moving to and from a bed to a chair (including a wheelchair)?: A Lot Help needed standing up from a chair using your arms (e.g., wheelchair or bedside chair)?: A Lot Help needed to walk in hospital room?: Total Help needed climbing 3-5 steps with a railing? : Total 6 Click Score: 11    End of Session Equipment Utilized During Treatment: Gait belt Activity Tolerance: Patient limited by pain;Patient limited by fatigue Patient left: with bed alarm set;with call bell/phone within reach;with family/visitor present Nurse Communication: Mobility status PT Visit Diagnosis: Muscle weakness (generalized) (M62.81);Difficulty in walking, not elsewhere classified (R26.2);Pain Pain - Right/Left: Right Pain - part of body: Knee     Time: 0881-1031 PT Time Calculation (min) (ACUTE ONLY): 42 min  Charges:  $Therapeutic Exercise: 23-37 mins $Therapeutic Activity: 8-22 mins                     Kreg Shropshire, DPT 03/01/2020, 4:25 PM

## 2020-03-01 NOTE — Progress Notes (Signed)
Physical Therapy Treatment Patient Details Name: Ashlee Fields MRN: [id number] DOB: 1956/04/22 Today's Date: 03/01/2020    History of Present Illness 64 y/o female s/p R total knee replacement.  Pt has MS with significant L sided involvement, h/o distant L TKA.    PT Comments    Pt continues to be very motivated with PT but is frustrated that she has struggled since the initial POD0 effort.  Multiple attempts at standing this date with very poor tolerance, pt c/o pain in R knee (despite plenty of meds) as well fear of falling despite +2 assist, heavy cuing and help with set up.  Pt struggled to put weight through R and L (AFO donned) is limited with how much it can actually assist.  Pt with very good effort during sitting and supine exercises, needed heavy assist with mobility getting back to supine from sitting post standing efforts.     Follow Up Recommendations  CIR     Equipment Recommendations  (TBD at next venue of care)    Recommendations for Other Services       Precautions / Restrictions Precautions Precautions: Fall;Knee Precaution Booklet Issued: Yes (comment) Precaution Comments: issued HEP Required Braces or Orthoses: Knee Immobilizer - Right Knee Immobilizer - Right: (more appropriate to have off during mobility) Restrictions Weight Bearing Restrictions: Yes RLE Weight Bearing: Weight bearing as tolerated    Mobility  Bed Mobility Overal bed mobility: Needs Assistance Bed Mobility: Sit to Supine       Sit to supine: Max assist   General bed mobility comments: Pt sitting EOB on arrival, needed heavy assist to get back into bed and to scoot up in bed  Transfers Overall transfer level: Needs assistance Equipment used: Rolling walker (2 wheeled) Transfers: Sit to/from Stand Sit to Stand: Max assist;+2 physical assistance;Mod assist;From elevated surface         General transfer comment: Pt struggled to get weight over feet to be able to rise to  standing.  Could not raise hips of bed with lowered height and min assist, +2 needed on both actual standing attempts today.  Each time pt requests to sit down almost immedicately, unable to shift hips forward and c/o pain and fear as main limiters  Ambulation/Gait                 Stairs             Wheelchair Mobility    Modified Rankin (Stroke Patients Only)       Balance Overall balance assessment: Needs assistance Sitting-balance support: No upper extremity supported;Feet supported Sitting balance-Leahy Scale: Fair Sitting balance - Comments: pt leaning to the R, but able to keep from toppling over   Standing balance support: Bilateral upper extremity supported Standing balance-Leahy Scale: Zero Standing balance comment: heavy assist needed today to briefly keep hips off bed, poor tolerance                            Cognition Arousal/Alertness: Awake/alert Behavior During Therapy: WFL for tasks assessed/performed Overall Cognitive Status: Within Functional Limits for tasks assessed                                        Exercises Total Joint Exercises Ankle Circles/Pumps: AROM;10 reps Quad Sets: Strengthening;10 reps Short Arc Quad: Strengthening;10 reps Heel Slides: Strengthening;10 reps(with resisted  leg extensions) Hip ABduction/ADduction: Strengthening;10 reps Straight Leg Raises: (unable this date) Long Arc Quad: AAROM;10 reps Knee Flexion: PROM;5 reps Goniometric ROM: flex > 100, lacking ~4 degrees of extension    General Comments        Pertinent Vitals/Pain Pain Assessment: 0-10 Pain Score: 10-Worst pain ever Pain Location: Pt reports moderate pain on arrival, but c/o severe pain with all tasks despite plenty of pain meds    Home Living                      Prior Function            PT Goals (current goals can now be found in the care plan section) Progress towards PT goals: Progressing toward  goals    Frequency    BID      PT Plan Current plan remains appropriate    Co-evaluation              AM-PAC PT "6 Clicks" Mobility   Outcome Measure  Help needed turning from your back to your side while in a flat bed without using bedrails?: A Little Help needed moving from lying on your back to sitting on the side of a flat bed without using bedrails?: A Lot Help needed moving to and from a bed to a chair (including a wheelchair)?: A Lot Help needed standing up from a chair using your arms (e.g., wheelchair or bedside chair)?: A Lot Help needed to walk in hospital room?: Total Help needed climbing 3-5 steps with a railing? : Total 6 Click Score: 11    End of Session Equipment Utilized During Treatment: Gait belt Activity Tolerance: Patient limited by pain;Patient limited by fatigue Patient left: with bed alarm set;with call bell/phone within reach;with family/visitor present Nurse Communication: Mobility status PT Visit Diagnosis: Muscle weakness (generalized) (M62.81);Difficulty in walking, not elsewhere classified (R26.2);Pain Pain - Right/Left: Right Pain - part of body: Knee     Time: 1017-1101 PT Time Calculation (min) (ACUTE ONLY): 44 min  Charges:  $Therapeutic Exercise: 23-37 mins $Therapeutic Activity: 8-22 mins                     Kreg Shropshire, DPT 03/01/2020, 2:19 PM

## 2020-03-01 NOTE — Progress Notes (Signed)
Urology Inpatient Progress Note  Subjective: Ashlee Fields is a 64 y.o. female with PMH MS, DM 2, urinary incontinence, and microscopic and gross hematuria admitted on 02/28/2020 for scheduled total right knee arthroplasty with Dr. Mack Guise.  She developed gross hematuria postoperatively with Foley catheter in place.  Foley catheter subsequently discontinued.  We elected to proceed with CT urogram for evaluation of hematuria while inpatient due to plans for possibly prolonged rehab stay following discharge.  CT urogram completed this morning with findings of nonobstructing right nephrolithiasis with no overt causes of hematuria.  Urine culture ultimately resulted negative.  Patient has been voiding since Foley discontinuation.  She denies dysuria, urgency, frequency, and gross hematuria.  No acute concerns.  Anti-infectives: Anti-infectives (From admission, onward)   Start     Dose/Rate Route Frequency Ordered Stop   03/02/20 1000  azithromycin (ZITHROMAX) tablet 250 mg     250 mg Oral Daily 03/01/20 1600 03/06/20 0959   03/01/20 1700  azithromycin (ZITHROMAX) tablet 500 mg     500 mg Oral Daily 03/01/20 1600 03/01/20 1712   02/29/20 2200  doxycycline (VIBRA-TABS) tablet 100 mg  Status:  Discontinued     100 mg Oral Every 12 hours 02/29/20 1713 03/01/20 1600   02/28/20 1430  ceFAZolin (ANCEF) IVPB 1 g/50 mL premix     1 g 100 mL/hr over 30 Minutes Intravenous Every 6 hours 02/28/20 1335 02/28/20 2143   02/28/20 0737  ceFAZolin (ANCEF) 2-4 GM/100ML-% IVPB    Note to Pharmacy: Myles Lipps   : cabinet override      02/28/20 0737 02/28/20 0830   02/28/20 0631  clindamycin (CLEOCIN) 600 MG/50ML IVPB    Note to Pharmacy: Myles Lipps   : cabinet override      02/28/20 0631 02/28/20 0831   02/28/20 0615  ceFAZolin (ANCEF) IVPB 2g/100 mL premix     2 g 200 mL/hr over 30 Minutes Intravenous On call to O.R. 02/28/20 0612 02/28/20 0825   02/28/20 0030  clindamycin (CLEOCIN) IVPB 600 mg     600 mg 100 mL/hr over 30 Minutes Intravenous  Once 02/28/20 0020 02/28/20 0840      Current Facility-Administered Medications  Medication Dose Route Frequency Provider Last Rate Last Admin  . acetaminophen (TYLENOL) tablet 325-650 mg  325-650 mg Oral Q6H PRN Thornton Park, MD   650 mg at 03/01/20 1147  . albuterol (PROVENTIL) (2.5 MG/3ML) 0.083% nebulizer solution 2.5 mg  2.5 mg Nebulization Q4H PRN Ivor Costa, MD      . atorvastatin (LIPITOR) tablet 80 mg  80 mg Oral QHS Thornton Park, MD   80 mg at 02/29/20 2140  . [START ON 03/02/2020] azithromycin (ZITHROMAX) tablet 250 mg  250 mg Oral Daily Lorella Nimrod, MD      . baclofen (LIORESAL) tablet 40 mg  40 mg Oral QHS Thornton Park, MD   40 mg at 02/29/20 2141  . bisacodyl (DULCOLAX) suppository 10 mg  10 mg Rectal Daily PRN Thornton Park, MD      . cyanocobalamin ((VITAMIN B-12)) injection 1,000 mcg  1,000 mcg Intramuscular Q30 days Thornton Park, MD      . dextromethorphan-guaiFENesin [facility] DM) 30-600 MG per 12 hr tablet 1 tablet  1 tablet Oral BID Ivor Costa, MD   1 tablet at 03/01/20 [phone]  . diazepam (VALIUM) tablet 2 mg  2 mg Oral QID PRN Thornton Park, MD      . Dimethyl Fumarate CPDR 240 mg  240 mg Oral BID Mack Guise,  Lennette Bihari, MD   240 mg at 03/01/20 1039  . diphenhydrAMINE (BENADRYL) 12.5 MG/5ML elixir 12.5-25 mg  12.5-25 mg Oral Q4H PRN Thornton Park, MD      . docusate sodium (COLACE) capsule 100 mg  100 mg Oral BID Thornton Park, MD   100 mg at 03/01/20 8469  . enoxaparin (LOVENOX) injection 40 mg  40 mg Subcutaneous Q24H Thornton Park, MD   40 mg at 03/01/20 0930  . FLUoxetine (PROZAC) capsule 60 mg  60 mg Oral Daily Thornton Park, MD   60 mg at 03/01/20 0930  . gabapentin (NEURONTIN) capsule 300 mg  300 mg Oral TID Thornton Park, MD   300 mg at 03/01/20 1712  . hydrALAZINE (APRESOLINE) injection 5 mg  5 mg Intravenous Q2H PRN Ivor Costa, MD      . hydrochlorothiazide (HYDRODIURIL) tablet 25 mg  25  mg Oral Daily Thornton Park, MD   25 mg at 03/01/20 0933  . hydrochlorothiazide (MICROZIDE) capsule 12.5 mg  12.5 mg Oral Daily Thornton Park, MD   12.5 mg at 03/01/20 6295   And  . irbesartan (AVAPRO) tablet 300 mg  300 mg Oral Daily Thornton Park, MD   300 mg at 03/01/20 0932  . HYDROmorphone (DILAUDID) injection 0.5-1 mg  0.5-1 mg Intravenous Q4H PRN Thornton Park, MD      . insulin aspart (novoLOG) injection 0-15 Units  0-15 Units Subcutaneous TID WC Thornton Park, MD   3 Units at 03/01/20 1711  . insulin aspart (novoLOG) injection 0-5 Units  0-5 Units Subcutaneous QHS Thornton Park, MD      . ipratropium (ATROVENT) nebulizer solution 0.5 mg  0.5 mg Nebulization TID Ivor Costa, MD      . levothyroxine (SYNTHROID) tablet 50 mcg  50 mcg Oral Q0600 Thornton Park, MD   50 mcg at 03/01/20 0548  . LORazepam (ATIVAN) tablet 1 mg  1 mg Oral BID Thornton Park, MD   1 mg at 02/29/20 2152  . menthol-cetylpyridinium (CEPACOL) lozenge 3 mg  1 lozenge Oral PRN Thornton Park, MD       Or  . phenol (CHLORASEPTIC) mouth spray 1 spray  1 spray Mouth/Throat PRN Thornton Park, MD      . mirabegron ER [facility]) tablet 50 mg  50 mg Oral Daily Thornton Park, MD   50 mg at 03/01/20 0934  . multivitamin with minerals tablet 1 tablet  1 tablet Oral QHS Thornton Park, MD   1 tablet at 02/29/20 2153  . ondansetron (ZOFRAN) tablet 4 mg  4 mg Oral Q6H PRN Thornton Park, MD       Or  . ondansetron [facility]) injection 4 mg  4 mg Intravenous Q6H PRN Thornton Park, MD   4 mg at 02/29/20 0736  . ondansetron (ZOFRAN) injection 4 mg  4 mg Intravenous Q8H PRN Ivor Costa, MD      . oxyCODONE (Oxy IR/ROXICODONE) immediate release tablet 10-15 mg  10-15 mg Oral Q4H PRN Thornton Park, MD      . oxyCODONE (Oxy IR/ROXICODONE) immediate release tablet 5-10 mg  5-10 mg Oral Q4H PRN Thornton Park, MD   5 mg at 03/01/20 1038  . pantoprazole (PROTONIX) EC tablet 40 mg  40 mg Oral Daily  Thornton Park, MD   40 mg at 03/01/20 2841  . predniSONE (DELTASONE) tablet 10 mg  10 mg Oral Q breakfast Thornton Park, MD   10 mg at 03/01/20 0935  . senna-docusate (Senokot-S) tablet 1 tablet  1 tablet Oral QHS  PRN Thornton Park, MD      . sodium phosphate (FLEET) 7-19 GM/118ML enema 1 enema  1 enema Rectal Once PRN Thornton Park, MD      . traMADol [facility]at[facility] Bourbon) tablet 50 mg  50 mg Oral Q6H Thornton Park, MD   50 mg at 03/01/20 1712   Objective: Vital signs in last 24 hours: Temp:  [98.2 F (36.8 C)-99.6 F (37.6 C)] 98.8 F (37.1 C) (04/08 1544) Pulse Rate:  [101-112] 101 (04/08 1544) Resp:  [18-20] 18 (04/08 1544) BP: (122-150)/(58-80) 148/78 (04/08 1544) SpO2:  [91 %-98 %] 98 % (04/08 1544)  Intake/Output from previous day: 04/07 0701 - 04/08 0700 In: -  Out: 1885 [Urine:1885] Intake/Output this shift: Total I/O In: -  Out: 600 [Urine:600]  Physical Exam Vitals and nursing note reviewed.  Constitutional:      General: She is not in acute distress.    Appearance: She is not ill-appearing, toxic-appearing or diaphoretic.  HENT:     Head: Normocephalic and atraumatic.  Pulmonary:     Effort: Pulmonary effort is normal. No respiratory distress.  Skin:    General: Skin is warm and dry.  Neurological:     Mental Status: She is alert and oriented to person, place, and time.  Psychiatric:        Mood and Affect: Mood normal.        Behavior: Behavior normal.    Lab Results:  Recent Labs    02/29/20 0539 03/01/20 0417  WBC 12.7* 11.6*  HGB 10.0* 10.0*  HCT 30.6* 31.3*  PLT 343 333   BMET Recent Labs    02/29/20 0539  NA 141  K 3.9  CL 103  CO2 28  GLUCOSE 136*  BUN 13  CREATININE 0.63  CALCIUM 8.5*   PT/INR Recent Labs    02/28/20 0631  LABPROT 11.9  INR 0.9   Studies/Results: CT HEMATURIA WORKUP  Result Date: 03/01/2020 CLINICAL DATA:  Gross hematuria. EXAM: CT ABDOMEN AND PELVIS WITHOUT AND WITH CONTRAST TECHNIQUE: Multidetector CT  imaging of the abdomen and pelvis was performed following the standard protocol before and following the bolus administration of intravenous contrast. CONTRAST:  146m OMNIPAQUE IOHEXOL 300 MG/ML  SOLN COMPARISON:  Renal sonogram of 02/24/2020. FINDINGS: Lower chest: Basilar atelectasis without effusion or consolidation. Hepatobiliary: Liver is normal without focal lesion. Portal vein is patent. The gallbladder without pericholecystic stranding. Pancreas: No pancreatic abnormality, no ductal dilation, lesion or peripancreatic inflammation. Spleen: Spleen is normal size without focal lesion. Adrenals/Urinary Tract: Adrenal glands are normal. Tiny nonobstructing calculus in the upper pole the right kidney (image 31, series 2) approximately 2 mm. Similarly and interpolar calculus on the right also 2 mm. No signs of ureteral calculus. Symmetric renal enhancement. Small amount of gas in the anterior urinary bladder without signs of bladder wall thickening. Delayed images of the urinary tract show no evidence of filling defect or signs of stricture. Ureters and urinary bladder are incompletely opacified. Stomach/Bowel: Gastric distension is moderate. Small bowel loops are fluid-filled without discrete transition point with mild dilation. Abundant stool both liquid and formed is present throughout the colon. The appendix is normal. No signs of pneumatosis or free air. No pericolonic or perienteric stranding. Vascular/Lymphatic: Calcified and noncalcified plaque throughout the abdominal aorta, no signs of aneurysm. No adenopathy. No pelvic lymphadenopathy. Reproductive: Uterus and adnexa without signs of acute process by CT. Other: Calcifications in the body wall and mild stranding in the flank, of uncertain significance potentially related to  injection or prior trauma. These areas are along the posterolateral flank and or at the level of the iliac crest with thickening of overlying skin and fat stranding and soft tissue  density with deeper areas in the subcutaneous fat containing calcification. Musculoskeletal: The right lower extremity is imaged to a greater extent than the left. There is stranding about the rectus femoris muscle. The contralateral side is not imaged for comparison based on scan parameters. Subacute rib fractures along the anterolateral left chest and the lateral and posterior left chest involving ribs 5 through 10. Spinal degenerative changes. IMPRESSION: 1. Nonobstructing intrarenal calculi on the right otherwise no findings to explain hematuria. Gross hematuria would not necessarily be explained by these findings. 2. Small amount of gas in the urinary bladder, correlate with any recent urinary bladder instrumentation. 3. Global mild distension of bowel compatible with ileus. Attention on follow-up. 4. Right lower extremity is imaged to a greater extent than the left. Mild stranding about the rectus femoris muscle likely related to recent knee replacement. Correlate with any increased pain or swelling with dedicated imaging as warranted 5. Nonobstructive right nephrolithiasis. 6. Calcifications and soft tissue density in the bilateral body wall as described. Findings may relate to prior infection, injection sites or trauma. Correlate with direct clinical inspection. 7. Subacute rib fractures along the anterolateral left chest and the lateral and posterior left chest involving ribs 5 through 10. 8. Aortic atherosclerosis. Aortic Atherosclerosis (ICD10-I70.0). Electronically Signed   By: Zetta Bills M.D.   On: 03/01/2020 08:28   I personally reviewed the imaging above and note the presence of nonobstructing right nephrolithiasis.  Assessment & Plan: 64 year old female with PMH MS, DM 2, hematuria, urinary incontinence, and recent episode of AKI who developed gross hematuria in the setting of perioperative Foley catheterization s/p total right knee arthroplasty.  Gross hematuria resolved.  Foley catheter  discontinued.  Urine culture negative, antibiotics not indicated.  CT urogram without significant findings in the upper urinary tract to explain hematuria.  I had a lengthy conversation with the patient and her husband today that the work-up for hematuria is not complete until she undergoes cystoscopy to evaluate her lower urinary tract including bladder and urethra.  We will plan for this in approximately 1 month when she completes outpatient rehab.  I will work to get this scheduled.  She expressed understanding.  Debroah Loop, PA-C 03/01/2020

## 2020-03-01 NOTE — Progress Notes (Signed)
OT Cancellation Note  Patient Details Name: Ashlee Fields MRN: [id number] DOB: 1956/07/17   Cancelled Treatment:    Reason Eval/Treat Not Completed: Other (comment);Fatigue/lethargy limiting ability to participate   Patient supine in bed and barely keeping eyes open as author entered room.  Patient declining therapy at this time secondary to lethargy.  Will follow up as available and appropriate.    Oren Binet 03/01/2020, 4:31 PM

## 2020-03-02 LAB — GLUCOSE, CAPILLARY
Glucose-Capillary: 100 mg/dL — ABNORMAL HIGH (ref 70–99)
Glucose-Capillary: 116 mg/dL — ABNORMAL HIGH (ref 70–99)
Glucose-Capillary: 117 mg/dL — ABNORMAL HIGH (ref 70–99)
Glucose-Capillary: 110 mg/dL — ABNORMAL HIGH (ref 70–99)

## 2020-03-02 MED ORDER — IPRATROPIUM BROMIDE 0.02 % IN SOLN: 0.5000 mg | Freq: Four times a day (QID) | RESPIRATORY_TRACT | Status: DC | PRN

## 2020-03-02 NOTE — Progress Notes (Signed)
Subjective:  POD #3 s/p right total knee arthroplasty.   Patient reports right knee pain as mild to moderate.  Patient's husband is at the bedside.  Patient has swelling and ecchymosis of the right foot and ankle.  Patient is more awake today compared to last night when she was experiencing significant drowsiness.  Objective:   VITALS:   Vitals:   03/02/20 0733 03/02/20 0758 03/02/20 0951 03/02/20 1314  BP:  123/66 130/60   Pulse:  (!) 102 94   Resp:  17    Temp:  98 F (36.7 C)    TempSrc:  Oral    SpO2: 90% 99%  92%  Weight:      Height:        PHYSICAL EXAM: Right lower extremity: Patient's dressing is clean dry and intact.  Patient's right foot and ankle are moderately edematous with ecchymosis.  Patient has intact sensation light touch.  She can flex and extend her toes and has palpable pedal pulses.  She has no significant tenderness over the ankle joint or foot to palpation.   LABS  Results for orders placed or performed during the hospital encounter of 02/28/20 (from the past 24 hour(s))  Glucose, capillary     Status: Abnormal   Collection Time: 03/01/20  4:54 PM  Result Value Ref Range   Glucose-Capillary 159 (H) 70 - 99 mg/dL   Comment 1 Notify RN    Comment 2 Document in Chart   Glucose, capillary     Status: Abnormal   Collection Time: 03/01/20  9:10 PM  Result Value Ref Range   Glucose-Capillary 109 (H) 70 - 99 mg/dL   Comment 1 Notify RN   Glucose, capillary     Status: Abnormal   Collection Time: 03/02/20  8:00 AM  Result Value Ref Range   Glucose-Capillary 100 (H) 70 - 99 mg/dL   Comment 1 Notify RN   Glucose, capillary     Status: Abnormal   Collection Time: 03/02/20 12:55 PM  Result Value Ref Range   Glucose-Capillary 116 (H) 70 - 99 mg/dL   Comment 1 Notify RN     DG CHEST PORT 1 VIEW  Result Date: 02/29/2020 CLINICAL DATA:  Productive cough EXAM: PORTABLE CHEST 1 VIEW COMPARISON:  07/20/2016 FINDINGS: Mild cardiomegaly. Pulmonary vascular  prominence. The visualized skeletal structures are unremarkable. IMPRESSION: Mild cardiomegaly with pulmonary vascular prominence but without overt pulmonary edema. No focal airspace opacity. Electronically Signed   By: Eddie Candle M.D.   On: 02/29/2020 16:54   CT HEMATURIA WORKUP  Result Date: 03/01/2020 CLINICAL DATA:  Gross hematuria. EXAM: CT ABDOMEN AND PELVIS WITHOUT AND WITH CONTRAST TECHNIQUE: Multidetector CT imaging of the abdomen and pelvis was performed following the standard protocol before and following the bolus administration of intravenous contrast. CONTRAST:  130m OMNIPAQUE IOHEXOL 300 MG/ML  SOLN COMPARISON:  Renal sonogram of 02/24/2020. FINDINGS: Lower chest: Basilar atelectasis without effusion or consolidation. Hepatobiliary: Liver is normal without focal lesion. Portal vein is patent. The gallbladder without pericholecystic stranding. Pancreas: No pancreatic abnormality, no ductal dilation, lesion or peripancreatic inflammation. Spleen: Spleen is normal size without focal lesion. Adrenals/Urinary Tract: Adrenal glands are normal. Tiny nonobstructing calculus in the upper pole the right kidney (image 31, series 2) approximately 2 mm. Similarly and interpolar calculus on the right also 2 mm. No signs of ureteral calculus. Symmetric renal enhancement. Small amount of gas in the anterior urinary bladder without signs of bladder wall thickening. Delayed images of the urinary  tract show no evidence of filling defect or signs of stricture. Ureters and urinary bladder are incompletely opacified. Stomach/Bowel: Gastric distension is moderate. Small bowel loops are fluid-filled without discrete transition point with mild dilation. Abundant stool both liquid and formed is present throughout the colon. The appendix is normal. No signs of pneumatosis or free air. No pericolonic or perienteric stranding. Vascular/Lymphatic: Calcified and noncalcified plaque throughout the abdominal aorta, no signs of  aneurysm. No adenopathy. No pelvic lymphadenopathy. Reproductive: Uterus and adnexa without signs of acute process by CT. Other: Calcifications in the body wall and mild stranding in the flank, of uncertain significance potentially related to injection or prior trauma. These areas are along the posterolateral flank and or at the level of the iliac crest with thickening of overlying skin and fat stranding and soft tissue density with deeper areas in the subcutaneous fat containing calcification. Musculoskeletal: The right lower extremity is imaged to a greater extent than the left. There is stranding about the rectus femoris muscle. The contralateral side is not imaged for comparison based on scan parameters. Subacute rib fractures along the anterolateral left chest and the lateral and posterior left chest involving ribs 5 through 10. Spinal degenerative changes. IMPRESSION: 1. Nonobstructing intrarenal calculi on the right otherwise no findings to explain hematuria. Gross hematuria would not necessarily be explained by these findings. 2. Small amount of gas in the urinary bladder, correlate with any recent urinary bladder instrumentation. 3. Global mild distension of bowel compatible with ileus. Attention on follow-up. 4. Right lower extremity is imaged to a greater extent than the left. Mild stranding about the rectus femoris muscle likely related to recent knee replacement. Correlate with any increased pain or swelling with dedicated imaging as warranted 5. Nonobstructive right nephrolithiasis. 6. Calcifications and soft tissue density in the bilateral body wall as described. Findings may relate to prior infection, injection sites or trauma. Correlate with direct clinical inspection. 7. Subacute rib fractures along the anterolateral left chest and the lateral and posterior left chest involving ribs 5 through 10. 8. Aortic atherosclerosis. Aortic Atherosclerosis (ICD10-I70.0). Electronically Signed   By: Zetta Bills M.D.   On: 03/01/2020 08:28    Assessment/Plan: 3 Days Post-Op   Principal Problem:   S/P TKR (total knee replacement) using cement, right Active Problems:   MS (multiple sclerosis) (HCC)   COPD exacerbation (HCC)   Depression with anxiety   Diabetes mellitus without complication (HCC)   HTN, goal below 140/90   Hyperlipidemia, unspecified   Hypothyroidism   GERD (gastroesophageal reflux disease)   Hematuria   Acute on chronic respiratory failure with hypoxia [facility])  Patient will continue physical therapy.  She may remove her knee immobilizer for therapy and for transfers from bed to chair.  She may have the knee immobilizer off when she is in a chair.  Patient would like to take a diet medication she takes at home.  I will give the order to allow her to do so.  Patient may weight-bear as tolerated on the right lower extremity.  She will continue Lovenox for DVT prophylaxis.  Patient will set up further work-up for her hematuria with urology after discharge from the hospital.  Patient's breathing has improved.  She is currently taking a Z-Pak for possible bronchitis.  [facility], urology and the hospitalist service following the patient in consultation.    Thornton Park , MD 03/02/2020, 1:49 PM

## 2020-03-02 NOTE — Progress Notes (Signed)
Patient is POD3 s/p right total knee arthroplasty. During shift assessment noticed patients R foot extremely swollen with bruising and paleness. +3 edema. Has full sensation with a pulse of +2.

## 2020-03-02 NOTE — Telephone Encounter (Signed)
Patient notified and scheduled 

## 2020-03-02 NOTE — Care Management Important Message (Signed)
Important Message  Patient Details  Name: Ashlee Fields MRN: [id number] Date of Birth: 1956/04/11   Medicare Important Message Given:  Yes     Juliann Pulse A Merdis Snodgrass 03/02/2020, 11:25 AM

## 2020-03-02 NOTE — Progress Notes (Signed)
PROGRESS NOTE    Ashlee Fields  MGN:[id number] DOB: 01/20/1956 DOA: 02/28/2020 PCP: [name], MD   Brief Narrative:  Ashlee Fields is an 64 y.o. female with a past medical history of hypertension, hyperlipidemia, diabetes mellitus, COPD, GERD, hypothyroidism, depression with anxiety, OSA on CPAP, multiple sclerosis, pulmonary hypertension, who is admitted by orthopedic surgeon, Dr. Mack Guise for left knee surgery.  Patient is day 1 s/p right total knee arthroplasty today. We are asked to consult due to cough and SOB.  Subjective: Patient was complaining of right foot edema and ecchymosis.  Her shortness of breath and cough has been improved.  Assessment & Plan:   Principal Problem:   S/P TKR (total knee replacement) using cement, right Active Problems:   MS (multiple sclerosis) (HCC)   COPD exacerbation (HCC)   Depression with anxiety   Diabetes mellitus without complication (HCC)   HTN, goal below 140/90   Hyperlipidemia, unspecified   Hypothyroidism   GERD (gastroesophageal reflux disease)   Hematuria   Acute on chronic respiratory failure with hypoxia (HCC)  Acute on chronic respiratory failure with hypoxia. No  Prior diagnosis of COPD.  Patient is not a smoker.  She was saturating well on room air today.  She did get bronchitis quite easily and asking to switch her antibiotics to azithromycin as it always worked better for her.  Doxycycline causes diarrhea.  Doxycycline was discontinued and she was started on Z-Pak yesterday. -Continue supportive care with Mucinex and incentive spirometry.  S/P TKR (total knee replacement) using cement, right: day 1 s/p of surgery -pain management per orthopedic surgery team.  Depression and anxiety: Stable, no suicidal or homicidal ideations. -Continue home medications  Diabetes mellitus without complication ([place]): Most recent A1c 7.5 on 01/30/20, poorly controled. Patient is taking Metformin semaglutide at home -SSI  HTN:   -Continue home medications: HCTZ and irbesartan -hydralazine prn  Hyperlipidemia, unspecified -lipitor  Hypothyroidism -Synthroid  GERD (gastroesophageal reflux disease) -Protonix  MS:  -continue home meds: Dimethyl fumarate (Tecfidera)  OSA: -on CPAP  Objective: Vitals:   03/02/20 0733 03/02/20 0758 03/02/20 0951 03/02/20 1314  BP:  123/66 130/60   Pulse:  (!) 102 94   Resp:  17    Temp:  98 F (36.7 C)    TempSrc:  Oral    SpO2: 90% 99%  92%  Weight:      Height:        Intake/Output Summary (Last 24 hours) at 03/02/2020 1620 Last data filed at 03/02/2020 1029 Gross per 24 hour  Intake 340 ml  Output 1150 ml  Net -810 ml   Filed Weights   02/28/20 0659  Weight: 86.2 kg    Examination:  General exam: Appears calm and comfortable  Respiratory system: Clear to auscultation. Respiratory effort normal. Cardiovascular system: S1 & S2 heard, RRR. No JVD, murmurs, rubs, gallops or clicks. Gastrointestinal system: Soft, nontender, nondistended, bowel sounds positive. Central nervous system: Alert and oriented. No focal neurological deficits.Symmetric 5 x 5 power. Extremities: Right foot edema, no cyanosis, pulses intact and symmetrical. Skin: No rashes, lesions or ulcers Psychiatry: Judgement and insight appear normal. Mood & affect appropriate.    DVT prophylaxis: Lovenox Code Status: Full Family Communication: Husband was updated at bedside. Disposition Plan: Orthopedic is primary.  Consultants:   [facility] hospitalist  Procedures:  Antimicrobials:  Azithromycin  Data Reviewed: I have personally reviewed following labs and imaging studies  CBC: Recent Labs  Lab 02/29/20 0539 03/01/20 0417  WBC  12.7* 11.6*  HGB 10.0* 10.0*  HCT 30.6* 31.3*  MCV 90.8 93.2  PLT 343 277   Basic Metabolic Panel: Recent Labs  Lab 02/29/20 0539  NA 141  K 3.9  CL 103  CO2 28  GLUCOSE 136*  BUN 13  CREATININE 0.63  CALCIUM 8.5*   GFR: Estimated  Creatinine Clearance: 72.9 mL/min (by C-G formula based on SCr of 0.63 mg/dL). Liver Function Tests: No results for input(s): AST, ALT, ALKPHOS, BILITOT, PROT, ALBUMIN in the last 168 hours. No results for input(s): LIPASE, AMYLASE in the last 168 hours. No results for input(s): AMMONIA in the last 168 hours. Coagulation Profile: Recent Labs  Lab 02/28/20 0631  INR 0.9   Cardiac Enzymes: No results for input(s): CKTOTAL, CKMB, CKMBINDEX, TROPONINI in the last 168 hours. BNP (last 3 results) No results for input(s): PROBNP in the last 8760 hours. HbA1C: No results for input(s): HGBA1C in the last 72 hours. CBG: Recent Labs  Lab 03/01/20 1201 03/01/20 1654 03/01/20 2110 03/02/20 0800 03/02/20 1255  GLUCAP 116* 159* 109* 100* 116*   Lipid Profile: No results for input(s): CHOL, HDL, LDLCALC, TRIG, CHOLHDL, LDLDIRECT in the last 72 hours. Thyroid Function Tests: No results for input(s): TSH, T4TOTAL, FREET4, T3FREE, THYROIDAB in the last 72 hours. Anemia Panel: No results for input(s): VITAMINB12, FOLATE, FERRITIN, TIBC, IRON, RETICCTPCT in the last 72 hours. Sepsis Labs: No results for input(s): PROCALCITON, LATICACIDVEN in the last 168 hours.  Recent Results (from the past 240 hour(s))  Surgical pcr screen     Status: None   Collection Time: 02/24/20 10:46 AM   Specimen: Nasal Mucosa; Nasal Swab  Result Value Ref Range Status   MRSA, PCR NEGATIVE NEGATIVE Final   Staphylococcus aureus NEGATIVE NEGATIVE Final    Comment: (NOTE) The Xpert SA Assay (FDA approved for NASAL specimens in patients 74 years of age and older), is one component of a comprehensive surveillance program. It is not intended to diagnose infection nor to guide or monitor treatment. Performed at [facility], [place], [place] [address]   SARS CORONAVIRUS 2 (TAT 6-24 HRS) Nasopharyngeal Nasopharyngeal Swab     Status: None   Collection Time: 02/27/20  9:39 AM   Specimen:  Nasopharyngeal Swab  Result Value Ref Range Status   SARS Coronavirus 2 NEGATIVE NEGATIVE Final    Comment: (NOTE) SARS-CoV-2 target nucleic acids are NOT DETECTED. The SARS-CoV-2 RNA is generally detectable in upper and lower respiratory specimens during the acute phase of infection. Negative results do not preclude SARS-CoV-2 infection, do not rule out co-infections with other pathogens, and should not be used as the sole basis for treatment or other patient management decisions. Negative results must be combined with clinical observations, patient history, and epidemiological information. The expected result is Negative. Fact Sheet for Patients: SugarRoll.be Fact Sheet for Healthcare Providers: https://www.woods-mathews.com/ This test is not yet approved or cleared by the [place] FDA and  has been authorized for detection and/or diagnosis of SARS-CoV-2 by FDA under an Emergency Use Authorization (EUA). This EUA will remain  in effect (meaning this test can be used) for the duration of the COVID-19 declaration under Section 56 4(b)(1) of the Act, 21 U.S.C. section 360bbb-3(b)(1), unless the authorization is terminated or revoked sooner. Performed at [facility], [place] [address] [place], [place] [address]   Culture, Urine     Status: None   Collection Time: 02/29/20  7:30 AM   Specimen: Urine, Catheterized  Result Value Ref Range Status   Specimen Description   Final    URINE, CATHETERIZED Performed at [facility], [address], [place], [place] [address]    Special Requests   Final    NONE Performed at [facility], [address], [place], [place] [address]    Culture   Final    NO GROWTH Performed at [facility], [place] [address] [place], [place] [address]    Report Status 03/01/2020 FINAL  Final     Radiology Studies: DG CHEST PORT 1 VIEW  Result Date: 02/29/2020 CLINICAL DATA:   Productive cough EXAM: PORTABLE CHEST 1 VIEW COMPARISON:  07/20/2016 FINDINGS: Mild cardiomegaly. Pulmonary vascular prominence. The visualized skeletal structures are unremarkable. IMPRESSION: Mild cardiomegaly with pulmonary vascular prominence but without overt pulmonary edema. No focal airspace opacity. Electronically Signed   By: Eddie Candle M.D.   On: 02/29/2020 16:54   CT HEMATURIA WORKUP  Result Date: 03/01/2020 CLINICAL DATA:  Gross hematuria. EXAM: CT ABDOMEN AND PELVIS WITHOUT AND WITH CONTRAST TECHNIQUE: Multidetector CT imaging of the abdomen and pelvis was performed following the standard protocol before and following the bolus administration of intravenous contrast. CONTRAST:  164m OMNIPAQUE IOHEXOL 300 MG/ML  SOLN COMPARISON:  Renal sonogram of 02/24/2020. FINDINGS: Lower chest: Basilar atelectasis without effusion or consolidation. Hepatobiliary: Liver is normal without focal lesion. Portal vein is patent. The gallbladder without pericholecystic stranding. Pancreas: No pancreatic abnormality, no ductal dilation, lesion or peripancreatic inflammation. Spleen: Spleen is normal size without focal lesion. Adrenals/Urinary Tract: Adrenal glands are normal. Tiny nonobstructing calculus in the upper pole the right kidney (image 31, series 2) approximately 2 mm. Similarly and interpolar calculus on the right also 2 mm. No signs of ureteral calculus. Symmetric renal enhancement. Small amount of gas in the anterior urinary bladder without signs of bladder wall thickening. Delayed images of the urinary tract show no evidence of filling defect or signs of stricture. Ureters and urinary bladder are incompletely opacified. Stomach/Bowel: Gastric distension is moderate. Small bowel loops are fluid-filled without discrete transition point with mild dilation. Abundant stool both liquid and formed is present throughout the colon. The appendix is normal. No signs of pneumatosis or free air. No pericolonic or  perienteric stranding. Vascular/Lymphatic: Calcified and noncalcified plaque throughout the abdominal aorta, no signs of aneurysm. No adenopathy. No pelvic lymphadenopathy. Reproductive: Uterus and adnexa without signs of acute process by CT. Other: Calcifications in the body wall and mild stranding in the flank, of uncertain significance potentially related to injection or prior trauma. These areas are along the posterolateral flank and or at the level of the iliac crest with thickening of overlying skin and fat stranding and soft tissue density with deeper areas in the subcutaneous fat containing calcification. Musculoskeletal: The right lower extremity is imaged to a greater extent than the left. There is stranding about the rectus femoris muscle. The contralateral side is not imaged for comparison based on scan parameters. Subacute rib fractures along the anterolateral left chest and the lateral and posterior left chest involving ribs 5 through 10. Spinal degenerative changes. IMPRESSION: 1. Nonobstructing intrarenal calculi on the right otherwise no findings to explain hematuria. Gross hematuria would not necessarily be explained by these findings. 2. Small amount of gas in the urinary bladder, correlate with any recent urinary bladder instrumentation. 3. Global mild distension of bowel compatible with ileus. Attention on follow-up. 4. Right lower extremity is imaged to a greater extent than the left. Mild stranding  about the rectus femoris muscle likely related to recent knee replacement. Correlate with any increased pain or swelling with dedicated imaging as warranted 5. Nonobstructive right nephrolithiasis. 6. Calcifications and soft tissue density in the bilateral body wall as described. Findings may relate to prior infection, injection sites or trauma. Correlate with direct clinical inspection. 7. Subacute rib fractures along the anterolateral left chest and the lateral and posterior left chest involving  ribs 5 through 10. 8. Aortic atherosclerosis. Aortic Atherosclerosis (ICD10-I70.0). Electronically Signed   By: Zetta Bills M.D.   On: 03/01/2020 08:28    Scheduled Meds: . atorvastatin  80 mg Oral QHS  . azithromycin  250 mg Oral Daily  . baclofen  40 mg Oral QHS  . cyanocobalamin  1,000 mcg Intramuscular Q30 days  . dextromethorphan-guaiFENesin  1 tablet Oral BID  . Dimethyl Fumarate  240 mg Oral BID  . docusate sodium  100 mg Oral BID  . enoxaparin (LOVENOX) injection  40 mg Subcutaneous Q24H  . FLUoxetine  60 mg Oral Daily  . gabapentin  300 mg Oral TID  . hydrochlorothiazide  25 mg Oral Daily  . hydrochlorothiazide  12.5 mg Oral Daily   And  . irbesartan  300 mg Oral Daily  . insulin aspart  0-15 Units Subcutaneous TID AC & HS  . insulin aspart  0-5 Units Subcutaneous QHS  . ipratropium  0.5 mg Nebulization TID  . levothyroxine  50 mcg Oral Q0600  . LORazepam  1 mg Oral BID  . mirabegron ER  50 mg Oral Daily  . multivitamin with minerals  1 tablet Oral QHS  . pantoprazole  40 mg Oral Daily  . predniSONE  10 mg Oral Q breakfast  . traMADol  50 mg Oral Q6H   Continuous Infusions:   LOS: 3 days   Time spent: 30 minutes  Lorella Nimrod, MD [facility]  If 7PM-7AM, please contact night-coverage [email or web address]  03/02/2020, 4:20 PM   This record has been created using Systems analyst. Errors have been sought and corrected,but may not always be located. Such creation errors do not reflect on the standard of care.

## 2020-03-02 NOTE — Progress Notes (Signed)
Inpatient Rehabilitation-Admissions Coordinator   I have not yet heard from insurance regarding a determination for CIR. Anticipate I will not hear til Monday at the earliest. [facility] will follow up once there has been a determination.   Raechel Ache, OTR/L  Rehab Admissions Coordinator  [phone] 03/02/2020 1:49 PM

## 2020-03-02 NOTE — Progress Notes (Signed)
Occupational Therapy Treatment Patient Details Name: Ashlee Fields MRN: [id number] DOB: 07/26/1956 Today's Date: 03/02/2020    History of present illness 64 y/o female s/p R total knee replacement.  Pt has MS with significant L sided involvement, h/o distant L TKA.   OT comments  Patient received supine in bed, endorsing lethargy.  Patient states she "really did a lot with PT".  Patient declines OOB activity or bed level activity.  Provided extensive education on importance of overall participation with OT and PT to prepare for CIR at discharge.  Patient verbalizes understanding.  Provided further education of importance of OOB activity to improve overall health and wellbeing.  Patient verbalizes understanding.  Continuing to recommend CIR, but will continue to monitor progress and update appropriately for tolerance and ability to participate.   Follow Up Recommendations  CIR    Equipment Recommendations  None recommended by OT    Recommendations for Other Services      Precautions / Restrictions Precautions Precautions: Fall;Knee Required Braces or Orthoses: Knee Immobilizer - Right(May doff for therapy, transfres and when seated in recliner) Restrictions Weight Bearing Restrictions: Yes RLE Weight Bearing: Weight bearing as tolerated       Mobility Bed Mobility Overal bed mobility: Needs Assistance    General bed mobility comments: Patient declined to perform bed mobility.  Transfers Overall transfer level: Needs assistance          General transfer comment: Patient declined OOB activity.    Balance                            ADL either performed or assessed with clinical judgement   ADL Overall ADL's : Needs assistance/impaired                                       General ADL Comments: Continues to require extensive assistance to perform BADLs, functional transfers secondary to lethargy, pain and medication side effects.      Vision Patient Visual Report: No change from baseline     Perception     Praxis      Cognition Arousal/Alertness: Lethargic Behavior During Therapy: WFL for tasks assessed/performed Overall Cognitive Status: Within Functional Limits for tasks assessed                                          Exercises  Other Exercises Other Exercises: Provided extensive education on overall importance of participation in both therapies (OT/PT). Other Exercises: Provided education on CIR and rigorous therapy.   Shoulder Instructions       General Comments Patient demonstrating lethargy stating she worked really hard with PT this date.    Pertinent Vitals/ Pain       Pain Assessment: No/denies pain(States no pain when in bed.)   Home Living                                          Prior Functioning/Environment              Frequency  Min 2X/week        Progress Toward Goals  OT Goals(current goals can now be found in  the care plan section)  Progress towards OT goals: OT to reassess next treatment     Plan Discharge plan remains appropriate;Frequency needs to be updated    Co-evaluation                 AM-PAC OT "6 Clicks" Daily Activity     Outcome Measure                    End of Session    OT Visit Diagnosis: Muscle weakness (generalized) (M62.81);Other (comment)(R TKA)   Activity Tolerance Patient limited by lethargy   Patient Left in bed;with call bell/phone within reach;with bed alarm set;with SCD's reapplied;Other (comment);with family/visitor present   Nurse Communication          Time: [id number] OT Time Calculation (min): 8 min  Charges: OT General Charges $OT Visit: 1 Visit OT Treatments $Therapeutic Activity: 8-22 mins  Baldomero Lamy, MS, OTR/L 03/02/20, 3:20 PM

## 2020-03-02 NOTE — Progress Notes (Signed)
PT Cancellation Note  Patient Details Name: Ashlee Fields MRN: [id number] DOB: [birth year]   Cancelled Treatment:    Reason Eval/Treat Not Completed: Patient declined, no reason specified Pt reports that she has not gotten 2 of her normal home meds that "are for my energy."  She reports being too tired, too weak and too "loopy" to even do bed exercises (PT and husband encourage this to no avail).  Nursing working on getting these meds and will hold AM and plan to return for PM session, pt agreeable.    Kreg Shropshire, DPT 03/02/2020, 11:12 AM

## 2020-03-03 LAB — GLUCOSE, CAPILLARY
Glucose-Capillary: 105 mg/dL — ABNORMAL HIGH (ref 70–99)
Glucose-Capillary: 173 mg/dL — ABNORMAL HIGH (ref 70–99)
Glucose-Capillary: 152 mg/dL — ABNORMAL HIGH (ref 70–99)
Glucose-Capillary: 123 mg/dL — ABNORMAL HIGH (ref 70–99)

## 2020-03-03 MED ORDER — PHENDIMETRAZINE TARTRATE 35 MG PO TABS
105.0000 mg | ORAL_TABLET | Freq: Every day | ORAL | Status: DC
  Administered 2020-03-04 – 2020-03-06 (×2): 105 mg via ORAL
  Filled 2020-03-03 (×3): qty 3

## 2020-03-03 MED ORDER — PHENDIMETRAZINE TARTRATE 35 MG PO TABS
35.0000 mg | ORAL_TABLET | Freq: Three times a day (TID) | ORAL | Status: DC
  Administered 2020-03-03: 11:00:00 35 mg via ORAL
  Filled 2020-03-03 (×2): qty 1

## 2020-03-03 MED ORDER — NON FORMULARY: 35.0000 mg | Freq: Three times a day (TID) | Status: DC

## 2020-03-03 NOTE — Progress Notes (Addendum)
Physical Therapy Treatment Patient Details Name: Ashlee Fields MRN: [id number] DOB: Jun 26, 1956 Today's Date: 03/03/2020    History of Present Illness 64 y/o female s/p R total knee replacement.  Pt has MS with significant L sided involvement, h/o distant L TKA.    PT Comments    Pt was long sitting in bed upon arriving for first session. She is alert and much more awake this date versus previously observed. She agrees to PT session and was able to get OOB to chair in first session. Therapist returned an hour and half later to assist pt back to bed. In morning session, pt was able to stand EOB 3 x ~ 1 minute each trial with +2 mod assist. No knee buckling or LOB. She was able to lift RLE off floor in standing but unable to lift LLE. Pt does fatigue quickly but overall tolerated session well. Pt stood pivot to recliner from EOB via total assist without use of RW. Gait belt and BUE support on therapist. Max to stand but total to pivot. Once therapist return for 2nd session. Therapist total assisted pt back to bed and had pt perform there ex handout. R knee PROM 3-100 degrees after performing there ex. See exercises listed below for exercises perform. Pt is progressing well with PT but will benefit from CIR to address deficits and assist pt with returning to PLOF. She was supine in bed with knee immobilizer donned, polar care applied and call bell in reach. Spouse present throughout both session and is very supportive. Acute PT will continue to follow per current POC.    Follow Up Recommendations  CIR     Equipment Recommendations  Rolling walker with 5" wheels    Recommendations for Other Services       Precautions / Restrictions Precautions Precautions: Fall;Knee Precaution Booklet Issued: Yes (comment) Precaution Comments: issued HEP Required Braces or Orthoses: Knee Immobilizer - Right Knee Immobilizer - Right: Other (comment)(on when in bed and when not working with  PT) Restrictions Weight Bearing Restrictions: Yes RLE Weight Bearing: Weight bearing as tolerated    Mobility  Bed Mobility Overal bed mobility: Needs Assistance Bed Mobility: Sit to Supine     Supine to sit: Mod assist;Max assist;HOB elevated;+2 for safety/equipment Sit to supine: Max assist;HOB elevated   General bed mobility comments: pt required max assist to return to supine in bed after OOB activity. Assisted BLEs into bed.  Transfers Overall transfer level: Needs assistance Equipment used: Rolling walker (2 wheeled) Transfers: Sit to/from [facility] Sit to Stand: Mod assist;+2 safety/equipment;+2 physical assistance;From elevated surface Stand pivot transfers: Total assist;+2 safety/equipment       General transfer comment: Pt requesting to return to bed after sitting up in recliner ~ 1 hour. She required total assist to pivot back to EOB.  Ambulation/Gait             General Gait Details: unsafe to trial at this time   Stairs             Wheelchair Mobility    Modified Rankin (Stroke Patients Only)       Balance Overall balance assessment: Needs assistance Sitting-balance support: No upper extremity supported;Feet supported Sitting balance-Leahy Scale: Fair Sitting balance - Comments: no LOB noted while seated EOB. supervision only for sitting balance.   Standing balance support: Bilateral upper extremity supported Standing balance-Leahy Scale: Poor Standing balance comment: heavy use of BUE for support with + 2 therapist protecting from knee buckling. no  LOB                             Cognition Arousal/Alertness: Awake/alert Behavior During Therapy: WFL for tasks assessed/performed Overall Cognitive Status: Within Functional Limits for tasks assessed                                 General Comments: A&O x 4, pleasant      Exercises Total Joint Exercises Ankle Circles/Pumps: 10 reps;AROM Quad  Sets: Strengthening;15 reps Heel Slides: Strengthening;15 reps Hip ABduction/ADduction: Strengthening;15 reps Straight Leg Raises: AAROM;5 reps Knee Flexion: Supine;10 reps Goniometric ROM: 3-100 degrees    General Comments        Pertinent Vitals/Pain Pain Assessment: 0-10 Pain Score: 6  Faces Pain Scale: Hurts even more Pain Location: R knee Pain Descriptors / Indicators: Aching;Sore;Operative site guarding;Discomfort;Grimacing;Moaning Pain Intervention(s): Limited activity within patient's tolerance;Monitored during session;Premedicated before session;Repositioned;Ice applied    Home Living                      Prior Function            PT Goals (current goals can now be found in the care plan section) Acute Rehab PT Goals Patient Stated Goal: " Get to rehab so I can get better faster" Progress towards PT goals: Progressing toward goals    Frequency    BID      PT Plan Current plan remains appropriate    Co-evaluation              AM-PAC PT "6 Clicks" Mobility   Outcome Measure  Help needed turning from your back to your side while in a flat bed without using bedrails?: A Little Help needed moving from lying on your back to sitting on the side of a flat bed without using bedrails?: A Lot Help needed moving to and from a bed to a chair (including a wheelchair)?: A Lot Help needed standing up from a chair using your arms (e.g., wheelchair or bedside chair)?: A Lot Help needed to walk in hospital room?: Total Help needed climbing 3-5 steps with a railing? : Total 6 Click Score: 11    End of Session Equipment Utilized During Treatment: Gait belt Activity Tolerance: Patient tolerated treatment well Patient left: in bed;with call bell/phone within reach;with bed alarm set;with family/visitor present;with nursing/sitter in room Nurse Communication: Mobility status PT Visit Diagnosis: Muscle weakness (generalized) (M62.81);Difficulty in walking, not  elsewhere classified (R26.2);Pain Pain - Right/Left: Right Pain - part of body: Knee     Time: 1205-1233 PT Time Calculation (min) (ACUTE ONLY): 28 min  Charges:  $Therapeutic Exercise: 8-22 mins $Therapeutic Activity: 8-22 mins                    Julaine Fusi PTA 03/03/20, 1:40 PM

## 2020-03-03 NOTE — Progress Notes (Signed)
PROGRESS NOTE    Ashlee Fields  YIR:[id number] DOB: [birth year] DOA: 02/28/2020 PCP: [name], MD   Brief Narrative:  Ashlee Fields is an 64 y.o. female with a past medical history of hypertension, hyperlipidemia, diabetes mellitus, COPD, GERD, hypothyroidism, depression with anxiety, OSA on CPAP, multiple sclerosis, pulmonary hypertension, who is admitted by orthopedic surgeon, Dr. Mack Guise for left knee surgery.  Patient is day 1 s/p right total knee arthroplasty today. We are asked to consult due to cough and SOB.  Subjective: Patient was feeling better when seen today.  No new complaints.  Her respiratory symptoms has been resolved.  Waiting for CIR evaluation.  Assessment & Plan:   Principal Problem:   S/P TKR (total knee replacement) using cement, right Active Problems:   MS (multiple sclerosis) (HCC)   COPD exacerbation (HCC)   Depression with anxiety   Diabetes mellitus without complication (HCC)   HTN, goal below 140/90   Hyperlipidemia, unspecified   Hypothyroidism   GERD (gastroesophageal reflux disease)   Hematuria   Acute on chronic respiratory failure with hypoxia (HCC)  Acute on chronic respiratory failure with hypoxia. No  Prior diagnosis of COPD.  Patient is not a smoker.  She was saturating well on room air today.  She did get bronchitis quite easily and asking to switch her antibiotics to azithromycin as it always worked better for her.  Doxycycline causes diarrhea.  Doxycycline was discontinued and she was started on Z-Pak yesterday. -Continue supportive care with Mucinex and incentive spirometry. -Medically stable, respiratory symptoms has been resolved.  We will sign off.  Please call for any new concerns.  S/P TKR (total knee replacement) using cement, right: day 1 s/p of surgery -pain management per orthopedic surgery team.  Depression and anxiety: Stable, no suicidal or homicidal ideations. -Continue home medications  Diabetes mellitus  without complication (Ashlee Fields): Most recent A1c 7.5 on 01/30/20, poorly controled. Patient is taking Metformin semaglutide at home -SSI  HTN:  -Continue home medications: HCTZ and irbesartan -hydralazine prn  Hyperlipidemia, unspecified -lipitor  Hypothyroidism -Synthroid  GERD (gastroesophageal reflux disease) -Protonix  MS:  -continue home meds: Dimethyl fumarate (Tecfidera)  OSA: -on CPAP  Objective: Vitals:   03/02/20 1314 03/03/20 0014 03/03/20 0745 03/03/20 0854  BP:  120/63 125/69 (!) 126/53  Pulse:  98 90 90  Resp:  18 20   Temp:  99.3 F (37.4 C) 98.4 F (36.9 C)   TempSrc:  Oral Oral   SpO2: 92% 91% 98%   Weight:      Height:        Intake/Output Summary (Last 24 hours) at 03/03/2020 1420 Last data filed at 03/03/2020 1041 Gross per 24 hour  Intake 480 ml  Output 900 ml  Net -420 ml   Filed Weights   02/28/20 0659  Weight: 86.2 kg    Examination:  General exam: Appears calm and comfortable  Respiratory system: Clear to auscultation. Respiratory effort normal. Cardiovascular system: S1 & S2 heard, RRR. No JVD, murmurs, rubs, gallops or clicks. Gastrointestinal system: Soft, nontender, nondistended, bowel sounds positive. Central nervous system: Alert and oriented. No focal neurological deficits.Symmetric 5 x 5 power. Extremities: No edema, no cyanosis, pulses intact and symmetrical. Skin: No rashes, lesions or ulcers Psychiatry: Judgement and insight appear normal. Mood & affect appropriate.    DVT prophylaxis: Lovenox Code Status: Full Family Communication: Husband was updated at bedside. Disposition Plan: Orthopedic is primary.  Consultants:   [facility]  Procedures:  Antimicrobials:  Azithromycin  Data Reviewed: I have personally reviewed following labs and imaging studies  CBC: Recent Labs  Lab 02/29/20 0539 03/01/20 0417  WBC 12.7* 11.6*  HGB 10.0* 10.0*  HCT 30.6* 31.3*  MCV 90.8 93.2  PLT 343 811   Basic  Metabolic Panel: Recent Labs  Lab 02/29/20 0539  NA 141  K 3.9  CL 103  CO2 28  GLUCOSE 136*  BUN 13  CREATININE 0.63  CALCIUM 8.5*   GFR: Estimated Creatinine Clearance: 72.9 mL/min (by C-G formula based on SCr of 0.63 mg/dL). Liver Function Tests: No results for input(s): AST, ALT, ALKPHOS, BILITOT, PROT, ALBUMIN in the last 168 hours. No results for input(s): LIPASE, AMYLASE in the last 168 hours. No results for input(s): AMMONIA in the last 168 hours. Coagulation Profile: Recent Labs  Lab 02/28/20 0631  INR 0.9   Cardiac Enzymes: No results for input(s): CKTOTAL, CKMB, CKMBINDEX, TROPONINI in the last 168 hours. BNP (last 3 results) No results for input(s): PROBNP in the last 8760 hours. HbA1C: No results for input(s): HGBA1C in the last 72 hours. CBG: Recent Labs  Lab 03/02/20 1255 03/02/20 1705 03/02/20 2203 03/03/20 0744 03/03/20 1210  GLUCAP 116* 117* 110* 105* 173*   Lipid Profile: No results for input(s): CHOL, HDL, LDLCALC, TRIG, CHOLHDL, LDLDIRECT in the last 72 hours. Thyroid Function Tests: No results for input(s): TSH, T4TOTAL, FREET4, T3FREE, THYROIDAB in the last 72 hours. Anemia Panel: No results for input(s): VITAMINB12, FOLATE, FERRITIN, TIBC, IRON, RETICCTPCT in the last 72 hours. Sepsis Labs: No results for input(s): PROCALCITON, LATICACIDVEN in the last 168 hours.  Recent Results (from the past 240 hour(s))  Surgical pcr screen     Status: None   Collection Time: 02/24/20 10:46 AM   Specimen: Nasal Mucosa; Nasal Swab  Result Value Ref Range Status   MRSA, PCR NEGATIVE NEGATIVE Final   Staphylococcus aureus NEGATIVE NEGATIVE Final    Comment: (NOTE) The Xpert SA Assay (FDA approved for NASAL specimens in patients 66 years of age and older), is one component of a comprehensive surveillance program. It is not intended to diagnose infection nor to guide or monitor treatment. Performed at [facility], [place], [place] [address]   SARS CORONAVIRUS 2 (TAT 6-24 HRS) Nasopharyngeal Nasopharyngeal Swab     Status: None   Collection Time: 02/27/20  9:39 AM   Specimen: Nasopharyngeal Swab  Result Value Ref Range Status   SARS Coronavirus 2 NEGATIVE NEGATIVE Final    Comment: (NOTE) SARS-CoV-2 target nucleic acids are NOT DETECTED. The SARS-CoV-2 RNA is generally detectable in upper and lower respiratory specimens during the acute phase of infection. Negative results do not preclude SARS-CoV-2 infection, do not rule out co-infections with other pathogens, and should not be used as the sole basis for treatment or other patient management decisions. Negative results must be combined with clinical observations, patient history, and epidemiological information. The expected result is Negative. Fact Sheet for Patients: SugarRoll.be Fact Sheet for Healthcare Providers: https://www.woods-mathews.com/ This test is not yet approved or cleared by the [place] FDA and  has been authorized for detection and/or diagnosis of SARS-CoV-2 by FDA under an Emergency Use Authorization (EUA). This EUA will remain  in effect (meaning this test can be used) for the duration of the COVID-19 declaration under Section 56 4(b)(1) of the Act, 21 U.S.C. section 360bbb-3(b)(1), unless the authorization is terminated or revoked sooner. Performed at [facility], [place],  [place] [address]   Culture, Urine     Status: None   Collection Time: 02/29/20  7:30 AM   Specimen: Urine, Catheterized  Result Value Ref Range Status   Specimen Description   Final    URINE, CATHETERIZED Performed at [facility], [address] [place], [place] [address]    Special Requests   Final    NONE Performed at [facility], [address] [place],  [address]    Culture   Final    NO GROWTH Performed at [facility], [place] [address], [place],  [address]    Report Status 03/01/2020 FINAL  Final     Radiology Studies: No results found.  Scheduled Meds: . atorvastatin  80 mg Oral QHS  . azithromycin  250 mg Oral Daily  . baclofen  40 mg Oral QHS  . cyanocobalamin  1,000 mcg Intramuscular Q30 days  . dextromethorphan-guaiFENesin  1 tablet Oral BID  . Dimethyl Fumarate  240 mg Oral BID  . docusate sodium  100 mg Oral BID  . enoxaparin (LOVENOX) injection  40 mg Subcutaneous Q24H  . FLUoxetine  60 mg Oral Daily  . gabapentin  300 mg Oral TID  . hydrochlorothiazide  25 mg Oral Daily  . hydrochlorothiazide  12.5 mg Oral Daily   And  . irbesartan  300 mg Oral Daily  . insulin aspart  0-15 Units Subcutaneous TID AC & HS  . insulin aspart  0-5 Units Subcutaneous QHS  . levothyroxine  50 mcg Oral Q0600  . LORazepam  1 mg Oral BID  . mirabegron ER  50 mg Oral Daily  . multivitamin with minerals  1 tablet Oral QHS  . pantoprazole  40 mg Oral Daily  . Phendimetrazine Tartrate  35 mg Oral TID  . predniSONE  10 mg Oral Q breakfast  . traMADol  50 mg Oral Q6H   Continuous Infusions:   LOS: 4 days   Time spent: 30 minutes  Lorella Nimrod, MD [facility]  If 7PM-7AM, please contact night-coverage [email or web address]  03/03/2020, 2:20 PM   This record has been created using Systems analyst. Errors have been sought and corrected,but may not always be located. Such creation errors do not reflect on the standard of care.

## 2020-03-03 NOTE — Progress Notes (Signed)
  Subjective:  POD #4 s/p right TKA.  RN and husband are in the room. Patient reports right knee pain as mild to moderate.  Patient states she is making progress with physical therapy.  Objective:   VITALS:   Vitals:   03/02/20 1314 03/03/20 0014 03/03/20 0745 03/03/20 0854  BP:  120/63 125/69 (!) 126/53  Pulse:  98 90 90  Resp:  18 20   Temp:  99.3 F (37.4 C) 98.4 F (36.9 C)   TempSrc:  Oral Oral   SpO2: 92% 91% 98%   Weight:      Height:        PHYSICAL EXAM: Right lower extremity: I changed the patient's dressing with Clarise Cruz her nurse today.  Incision is clean dry and intact.  Patient has no erythema.  There is resolving ecchymosis over the medial proximal leg.  Leg compartments are soft and compressible.  Distally she is neurovascular intact with intact motor function. Neurovascular intact Sensation intact distally Intact pulses distally Dorsiflexion/Plantar flexion intact Incision: no drainage No cellulitis present Compartment soft  LABS  Results for orders placed or performed during the hospital encounter of 02/28/20 (from the past 24 hour(s))  Glucose, capillary     Status: Abnormal   Collection Time: 03/02/20  5:05 PM  Result Value Ref Range   Glucose-Capillary 117 (H) 70 - 99 mg/dL   Comment 1 Notify RN   Glucose, capillary     Status: Abnormal   Collection Time: 03/02/20 10:03 PM  Result Value Ref Range   Glucose-Capillary 110 (H) 70 - 99 mg/dL  Glucose, capillary     Status: Abnormal   Collection Time: 03/03/20  7:44 AM  Result Value Ref Range   Glucose-Capillary 105 (H) 70 - 99 mg/dL   Comment 1 Notify RN   Glucose, capillary     Status: Abnormal   Collection Time: 03/03/20 12:10 PM  Result Value Ref Range   Glucose-Capillary 173 (H) 70 - 99 mg/dL   Comment 1 Notify RN     No results found.  Assessment/Plan: 4 Days Post-Op   Principal Problem:   S/P TKR (total knee replacement) using cement, right Active Problems:   MS (multiple sclerosis)  (HCC)   COPD exacerbation (HCC)   Depression with anxiety   Diabetes mellitus without complication (HCC)   HTN, goal below 140/90   Hyperlipidemia, unspecified   Hypothyroidism   GERD (gastroesophageal reflux disease)   Hematuria   Acute on chronic respiratory failure with hypoxia ([place])  Patient progressing postop.  Patient improving with physical therapy.  Patient awaiting approval for skilled nursing facility.  Continue Lovenox for DVT prophylaxis.  Urine culture remains negative.  Continue Lovenox for DVT prophylaxis.  Continue tight management of patient's blood sugars.  Breathing improved, complete course of azithromycin as ordered by hospitalist.   Thornton Park , MD 03/03/2020, 2:41 PM

## 2020-03-04 LAB — GLUCOSE, CAPILLARY
Glucose-Capillary: 86 mg/dL (ref 70–99)
Glucose-Capillary: 106 mg/dL — ABNORMAL HIGH (ref 70–99)
Glucose-Capillary: 176 mg/dL — ABNORMAL HIGH (ref 70–99)
Glucose-Capillary: 143 mg/dL — ABNORMAL HIGH (ref 70–99)

## 2020-03-04 NOTE — Progress Notes (Signed)
  Subjective:  POD #5 s/p right TKA.   Patient reports right knee pain as mild.  Patient states she is making good progress with physical therapy.  Her husband is at the bedside.  Patient is awaiting placement at [facility] rehab.  Objective:   VITALS:   Vitals:   03/03/20 1555 03/04/20 0021 03/04/20 0740 03/04/20 1530  BP: 129/61 114/65 127/61 122/69  Pulse: 96 90 91 94  Resp:  16    Temp: 98.5 F (36.9 C) 98.6 F (37 C) 98.9 F (37.2 C) 98.9 F (37.2 C)  TempSrc: Oral Oral Oral Oral  SpO2: 95% 96% 97% 99%  Weight:      Height:        PHYSICAL EXAM: Right lower extremity Neurovascular intact Sensation intact distally Intact pulses distally Dorsiflexion/Plantar flexion intact Incision: dressing C/D/I No cellulitis present Compartment soft  LABS  Results for orders placed or performed during the hospital encounter of 02/28/20 (from the past 24 hour(s))  Glucose, capillary     Status: Abnormal   Collection Time: 03/03/20  9:38 PM  Result Value Ref Range   Glucose-Capillary 123 (H) 70 - 99 mg/dL  Glucose, capillary     Status: None   Collection Time: 03/04/20  7:42 AM  Result Value Ref Range   Glucose-Capillary 86 70 - 99 mg/dL   Comment 1 Notify RN   Glucose, capillary     Status: Abnormal   Collection Time: 03/04/20 11:36 AM  Result Value Ref Range   Glucose-Capillary 106 (H) 70 - 99 mg/dL   Comment 1 Notify RN   Glucose, capillary     Status: Abnormal   Collection Time: 03/04/20  4:47 PM  Result Value Ref Range   Glucose-Capillary 176 (H) 70 - 99 mg/dL   Comment 1 Notify RN     No results found.  Assessment/Plan: 5 Days Post-Op   Principal Problem:   S/P TKR (total knee replacement) using cement, right Active Problems:   MS (multiple sclerosis) (HCC)   COPD exacerbation (HCC)   Depression with anxiety   Diabetes mellitus without complication (HCC)   HTN, goal below 140/90   Hyperlipidemia, unspecified   Hypothyroidism   GERD (gastroesophageal  reflux disease)   Hematuria   Acute on chronic respiratory failure with hypoxia ([place])  Patient continues to improve postop.  She is making good progress with physical therapy.  Continue Lovenox for DVT prophylaxis.  Possible discharge to rehab.    Thornton Park , MD 03/04/2020, 6:27 PM

## 2020-03-04 NOTE — Plan of Care (Signed)

## 2020-03-04 NOTE — Progress Notes (Signed)
Physical Therapy Treatment Patient Details Name: Ashlee Fields MRN: [id number] DOB: 1956/09/13 Today's Date: 03/04/2020    History of Present Illness 64 y/o female s/p R total knee replacement.  Pt has MS with significant L sided involvement, h/o distant L TKA.    PT Comments    Agrees to session but does not want to get in chair today.  Participated in exercises as described below.  Stretching in supine.  To edge of bed with increased time to scoot to edge on her own and cues for hand placements.  She does need mod a x 1 to get trunk off of bed but overall improved.  Sitting with min guard once steady and is able to sit for 10  Minutes unsupported.  Husband in and assists with standing and EOB with KI donned and mod a x 2 to stand.  She is able to progress to min a x 2 to stand for just under 1 minute before asking to sit.  During seated rest for second attempt, she does report some dizziness and is assisted to supine with max a x 1.  Rolling left right to adjust pads with cues and dizziness resolves.  She remains highly motivated to progress and return home.  Husband is supportive and attentive to needs and gladly assists.   Follow Up Recommendations  CIR     Equipment Recommendations  Rolling walker with 5" wheels    Recommendations for Other Services       Precautions / Restrictions Precautions Precautions: Fall;Knee Precaution Comments: issued HEP Required Braces or Orthoses: Knee Immobilizer - Right Restrictions Weight Bearing Restrictions: Yes RLE Weight Bearing: Weight bearing as tolerated    Mobility  Bed Mobility Overal bed mobility: Needs Assistance Bed Mobility: Sit to Supine;Supine to Sit     Supine to sit: Mod assist Sit to supine: Max assist;HOB elevated   General bed mobility comments: increased time and verbal cues but she is able to advance to EOB but needs help raising trunk  Transfers Overall transfer level: Needs assistance Equipment used: Rolling  walker (2 wheeled) Transfers: Sit to/from Stand Sit to Stand: Mod assist;+2 safety/equipment;+2 physical assistance;From elevated surface         General transfer comment: requested to stand but remain in bed today.  Ambulation/Gait             General Gait Details: unsafe to trial at this time   Stairs             Wheelchair Mobility    Modified Rankin (Stroke Patients Only)       Balance Overall balance assessment: Needs assistance Sitting-balance support: No upper extremity supported;Feet supported Sitting balance-Leahy Scale: Fair     Standing balance support: Bilateral upper extremity supported Standing balance-Leahy Scale: Poor Standing balance comment: heavy use of BUE for support with + 2                            Cognition Arousal/Alertness: Awake/alert Behavior During Therapy: WFL for tasks assessed/performed Overall Cognitive Status: Within Functional Limits for tasks assessed                                 General Comments: A&O x 4, pleasant      Exercises Total Joint Exercises Ankle Circles/Pumps: 10 reps;AROM Quad Sets: Strengthening;15 reps(PROM overpressure for extension) Short Arc Quad: AAROM;AROM;15 reps(Pt inconsistently  able to do AROM, fatigued with inc reps) Heel Slides: Strengthening;15 reps(resisted leg extensions) Hip ABduction/ADduction: Strengthening;15 reps Straight Leg Raises: AAROM;5 reps(pt unable to raise independently, great effort) Knee Flexion: PROM;5 reps Goniometric ROM: 0-80 stretchin in supine today    General Comments        Pertinent Vitals/Pain Pain Assessment: Faces Faces Pain Scale: Hurts even more Pain Location: R knee Pain Descriptors / Indicators: Aching;Sore;Operative site guarding;Discomfort;Grimacing;Moaning Pain Intervention(s): Limited activity within patient's tolerance;Monitored during session;Premedicated before session    Home Living                       Prior Function            PT Goals (current goals can now be found in the care plan section) Progress towards PT goals: Progressing toward goals    Frequency    BID      PT Plan Current plan remains appropriate    Co-evaluation              AM-PAC PT "6 Clicks" Mobility   Outcome Measure  Help needed turning from your back to your side while in a flat bed without using bedrails?: A Little Help needed moving from lying on your back to sitting on the side of a flat bed without using bedrails?: A Lot Help needed moving to and from a bed to a chair (including a wheelchair)?: A Lot Help needed standing up from a chair using your arms (e.g., wheelchair or bedside chair)?: A Lot Help needed to walk in hospital room?: Total Help needed climbing 3-5 steps with a railing? : Total 6 Click Score: 11    End of Session Equipment Utilized During Treatment: Gait belt Activity Tolerance: Patient tolerated treatment well Patient left: in bed;with call bell/phone within reach;with bed alarm set;with family/visitor present Nurse Communication: Mobility status Pain - Right/Left: Right Pain - part of body: Knee     Time: 1638-4536 PT Time Calculation (min) (ACUTE ONLY): 39 min  Charges:  $Therapeutic Exercise: 23-37 mins $Therapeutic Activity: 8-22 mins                    [name], PTA 03/04/20, 2:03 PM

## 2020-03-05 LAB — GLUCOSE, CAPILLARY
Glucose-Capillary: 92 mg/dL (ref 70–99)
Glucose-Capillary: 163 mg/dL — ABNORMAL HIGH (ref 70–99)
Glucose-Capillary: 158 mg/dL — ABNORMAL HIGH (ref 70–99)
Glucose-Capillary: 183 mg/dL — ABNORMAL HIGH (ref 70–99)

## 2020-03-05 NOTE — Progress Notes (Signed)
PT Cancellation Note  Patient Details Name: Ashlee Fields MRN: [id number] DOB: 12-Jul-1956   Cancelled Treatment:    Reason Eval/Treat Not Completed: Fatigue/lethargy limiting ability to participate   Attempted session after lunch but pt remained nauseous and had just received pain medication needing to rest.  Returned later and pt sleeping soundly.  Husband in room and discussed with him.  She did not awaken despite conversation and he decided I should let her sleep.  Will continue as appropriate tomorrow.    [name] 03/05/2020, 3:21 PM

## 2020-03-05 NOTE — Progress Notes (Signed)
  Subjective:  POD #7 s/p Right TKA.   Patient reports right pain as mild.  Patient explains that she had an episode of nausea and vomiting when she got up with physical therapy this morning.  Patient was sleeping when the therapist came by this afternoon and she only was able to do 1 session today.  Patient's husband is at the bedside.  I spoke with the case manager who states that the patient is still waiting to be approved by her insurance to go to the [facility].  Objective:   VITALS:   Vitals:   03/04/20 0740 03/04/20 1530 03/05/20 0004 03/05/20 0742  BP: 127/61 122/69 133/64 119/69  Pulse: 91 94 94 86  Resp:   16   Temp: 98.9 F (37.2 C) 98.9 F (37.2 C) 98.3 F (36.8 C) 98.5 F (36.9 C)  TempSrc: Oral Oral Oral Oral  SpO2: 97% 99% 92% 94%  Weight:      Height:        PHYSICAL EXAM: Right lower extremity Neurovascular intact Sensation intact distally Intact pulses distally Dorsiflexion/Plantar flexion intact Incision: no drainage No cellulitis present Compartment soft  LABS  Results for orders placed or performed during the hospital encounter of 02/28/20 (from the past 24 hour(s))  Glucose, capillary     Status: Abnormal   Collection Time: 03/04/20  4:47 PM  Result Value Ref Range   Glucose-Capillary 176 (H) 70 - 99 mg/dL   Comment 1 Notify RN   Glucose, capillary     Status: Abnormal   Collection Time: 03/04/20  9:03 PM  Result Value Ref Range   Glucose-Capillary 143 (H) 70 - 99 mg/dL  Glucose, capillary     Status: None   Collection Time: 03/05/20  7:43 AM  Result Value Ref Range   Glucose-Capillary 92 70 - 99 mg/dL  Glucose, capillary     Status: Abnormal   Collection Time: 03/05/20 11:55 AM  Result Value Ref Range   Glucose-Capillary 163 (H) 70 - 99 mg/dL    No results found.  Assessment/Plan: 6 Days Post-Op   Principal Problem:   S/P TKR (total knee replacement) using cement, right Active Problems:   MS (multiple sclerosis) (HCC)   COPD  exacerbation (HCC)   Depression with anxiety   Diabetes mellitus without complication (HCC)   HTN, goal below 140/90   Hyperlipidemia, unspecified   Hypothyroidism   GERD (gastroesophageal reflux disease)   Hematuria   Acute on chronic respiratory failure with hypoxia [facility])  Patient is doing well from orthopedic standpoint.  Continue physical therapy.  Patient awaiting insurance approval for rehab.    Thornton Park , MD 03/05/2020, 3:51 PM

## 2020-03-05 NOTE — Progress Notes (Signed)
Inpatient Rehabilitation-Admissions Coordinator   Called pt's insurance company this AM. Her IP Rehab case is still pended for review. Will update once there has been a determination.   Raechel Ache, OTR/L  Rehab Admissions Coordinator  [phone] 03/05/2020 10:16 AM

## 2020-03-05 NOTE — Progress Notes (Signed)
Physical Therapy Treatment Patient Details Name: Ashlee Fields MRN: [id number] DOB: August 04, 1956 Today's Date: 03/05/2020    History of Present Illness 64 y/o female s/p R total knee replacement.  Pt has MS with significant L sided involvement, h/o distant L TKA.    PT Comments    Pt in bed, ready to participate.  Participated in exercises as described below.  She is able to get to EOB with min/mod a x 1 and increased time with verbal cues for hand placements and sequencing. Once sitting, she is generally steady but requires supervision for safety.  She c/o dizziness after several moments.  BP 116/58 P 98.  After 3 minutes BP 112/56 P 99.  She remains with some c/o but with BP stable agrees to try standing.  Mod a x 2 to stand with KI donned as she is unable to SLR RLE and with LLE weakness it is used for increased support/safety.  Cues to stand upright for BP 91/76 P 109.  BP 122/52 P 103 in sitting.  She agrees to try to get to chair and was able to stand and take 2 small steps to recliner but self initiated sit on bed cue to continues c/o and general fear of not feeling well.  Returned to supine and settled to rest.  Discussed with RN BP and request for nausea meds.  CIR remains a good option for pt for discharge given her strong motivation and good family support.  If CIR is declines, pt is aware she will need continued therapy prior to going home and is open to SNF [facility] being her first choice).  Will continue with BID sessions to progress strength and mobility per goals.   Follow Up Recommendations  CIR     Equipment Recommendations  Rolling walker with 5" wheels    Recommendations for Other Services       Precautions / Restrictions Precautions Precautions: Fall;Knee Precaution Comments: issued HEP Required Braces or Orthoses: Knee Immobilizer - Right Restrictions Weight Bearing Restrictions: Yes RLE Weight Bearing: Weight bearing as tolerated    Mobility  Bed  Mobility Overal bed mobility: Needs Assistance Bed Mobility: Sit to Supine;Supine to Sit     Supine to sit: Mod assist Sit to supine: Mod assist;+2 for physical assistance      Transfers Overall transfer level: Needs assistance Equipment used: Rolling walker (2 wheeled) Transfers: Sit to/from Stand Sit to Stand: Mod assist;+2 safety/equipment;+2 physical assistance;From elevated surface            Ambulation/Gait Ambulation/Gait assistance: Mod assist;+2 physical assistance Gait Distance (Feet): 3 Feet Assistive device: Rolling walker (2 wheeled) Gait Pattern/deviations: Step-to pattern Gait velocity: decreased   General Gait Details: 2 steps along edge of bed limited by dizziness, nausea and general fear of movement despite +2 assist.   Stairs             Wheelchair Mobility    Modified Rankin (Stroke Patients Only)       Balance Overall balance assessment: Needs assistance Sitting-balance support: No upper extremity supported;Feet supported Sitting balance-Leahy Scale: Fair     Standing balance support: Bilateral upper extremity supported Standing balance-Leahy Scale: Poor Standing balance comment: heavy use of BUE for support with + 2                            Cognition Arousal/Alertness: Awake/alert Behavior During Therapy: WFL for tasks assessed/performed Overall Cognitive Status: Within Functional Limits  for tasks assessed                                 General Comments: A&O x 4, pleasant      Exercises Total Joint Exercises Ankle Circles/Pumps: 10 reps;AROM Quad Sets: Strengthening;15 reps(PROM overpressure for extension) Short Arc Quad: AAROM;AROM;15 reps(Pt inconsistently able to do AROM, fatigued with inc reps) Heel Slides: Strengthening;15 reps(resisted leg extensions) Hip ABduction/ADduction: Strengthening;15 reps Straight Leg Raises: AAROM;5 reps(pt unable to raise independently, great effort) Knee  Flexion: PROM;5 reps    General Comments        Pertinent Vitals/Pain Pain Assessment: 0-10 Faces Pain Scale: Hurts even more Pain Location: R knee Pain Descriptors / Indicators: Aching;Sore;Operative site guarding;Discomfort;Grimacing;Moaning Pain Intervention(s): Limited activity within patient's tolerance;Monitored during session;Premedicated before session    Home Living                      Prior Function            PT Goals (current goals can now be found in the care plan section) Progress towards PT goals: Progressing toward goals    Frequency    BID      PT Plan Current plan remains appropriate    Co-evaluation              AM-PAC PT "6 Clicks" Mobility   Outcome Measure  Help needed turning from your back to your side while in a flat bed without using bedrails?: A Little Help needed moving from lying on your back to sitting on the side of a flat bed without using bedrails?: A Lot Help needed moving to and from a bed to a chair (including a wheelchair)?: A Lot Help needed standing up from a chair using your arms (e.g., wheelchair or bedside chair)?: A Lot Help needed to walk in hospital room?: Total Help needed climbing 3-5 steps with a railing? : Total 6 Click Score: 11    End of Session Equipment Utilized During Treatment: Gait belt Activity Tolerance: Treatment limited secondary to medical complications (Comment) Patient left: in bed;with call bell/phone within reach;with bed alarm set Nurse Communication: Mobility status;Other (comment) Pain - Right/Left: Right Pain - part of body: Knee     Time: 1016-1110 PT Time Calculation (min) (ACUTE ONLY): 54 min  Charges:  $Therapeutic Exercise: 23-37 mins $Therapeutic Activity: 23-37 mins                    [name], PTA 03/05/20, 11:22 AM

## 2020-03-05 NOTE — Plan of Care (Signed)
  Problem: Education: Goal: Knowledge of General Education information will improve Description: Including pain rating scale, medication(s)/side effects and non-pharmacologic comfort measures Outcome: Progressing   Problem: Clinical Measurements: Goal: Ability to maintain clinical measurements within normal limits will improve Outcome: Progressing Goal: Will remain free from infection Outcome: Progressing Goal: Diagnostic test results will improve Outcome: Progressing Goal: Respiratory complications will improve Outcome: Progressing Goal: Cardiovascular complication will be avoided Outcome: Progressing   Problem: Activity: Goal: Risk for activity intolerance will decrease Outcome: Progressing   Problem: Coping: Goal: Level of anxiety will decrease Outcome: Progressing   Problem: Pain Managment: Goal: General experience of comfort will improve Outcome: Progressing

## 2020-03-05 NOTE — TOC Progression Note (Addendum)
Transition of Care [facility]) - Progression Note    Patient Details  Name: Ashlee Fields MRN: [id number] Date of Birth: [birth year]  Transition [facility]) CM/SW Contact  Ledora Delker, Gardiner Rhyme, LCSW Phone Number: 03/05/2020, 11:09 AM  Clinical Narrative:   Damaris Schooner with [facility] still awaiting insurance authorization. Made pt aware of this. Pt asked again about [facility] reminded her she was denied there due to expensive insurance and will not be allowed to bring her own in. Discussed other facilities in case CIR is denied. She would be agreeable to [facility] due to she knows a PT there. In case CIR is denied, back up plan.    Expected Discharge Plan: [place] Barriers to Discharge: Continued Medical Work up  Expected Discharge Plan and Services Expected Discharge Plan: [place] In-house Referral: Clinical Social Work   Post Acute Care Choice: [place] Living arrangements for the past 2 months: Single Family Home                                       Social Determinants of Health (SDOH) Interventions    Readmission Risk Interventions No flowsheet data found.

## 2020-03-05 NOTE — Care Management Important Message (Signed)
Important Message  Patient Details  Name: Ashlee Fields MRN: [id number] Date of Birth: 1956-04-03   Medicare Important Message Given:  Yes     [name] 03/05/2020, 10:59 AM

## 2020-03-06 LAB — GLUCOSE, CAPILLARY
Glucose-Capillary: 92 mg/dL (ref 70–99)
Glucose-Capillary: 133 mg/dL — ABNORMAL HIGH (ref 70–99)
Glucose-Capillary: 166 mg/dL — ABNORMAL HIGH (ref 70–99)
Glucose-Capillary: 151 mg/dL — ABNORMAL HIGH (ref 70–99)

## 2020-03-06 NOTE — TOC Progression Note (Signed)
Transition of Care [facility]) - Progression Note    Patient Details  Name: KAYLANNI EZELLE MRN: [id number] Date of Birth: 02/22/1956  Transition of Care [facility]) CM/SW Contact  Bethene Hankinson, Gardiner Rhyme, LCSW Phone Number: 03/06/2020, 1:25 PM  Clinical Narrative:  Pt has been denied for CIR. Have done a bed search and pt is willing to go to [facility] since [facility] will not take her due to medications. Have reached out to [facility] to start insurance auth for admission.      Expected Discharge Plan: [place] Barriers to Discharge: Continued Medical Work up  Expected Discharge Plan and Services Expected Discharge Plan: [place] In-house Referral: Clinical Social Work   Post Acute Care Choice: [place] Living arrangements for the past 2 months: Single Family Home                                       Social Determinants of Health (SDOH) Interventions    Readmission Risk Interventions No flowsheet data found.

## 2020-03-06 NOTE — Progress Notes (Signed)
Physical Therapy Treatment Patient Details Name: Ashlee Fields MRN: [id number] DOB: [birth year] Today's Date: 03/06/2020    History of Present Illness 64 y/o female s/p R total knee replacement.  Pt has MS with significant L sided involvement, h/o distant L TKA.    PT Comments    Pt continues to show great effort and determination but struggled with AM session this date.  She did well with exercises in bed showing some increased consistency with quad control/strength but she was unable to get to fully upright standing despite heavy assist and great effort.  She c/o considerable pain (despite being seen ~1 hr post meds) and could not tolerate WBing well.  Overall pt continues to make slow gains but continues to be motivated to do all she can with PT.     Follow Up Recommendations  CIR     Equipment Recommendations  Rolling walker with 5" wheels    Recommendations for Other Services       Precautions / Restrictions Precautions Precautions: Fall;Knee Precaution Booklet Issued: Yes (comment) Required Braces or Orthoses: Knee Immobilizer - Right Restrictions Weight Bearing Restrictions: Yes RLE Weight Bearing: Weight bearing as tolerated    Mobility  Bed Mobility Overal bed mobility: Needs Assistance Bed Mobility: Supine to Sit     Supine to sit: Mod assist     General bed mobility comments: Pt needed some assist to shift hips/legs toward EOB, then HHA to assist with transition to vertical.    Transfers Overall transfer level: Needs assistance Equipment used: Rolling walker (2 wheeled) Transfers: Sit to/from Stand Sit to Stand: Max assist Stand pivot transfers: Max assist       General transfer comment: 4 attempts at sit to stand with elevating bed height.  Pt was unable to get hips over BOS or truly attain standing on any attempt.  Even with cuing and foot blocking pt struggled to keep weight through either LE and struggled to utilize walker appropriately with either  UE.  Pt c/o fatigue, pain and dizziness (BP was 130/80 in sitting pre-standing)  Max assist transfer bed to recliner after multiple standing attempts.  Ambulation/Gait             General Gait Details: unable this session   Stairs             Wheelchair Mobility    Modified Rankin (Stroke Patients Only)       Balance Overall balance assessment: Needs assistance Sitting-balance support: No upper extremity supported;Feet supported Sitting balance-Leahy Scale: Fair Sitting balance - Comments: Pt initially reports dizziness and needed direct assist to maintain balance (checked BP: 130/80) symptoms did eventually subside and she was able to maintain sitting balance w/ only CGA     Standing balance-Leahy Scale: Zero Standing balance comment: heavy use of BUE for support, unable to attain upright/hips forward                            Cognition Arousal/Alertness: Awake/alert Behavior During Therapy: WFL for tasks assessed/performed Overall Cognitive Status: Within Functional Limits for tasks assessed                                        Exercises Total Joint Exercises Ankle Circles/Pumps: AROM;15 reps Quad Sets: Strengthening;15 reps Short Arc Quad: AAROM;AROM;15 reps Heel Slides: Strengthening;15 reps(with resisted leg ext) Hip ABduction/ADduction: Strengthening;15  reps Straight Leg Raises: AAROM;AROM;10 reps(5 warm up reps with AAROM, then 5x AROM) Knee Flexion: PROM;5 reps Goniometric ROM: 3-103    General Comments        Pertinent Vitals/Pain Pain Assessment: 0-10 Pain Score: 8  Pain Location: very minimal pain at rest, increases significantly w/ any activity    Home Living                      Prior Function            PT Goals (current goals can now be found in the care plan section) Acute Rehab PT Goals PT Goal Formulation: With patient Time For Goal Achievement: 03/13/20 Potential to Achieve Goals:  Good Progress towards PT goals: (pt struggling with pain and dizziness this date)    Frequency    BID      PT Plan Current plan remains appropriate    Co-evaluation              AM-PAC PT "6 Clicks" Mobility   Outcome Measure  Help needed turning from your back to your side while in a flat bed without using bedrails?: A Little Help needed moving from lying on your back to sitting on the side of a flat bed without using bedrails?: A Lot Help needed moving to and from a bed to a chair (including a wheelchair)?: Total Help needed standing up from a chair using your arms (e.g., wheelchair or bedside chair)?: Total Help needed to walk in hospital room?: Total Help needed climbing 3-5 steps with a railing? : Total 6 Click Score: 9    End of Session Equipment Utilized During Treatment: Gait belt Activity Tolerance: Treatment limited secondary to medical complications (Comment) Patient left: in bed;with call bell/phone within reach;with bed alarm set Nurse Communication: Mobility status;Other (comment) PT Visit Diagnosis: Muscle weakness (generalized) (M62.81);Difficulty in walking, not elsewhere classified (R26.2);Pain Pain - Right/Left: Right Pain - part of body: Knee     Time: 5681-2751 PT Time Calculation (min) (ACUTE ONLY): 62 min  Charges:  $Therapeutic Exercise: 23-37 mins $Therapeutic Activity: 23-37 mins                      Kreg Shropshire, DPT 03/06/2020, 1:46 PM

## 2020-03-06 NOTE — TOC Progression Note (Signed)
Transition of Care [facility]) - Progression Note    Patient Details  Name: Ashlee Fields MRN: [id number] Date of Birth: 04-02-1956  [facility]) CM/SW Contact  Abdurahman Rugg, Gardiner Rhyme, LCSW Phone Number: 03/06/2020, 2:41 PM  Clinical Narrative:   Checked with [facility] and they are not able to offer bed due to cost of her MS medication and family can not bring it in due to DON said no. Am checking with [facility] to see if they can do this and accommodate pt. Discussed with pt and husband to expand the bed search to [place], if [facility] can not take MS meds from home.    Expected Discharge Plan: [place] Barriers to Discharge: Continued Medical Work up  Expected Discharge Plan and Services Expected Discharge Plan: [place] In-house Referral: Clinical Social Work   Post Acute Care Choice: [place] Living arrangements for the past 2 months: Single Family Home                                       Social Determinants of Health (SDOH) Interventions    Readmission Risk Interventions No flowsheet data found.

## 2020-03-06 NOTE — Progress Notes (Signed)
Occupational Therapy Treatment Patient Details Name: Ashlee Fields MRN: [id number] DOB: 26-May-1956 Today's Date: 03/06/2020    History of present illness 64 y/o female s/p R total knee replacement.  Pt has MS with significant L sided involvement, h/o distant L TKA.   OT comments  Ashlee Fields was seen for OT treatment on this date. Upon arrival to room pt awake/alert, seated upright in room recliner, with husband present at bedside. Pt endorses significant RLE pain, but is agreeable to OT tx session/discussion regarding AE for LB ADL management. Pt/caregiver educated on use of long handled reacher and sock-aid for LB ADL management. Both return verbalized understanding of education provided, however, pt declines to return demonstrate this date 2/2 pain. Session concluded with PT in room to begin PM tx session. Pt making good progress toward goals and continues to benefit from skilled OT services to maximize return to PLOF and minimize risk of future falls, injury, caregiver burden, and readmission. Will continue to follow POC as written. Discharge recommendation remains appropriate.     Follow Up Recommendations  CIR    Equipment Recommendations  None recommended by OT    Recommendations for Other Services      Precautions / Restrictions Precautions Precautions: Fall;Knee Precaution Booklet Issued: Yes (comment) Required Braces or Orthoses: Knee Immobilizer - Right Knee Immobilizer - Right: Other (comment)(Per notes, on while in bed and not working with PT) Restrictions Weight Bearing Restrictions: Yes RLE Weight Bearing: Weight bearing as tolerated       Mobility Bed Mobility Overal bed mobility: Needs Assistance Bed Mobility: Supine to Sit     Supine to sit: Mod assist     General bed mobility comments: Deferred. Pt up in recliner at start/end of session. Per chart, pt mod A +2 for bed mobility.  Transfers Overall transfer level: Needs assistance Equipment used: Rolling  walker (2 wheeled) Transfers: Sit to/from Stand Sit to Stand: Max assist Stand pivot transfers: Max assist       General transfer comment: 4 attempts at sit to stand with elevating bed height.  Pt was unable to get hips over BOS or truly attain standing on any attempt.  Even with cuing and foot blocking pt struggled to keep weight through either LE and struggled to utilize walker appropriately with either UE.  Pt c/o fatigue, pain and dizziness (BP was 130/80 in sitting pre-standing)  Max assist transfer bed to recliner after multiple standing attempts.    Balance Overall balance assessment: Needs assistance Sitting-balance support: No upper extremity supported;Feet supported Sitting balance-Leahy Scale: Fair Sitting balance - Comments: Steady static/dynamic sitting in recliner.     Standing balance-Leahy Scale: Zero Standing balance comment: heavy use of BUE for support, unable to attain upright/hips forward                           ADL either performed or assessed with clinical judgement   ADL Overall ADL's : Needs assistance/impaired                     Lower Body Dressing: Maximal assistance;Cueing for safety;Cueing for sequencing;Bed level Lower Body Dressing Details (indicate cue type and reason): Pt and caregiver educated on use of AE for LB dressing this date. Pt educated on use of LH reacher and sock aid. Pt endorses she is familiar with both tools from past therapy sessions. Return verbalizes understanding of education provided. Declines to return demonstrate this date 2/2 pain.  Toilet Transfer: Maximal assistance;BSC;+2 for physical assistance;+2 for safety/equipment Toilet Transfer Details (indicate cue type and reason): lateral scoots to drop arm BSC                 Vision Baseline Vision/History: Wears glasses Patient Visual Report: No change from baseline     Perception     Praxis      Cognition Arousal/Alertness: Awake/alert Behavior  During Therapy: WFL for tasks assessed/performed Overall Cognitive Status: Within Functional Limits for tasks assessed                                          Exercises Exercises: Total Joint Total Joint Exercises Ankle Circles/Pumps: AROM;15 reps Quad Sets: Strengthening;15 reps Short Arc Quad: AAROM;AROM;15 reps Heel Slides: Strengthening;15 reps(with resisted leg ext) Hip ABduction/ADduction: Strengthening;15 reps Straight Leg Raises: AAROM;AROM;10 reps(5 warm up reps with AAROM, then 5x AROM) Knee Flexion: PROM;5 reps Goniometric ROM: 3-103 Other Exercises Other Exercises: Pt/caregiver educated on safe use of AE for LB ADL management.   Shoulder Instructions       General Comments      Pertinent Vitals/ Pain       Pain Assessment: Faces Pain Score: 8  Faces Pain Scale: Hurts whole lot Pain Location: Pt endorses significant RLE pain while at rest. Pain Descriptors / Indicators: Aching;Sore;Operative site guarding;Discomfort;Grimacing;Moaning Pain Intervention(s): Limited activity within patient's tolerance;Monitored during session;Repositioned;Utilized relaxation techniques  Home Living                                          Prior Functioning/Environment              Frequency  Min 2X/week        Progress Toward Goals  OT Goals(current goals can now be found in the care plan section)  Progress towards OT goals: Progressing toward goals  Acute Rehab OT Goals Patient Stated Goal: " Get to rehab so I can get better faster" OT Goal Formulation: With patient Time For Goal Achievement: 03/14/20 Potential to Achieve Goals: Good  Plan Discharge plan remains appropriate;Frequency needs to be updated    Co-evaluation                 AM-PAC OT "6 Clicks" Daily Activity     Outcome Measure   Help from another person eating meals?: None Help from another person taking care of personal grooming?: None Help from  another person toileting, which includes using toliet, bedpan, or urinal?: A Lot Help from another person bathing (including washing, rinsing, drying)?: Total Help from another person to put on and taking off regular upper body clothing?: A Lot Help from another person to put on and taking off regular lower body clothing?: A Lot 6 Click Score: 15    End of Session Equipment Utilized During Treatment: Gait belt  OT Visit Diagnosis: Muscle weakness (generalized) (M62.81);Other (comment)   Activity Tolerance Patient tolerated treatment well   Patient Left with family/visitor present;in chair;Other (comment)(With PT in room to begin session.)   Nurse Communication          Time: [id number] OT Time Calculation (min): 14 min  Charges: OT General Charges $OT Visit: 1 Visit OT Evaluation $OT Eval Moderate Complexity: 1 Mod OT Treatments $Self Care/Home Management : 8-22 mins  Shara Blazing, M.S., OTR/L Ascom: [phone] 03/06/20, 3:14 PM

## 2020-03-06 NOTE — TOC Progression Note (Signed)
Transition of Care [facility]) - Progression Note    Patient Details  Name: Ashlee Fields MRN: [id number] Date of Birth: 1956/02/28  [facility]) CM/SW Contact  Remberto Lienhard, Gardiner Rhyme, LCSW Phone Number: 03/06/2020, 12:09 PM  Clinical Narrative: Contacted kelly-CIR liaison to ask if have heard from insurance. She has not but hopeful will hear something today.      Expected Discharge Plan: [place] Barriers to Discharge: Continued Medical Work up  Expected Discharge Plan and Services Expected Discharge Plan: [place] In-house Referral: Clinical Social Work   Post Acute Care Choice: [place] Living arrangements for the past 2 months: Single Family Home                                       Social Determinants of Health (SDOH) Interventions    Readmission Risk Interventions No flowsheet data found.

## 2020-03-06 NOTE — Progress Notes (Signed)
Inpatient Rehabilitation-Admissions Coordinator   I still have not received a determination regarding pt's CIR request. I called her insurance company this morning and the case is still listed as pended.   Will update once I have heard a determination-Of note, I do not have a bed available for this patient today. TOC team aware.   Raechel Ache, OTR/L  Rehab Admissions Coordinator  [phone] 03/06/2020 10:13 AM

## 2020-03-06 NOTE — Progress Notes (Signed)
Physical Therapy Treatment Patient Details Name: Ashlee Fields MRN: [id number] DOB: 10-15-1956 Today's Date: 03/06/2020    History of Present Illness 64 y/o female s/p R total knee replacement.  Pt has MS with significant L sided involvement, h/o distant L TKA.    PT Comments    Pt tired from being up in recliner and t/o session had gradually increased lethargy which has been consistent with pain meds.  Pt continues to have desire to work with PT but showed poor overall tolerance to any activity this afternoon and ultimately was dependent for transfer back to bed, struggled with bed mobility and could not do her typical supine exercises routine (decreased reps and exercises) with needing increased time, encouragement.  Pt also found out the insurance had denied her for CIR and was disappointed and frustrated with the news.    Follow Up Recommendations  SNF(insurance denied CIR, needs STR)     Equipment Recommendations  Rolling walker with 5" wheels    Recommendations for Other Services       Precautions / Restrictions Precautions Precautions: Fall;Knee Precaution Booklet Issued: Yes (comment) Required Braces or Orthoses: Knee Immobilizer - Right Knee Immobilizer - Right: Other (comment)(Per notes, on while in bed and not working with PT) Restrictions Weight Bearing Restrictions: Yes RLE Weight Bearing: Weight bearing as tolerated    Mobility  Bed Mobility Overal bed mobility: Needs Assistance Bed Mobility: Sit to Supine     Supine to sit: Mod assist Sit to supine: Max assist   General bed mobility comments: Pt needed heavy assist to get to supine from sitting this afternoon.  Transfers Overall transfer level: Needs assistance Equipment used: Rolling walker (2 wheeled) Transfers: Sit to/from Stand Sit to Stand: Max assist Stand pivot transfers: Max assist       General transfer comment: Pt reports being too tired and "loopy" from pain meds to try standing, heavy  assist stand-pivot back to bed from recliner  Ambulation/Gait             General Gait Details: unable/unsafe this date   Stairs             Wheelchair Mobility    Modified Rankin (Stroke Patients Only)       Balance Overall balance assessment: Needs assistance Sitting-balance support: Single extremity supported Sitting balance-Leahy Scale: Fair Sitting balance - Comments: Pt able to briefly maintain unsupported sitting in recliner but generally was slumped with limited ability to self adjust     Standing balance-Leahy Scale: Zero Standing balance comment: heavy use of BUE for support, unable to attain upright/hips forward                            Cognition Arousal/Alertness: Lethargic Behavior During Therapy: WFL for tasks assessed/performed Overall Cognitive Status: Within Functional Limits for tasks assessed                                 General Comments: Pt with brief moments of good awareness but generally was very sleepy most of session      Exercises Total Joint Exercises Ankle Circles/Pumps: AROM;10 reps Quad Sets: Strengthening;15 reps Short Arc Quad: AAROM;AROM;10 reps Heel Slides: AROM;10 reps(with resisted leg extenions) Hip ABduction/ADduction: AAROM;AROM;10 reps Straight Leg Raises: AAROM;AROM;10 reps(5 warm up reps with AAROM, then 5x AROM) Knee Flexion: PROM;5 reps Goniometric ROM: 3-103 Other Exercises Other Exercises: Pt/caregiver educated  on safe use of AE for LB ADL management.    General Comments        Pertinent Vitals/Pain Pain Assessment: 0-10 Pain Score: 8  Faces Pain Scale: Hurts whole lot Pain Location: Pt endorses significant RLE pain while at rest. Pain Descriptors / Indicators: Aching;Sore;Operative site guarding;Discomfort;Grimacing;Moaning Pain Intervention(s): Limited activity within patient's tolerance;Monitored during session;Repositioned;Utilized relaxation techniques    Home  Living                      Prior Function            PT Goals (current goals can now be found in the care plan section) Acute Rehab PT Goals Patient Stated Goal: " Get to rehab so I can get better faster" PT Goal Formulation: With patient Time For Goal Achievement: 03/13/20 Potential to Achieve Goals: Good Progress towards PT goals: Progressing toward goals    Frequency    BID      PT Plan Current plan remains appropriate    Co-evaluation              AM-PAC PT "6 Clicks" Mobility   Outcome Measure  Help needed turning from your back to your side while in a flat bed without using bedrails?: A Little Help needed moving from lying on your back to sitting on the side of a flat bed without using bedrails?: A Lot Help needed moving to and from a bed to a chair (including a wheelchair)?: Total Help needed standing up from a chair using your arms (e.g., wheelchair or bedside chair)?: Total Help needed to walk in hospital room?: Total Help needed climbing 3-5 steps with a railing? : Total 6 Click Score: 9    End of Session Equipment Utilized During Treatment: Gait belt Activity Tolerance: Patient limited by pain;Patient limited by lethargy Patient left: in bed;with call bell/phone within reach;with bed alarm set Nurse Communication: Mobility status;Other (comment) PT Visit Diagnosis: Muscle weakness (generalized) (M62.81);Difficulty in walking, not elsewhere classified (R26.2);Pain Pain - Right/Left: Right Pain - part of body: Knee     Time: 5329-9242 PT Time Calculation (min) (ACUTE ONLY): 31 min  Charges:  $Therapeutic Exercise: 8-22 mins $Therapeutic Activity: 8-22 mins                     Kreg Shropshire, DPT 03/06/2020, 3:32 PM

## 2020-03-06 NOTE — Progress Notes (Signed)
  Subjective:  POD #8 s/p left TKA.   Patient reports left pain as mild.  Patient has more pain when she gets up with physical therapy.  She states that she did well today with physical therapy exercises.  She was able to get out of bed to a chair.  Objective:   VITALS:   Vitals:   03/05/20 1601 03/05/20 2334 03/06/20 0745 03/06/20 1610  BP: 123/72 128/76 133/78 111/66  Pulse: 86 84 79 95  Resp:  _0 Temp: 98.2 F (36.8 C) 99.1 F (37.3 C) 98.6 F (37 C) 98.4 F (36.9 C)  TempSrc: Oral Oral Oral Oral  SpO2: 93% 95% 97% 93%  Weight:      Height:        PHYSICAL EXAM: Right lower extremity:  Neurovascular intact Sensation intact distally Intact pulses distally Dorsiflexion/Plantar flexion intact Incision: dressing C/D/I No cellulitis present Compartment soft  LABS  Results for orders placed or performed during the hospital encounter of 02/28/20 (from the past 24 hour(s))  Glucose, capillary     Status: Abnormal   Collection Time: 03/05/20  9:22 PM  Result Value Ref Range   Glucose-Capillary 183 (H) 70 - 99 mg/dL   Comment 1 Notify RN   Glucose, capillary     Status: None   Collection Time: 03/06/20  7:30 AM  Result Value Ref Range   Glucose-Capillary 92 70 - 99 mg/dL   Comment 1 Notify RN   Glucose, capillary     Status: Abnormal   Collection Time: 03/06/20 11:47 AM  Result Value Ref Range   Glucose-Capillary 133 (H) 70 - 99 mg/dL   Comment 1 Notify RN   Glucose, capillary     Status: Abnormal   Collection Time: 03/06/20  4:41 PM  Result Value Ref Range   Glucose-Capillary 166 (H) 70 - 99 mg/dL   Comment 1 Notify RN     No results found.  Assessment/Plan: 7 Days Post-Op   Principal Problem:   S/P TKR (total knee replacement) using cement, right Active Problems:   MS (multiple sclerosis) (HCC)   COPD exacerbation (HCC)   Depression with anxiety   Diabetes mellitus without complication (HCC)   HTN, goal below 140/90   Hyperlipidemia,  unspecified   Hypothyroidism   GERD (gastroesophageal reflux disease)   Hematuria   Acute on chronic respiratory failure with hypoxia ([place])  Continue physical therapy.  Patient was denied CIR today by her health insurance.  Patient now being screened for skilled nursing facilities and will require insurance position.  Continue current pain management.  Patient will remain on Lovenox for DVT prophylaxis.   Thornton Park , MD 03/06/2020, 6:17 PM

## 2020-03-06 NOTE — Progress Notes (Signed)
Inpatient Rehabilitation-Admissions Coordinator   Was notified by pt's insurance company that her request for CIR has been denied. AC has notified the patient and she appears to be open to SNF placement. TOC team notified of CIR denial. AC will sign off. Please call if questions.   Raechel Ache, OTR/L  Rehab Admissions Coordinator  [phone] 03/06/2020 5:03 PM

## 2020-03-07 LAB — GLUCOSE, CAPILLARY
Glucose-Capillary: 107 mg/dL — ABNORMAL HIGH (ref 70–99)
Glucose-Capillary: 168 mg/dL — ABNORMAL HIGH (ref 70–99)
Glucose-Capillary: 209 mg/dL — ABNORMAL HIGH (ref 70–99)
Glucose-Capillary: 165 mg/dL — ABNORMAL HIGH (ref 70–99)

## 2020-03-07 MED ORDER — PHENDIMETRAZINE TARTRATE 35 MG PO TABS: 105.0000 mg | ORAL_TABLET | Freq: Every day | ORAL | Status: DC

## 2020-03-07 MED ORDER — PHENDIMETRAZINE TARTRATE 35 MG PO TABS
35.0000 mg | ORAL_TABLET | Freq: Three times a day (TID) | ORAL | Status: DC
  Administered 2020-03-07: 35 mg via ORAL
  Filled 2020-03-07 (×2): qty 1

## 2020-03-07 MED ORDER — PHENDIMETRAZINE TARTRATE 35 MG PO TABS
105.0000 mg | ORAL_TABLET | Freq: Every day | ORAL | Status: DC
  Administered 2020-03-08: 105 mg via ORAL
  Filled 2020-03-07 (×2): qty 3

## 2020-03-07 NOTE — TOC Progression Note (Signed)
Transition of Care [facility]) - Progression Note    Patient Details  Name: Ashlee Fields MRN: [id number] Date of Birth: [birth year]  Transition of Care [facility]) CM/SW Contact  Ashlee Fields, Ashlee Rhyme, LCSW Phone Number: 03/07/2020, 8:07 AM  Clinical Narrative:   Have spoken again to Ashlee Fields who reports they can take pt with her bringing her medications for MS. Their administrator has agreed to this. She started insurance auth for admission, hopefully will not take 3-4 days like [employer] usually takes. Pt aware and agreeable.    Expected Discharge Plan: [place] Barriers to Discharge: Continued Medical Work up  Expected Discharge Plan and Services Expected Discharge Plan: [place] In-house Referral: Clinical Social Work   Post Acute Care Choice: [place] Living arrangements for the past 2 months: Single Family Home                                       Social Determinants of Health (SDOH) Interventions    Readmission Risk Interventions No flowsheet data found.

## 2020-03-07 NOTE — Progress Notes (Signed)
Physical Therapy Treatment Patient Details Name: Ashlee Fields MRN: [id number] DOB: 1956-06-27 Today's Date: 03/07/2020    History of Present Illness 64 y/o female s/p R total knee replacement.  Pt has MS with significant L sided involvement, h/o distant L TKA.    PT Comments    Pt was seated in recliner upon arriving. She is very sleepy/lethargic and requesting to return to bed. Prior to return to bed, pt performed there ex seated in chair. AROM RLE 2-96 degrees after tolerating PROM/stretching 5 x hold 10 sec. She stood pivot from recliner to EOB with max assist + vcs. 2nd person close for safety. Max assist to progress BLEs back into bed form EOB sitting. She then performed there ex handout with less overall assistance required. Pt is progressing with PT. Therapist continues to recommend DC to SNF to address deficits and assist pt to PLOF. Pt was repositioned in bed with call bell in reach, knee immobilizer donned, polar care in place, and spouse at bedside. Acute PT will continue to follow per POC progressing as able.    Follow Up Recommendations  SNF     Equipment Recommendations  Rolling walker with 5" wheels    Recommendations for Other Services       Precautions / Restrictions Precautions Precautions: Fall;Knee Precaution Booklet Issued: Yes (comment) Precaution Comments: issued HEP Required Braces or Orthoses: Knee Immobilizer - Right Restrictions Weight Bearing Restrictions: Yes RLE Weight Bearing: Weight bearing as tolerated    Mobility  Bed Mobility Overal bed mobility: Needs Assistance Bed Mobility: Sit to Supine       Sit to supine: Max assist   General bed mobility comments: Max assist 2/2 to fatigue/weakness. BLEs assisted back into bed.  Transfers Overall transfer level: Needs assistance Equipment used: Rolling walker (2 wheeled) Transfers: Stand Pivot Transfers   Stand pivot transfers: Max assist;+2 safety/equipment       General transfer  comment: pt was able to stand pivot from recliner to EOB vwith max assist + vcs. 2nd person for safety only.  Ambulation/Gait             General Gait Details: unsafe to trial   Stairs             Wheelchair Mobility    Modified Rankin (Stroke Patients Only)       Balance Overall balance assessment: Needs assistance Sitting-balance support: Feet supported Sitting balance-Leahy Scale: Fair     Standing balance support: Bilateral upper extremity supported Standing balance-Leahy Scale: Poor Standing balance comment: heavy relys on BUE support to static stand                             Cognition Arousal/Alertness: Lethargic Behavior During Therapy: WFL for tasks assessed/performed Overall Cognitive Status: Within Functional Limits for tasks assessed                                 General Comments: Pt very sleepy this afternoon but cooperative. She required several times to be woken up once performing ther ex back in bed.      Exercises Total Joint Exercises Ankle Circles/Pumps: AROM;10 reps Quad Sets: Strengthening;15 reps Gluteal Sets: AROM;10 reps Short Arc Quad: AROM;10 reps Heel Slides: AROM;10 reps Hip ABduction/ADduction: AROM;10 reps Straight Leg Raises: AAROM;AROM;10 reps Knee Flexion: AAROM;AROM;5 reps;Seated(towel slides) Goniometric ROM: 2-96    General Comments  Pertinent Vitals/Pain Pain Assessment: No/denies pain Pain Score: 0-No pain Faces Pain Scale: No hurt Pain Intervention(s): Limited activity within patient's tolerance;Monitored during session;Premedicated before session;Repositioned;Ice applied    Home Living                      Prior Function            PT Goals (current goals can now be found in the care plan section) Acute Rehab PT Goals Patient Stated Goal: " Get to rehab so I can get better faster" Progress towards PT goals: Progressing toward goals    Frequency     BID      PT Plan Current plan remains appropriate    Co-evaluation              AM-PAC PT "6 Clicks" Mobility   Outcome Measure  Help needed turning from your back to your side while in a flat bed without using bedrails?: A Little Help needed moving from lying on your back to sitting on the side of a flat bed without using bedrails?: A Lot Help needed moving to and from a bed to a chair (including a wheelchair)?: A Lot Help needed standing up from a chair using your arms (e.g., wheelchair or bedside chair)?: Total Help needed to walk in hospital room?: Total Help needed climbing 3-5 steps with a railing? : Total 6 Click Score: 10    End of Session Equipment Utilized During Treatment: Gait belt Activity Tolerance: Patient limited by lethargy Patient left: in bed;with call bell/phone within reach;with bed alarm set;with family/visitor present Nurse Communication: Mobility status PT Visit Diagnosis: Muscle weakness (generalized) (M62.81);Difficulty in walking, not elsewhere classified (R26.2);Pain Pain - Right/Left: Right Pain - part of body: Knee     Time: 8588-5027 PT Time Calculation (min) (ACUTE ONLY): 24 min  Charges:  $Therapeutic Exercise: 8-22 mins $Therapeutic Activity: 8-22 mins                     Julaine Fusi PTA 03/07/20, 3:06 PM

## 2020-03-07 NOTE — Plan of Care (Signed)
  Problem: Education: Goal: Knowledge of General Education information will improve Description: Including pain rating scale, medication(s)/side effects and non-pharmacologic comfort measures Outcome: Progressing   Problem: Clinical Measurements: Goal: Ability to maintain clinical measurements within normal limits will improve Outcome: Progressing Goal: Respiratory complications will improve Outcome: Progressing

## 2020-03-07 NOTE — Progress Notes (Signed)
  Subjective:  POD #9 s/p right total knee arthroplasty.   Patient reports right knee pain as mild.  Patient states she made good progress with physical therapy today.  Patient's husband is at the bedside.  Objective:   VITALS:   Vitals:   03/06/20 0745 03/06/20 1610 03/06/20 2324 03/07/20 0732  BP: 133/78 111/66 110/60 (!) 104/56  Pulse: 79 95 80 (!) 102  Resp: _0 Temp: 98.6 F (37 C) 98.4 F (36.9 C) 98.7 F (37.1 C) 99.2 F (37.3 C)  TempSrc: Oral Oral Oral Oral  SpO2: 97% 93% 99% 91%  Weight:      Height:        PHYSICAL EXAM: Right lower extremity: Neurovascular intact Sensation intact distally Intact pulses distally Dorsiflexion/Plantar flexion intact Incision: dressing C/D/I No cellulitis present Compartment soft  LABS  Results for orders placed or performed during the hospital encounter of 02/28/20 (from the past 24 hour(s))  Glucose, capillary     Status: Abnormal   Collection Time: 03/06/20  4:41 PM  Result Value Ref Range   Glucose-Capillary 166 (H) 70 - 99 mg/dL   Comment 1 Notify RN   Glucose, capillary     Status: Abnormal   Collection Time: 03/06/20  9:17 PM  Result Value Ref Range   Glucose-Capillary 151 (H) 70 - 99 mg/dL   Comment 1 Notify RN   Glucose, capillary     Status: Abnormal   Collection Time: 03/07/20  7:27 AM  Result Value Ref Range   Glucose-Capillary 107 (H) 70 - 99 mg/dL   Comment 1 Notify RN   Glucose, capillary     Status: Abnormal   Collection Time: 03/07/20 11:47 AM  Result Value Ref Range   Glucose-Capillary 168 (H) 70 - 99 mg/dL   Comment 1 Notify RN     No results found.  Assessment/Plan: 8 Days Post-Op   Principal Problem:   S/P TKR (total knee replacement) using cement, right Active Problems:   MS (multiple sclerosis) (HCC)   COPD exacerbation (HCC)   Depression with anxiety   Diabetes mellitus without complication (HCC)   HTN, goal below 140/90   Hyperlipidemia, unspecified   Hypothyroidism  GERD (gastroesophageal reflux disease)   Hematuria   Acute on chronic respiratory failure with hypoxia (HCC)  Continue PT. Continue Lovenox for DVT prophylaxis. Still awaiting insurance approval for skilled nursing facility.   Thornton Park , MD 03/07/2020, 2:10 PM

## 2020-03-07 NOTE — Progress Notes (Signed)
Physical Therapy Treatment Patient Details Name: Ashlee Fields MRN: [id number] DOB: 14-Feb-1956 Today's Date: 03/07/2020    History of Present Illness 64 y/o female s/p R total knee replacement.  Pt has MS with significant L sided involvement, h/o distant L TKA.    PT Comments    Pt was awake in long sitting upon arriving with R knee immobilizer donned. She is however was very lethargic throughout session. She required mod assist to exit R side of bed. Sat EOB with CGA for safety prior to standing 3 x to RW with max assist from elevated bed height. Therapist applied L AFO/ BLE shoes in sitting prior to standing. Pt was able to stand ~ 1-2 minute each trial. She was able to lift and clear floor with RLE but unable to clear floor in standing with LLE. Extended rest between trials. She stood pivot to recliner from EOB with max assist of one with pt BUE support by therapist. She was repositioned in recliner at conclusion of session with spouse at bedside and polar care reapplied. Overall tolerated session well but continues to be limited by fatigue and lethargy. Therapist will return later this date to continue to progress pt as able. Therapist recommends DC to SNF when medically stable to address deficits with strength, ROM, and safe functional mobility.     Follow Up Recommendations  SNF     Equipment Recommendations  Rolling walker with 5" wheels    Recommendations for Other Services       Precautions / Restrictions Precautions Precautions: Fall;Knee Precaution Booklet Issued: Yes (comment) Required Braces or Orthoses: Knee Immobilizer - Right Restrictions Weight Bearing Restrictions: Yes RLE Weight Bearing: Weight bearing as tolerated    Mobility  Bed Mobility Overal bed mobility: Needs Assistance Bed Mobility: Supine to Sit     Supine to sit: Mod assist     General bed mobility comments: pt required increased time and mod assist to safely progress from supine (HOB  elevated) to short sit. Therapist applied L AFO/shoes in sitting. Knee immobilizer removed prior to exiting bed.  Transfers Overall transfer level: Needs assistance Equipment used: Rolling walker (2 wheeled) Transfers: Sit to/from Stand Sit to Stand: Max assist Stand pivot transfers: Max assist       General transfer comment: pt performed STS 3 x EOB prior to stand pivot to recliner. stand pivot without use of AD but BUE support on therapist. 3 x EOB STS to RW with max assist of one. Elevated bed height throughout.  Ambulation/Gait             General Gait Details: unable to progress/ clear LLE off floor in standing with BUE support.   Stairs             Wheelchair Mobility    Modified Rankin (Stroke Patients Only)       Balance                                            Cognition Arousal/Alertness: Lethargic Behavior During Therapy: WFL for tasks assessed/performed Overall Cognitive Status: Within Functional Limits for tasks assessed                                 General Comments: Pt with brief moments of good awareness but generally was very sleepy most  of session      Exercises      General Comments        Pertinent Vitals/Pain Pain Assessment: 0-10 Pain Score: 10-Worst pain ever Pain Location: Pt endorses significant RLE pain while at rest. Pain Descriptors / Indicators: Aching;Sore;Operative site guarding;Discomfort;Grimacing;Moaning Pain Intervention(s): Limited activity within patient's tolerance;Monitored during session;Premedicated before session;Repositioned;Ice applied    Home Living                      Prior Function            PT Goals (current goals can now be found in the care plan section) Acute Rehab PT Goals Patient Stated Goal: " Get to rehab so I can get better faster" Progress towards PT goals: Progressing toward goals    Frequency    BID      PT Plan Current plan  remains appropriate    Co-evaluation              AM-PAC PT "6 Clicks" Mobility   Outcome Measure  Help needed turning from your back to your side while in a flat bed without using bedrails?: A Little Help needed moving from lying on your back to sitting on the side of a flat bed without using bedrails?: A Lot Help needed moving to and from a bed to a chair (including a wheelchair)?: A Lot Help needed standing up from a chair using your arms (e.g., wheelchair or bedside chair)?: Total Help needed to walk in hospital room?: Total Help needed climbing 3-5 steps with a railing? : Total 6 Click Score: 10    End of Session Equipment Utilized During Treatment: Gait belt Activity Tolerance: Patient tolerated treatment well;Patient limited by lethargy Patient left: in chair;with chair alarm set;with call bell/phone within reach;with family/visitor present Nurse Communication: Mobility status PT Visit Diagnosis: Muscle weakness (generalized) (M62.81);Difficulty in walking, not elsewhere classified (R26.2);Pain Pain - Right/Left: Right Pain - part of body: Knee     Time: 2074-0979 PT Time Calculation (min) (ACUTE ONLY): 23 min  Charges:  $Therapeutic Activity: 23-37 mins                     Julaine Fusi PTA 03/07/20, 11:39 AM

## 2020-03-08 LAB — GLUCOSE, CAPILLARY
Glucose-Capillary: 114 mg/dL — ABNORMAL HIGH (ref 70–99)
Glucose-Capillary: 177 mg/dL — ABNORMAL HIGH (ref 70–99)
Glucose-Capillary: 183 mg/dL — ABNORMAL HIGH (ref 70–99)
Glucose-Capillary: 170 mg/dL — ABNORMAL HIGH (ref 70–99)

## 2020-03-08 NOTE — Progress Notes (Signed)
Physical Therapy Treatment Patient Details Name: Ashlee Fields MRN: [id number] DOB: February 20, 1956 Today's Date: 03/08/2020    History of Present Illness 64 y/o female s/p R total knee replacement.  Pt has MS with significant L sided involvement, h/o distant L TKA.    PT Comments    Pt had her best PT session in a while this date walking ~6 ft and showing increased confidence and tolerance with resisted AROM exercises.   Pt still continues to be limited with mobiltiy, ambulation, etc per typical post-op total knees but has continued to show good effort and determination despite slow progress.  Pt still needing +2 assist for standing/gait but again did better than most previous sessions this date.  Follow Up Recommendations  SNF     Equipment Recommendations  Rolling walker with 5" wheels    Recommendations for Other Services       Precautions / Restrictions Precautions Precautions: Fall;Knee Restrictions RLE Weight Bearing: Weight bearing as tolerated    Mobility  Bed Mobility Overal bed mobility: Needs Assistance Bed Mobility: Sit to Supine     Supine to sit: Mod assist     General bed mobility comments: Pt again with lean to the R initially in sitting, improves with adjustments from PT  Transfers Overall transfer level: Needs assistance Equipment used: Rolling walker (2 wheeled) Transfers: Sit to/from Stand Sit to Stand: +2 physical assistance;Max assist         General transfer comment: Pt with typical difficulty getting hips forward to be able to attain full standing but with heavy assist did end up able to maitnain standing with only CGA on a few occsaions, generally min/mod.  Ambulation/Gait Ambulation/Gait assistance: Mod assist;+2 physical assistance Gait Distance (Feet): 6 Feet Assistive device: Rolling walker (2 wheeled)       General Gait Details: Pt with bed bout of ambulation in a while.  Still needing constant assist (generally mod, some direct PT  hip pressure to keep her hips forward).  she was able to lift and move each foot multiple steps on each side, though generally struggled with L foot initiation and clearance.  Pt fatigued with the effort but thrilled she was able to do some walking today.   Stairs             Wheelchair Mobility    Modified Rankin (Stroke Patients Only)       Balance Overall balance assessment: Needs assistance Sitting-balance support: Feet supported Sitting balance-Leahy Scale: Fair Sitting balance - Comments: pt leaning to the R much of the time - able to make some adjustments and did not need constant assist.     Standing balance-Leahy Scale: Poor Standing balance comment: inconsistent ability to use UEs to support, constant cuing to keep hips forward, needs very close and usually significant direct assist                            Cognition Arousal/Alertness: Suspect due to medications Behavior During Therapy: WFL for tasks assessed/performed Overall Cognitive Status: Within Functional Limits for tasks assessed                                        Exercises Total Joint Exercises Ankle Circles/Pumps: AROM;10 reps Heel Slides: Strengthening;AROM;10 reps(resisted leg extensions) Hip ABduction/ADduction: AROM;10 reps;Strengthening Knee Flexion: 5 reps;Seated;PROM;AROM Goniometric ROM: 90 AROM, 98 PROM, still  lacking 2-3 deg from full TKE     General Comments        Pertinent Vitals/Pain Pain Assessment: 0-10 Pain Score: 3  Pain Location: per usual after meds, minimal pain at rest that increases with any R knee activity    Home Living                      Prior Function            PT Goals (current goals can now be found in the care plan section) Progress towards PT goals: Progressing toward goals    Frequency    BID      PT Plan Current plan remains appropriate    Co-evaluation              AM-PAC PT "6 Clicks"  Mobility   Outcome Measure  Help needed turning from your back to your side while in a flat bed without using bedrails?: A Little Help needed moving from lying on your back to sitting on the side of a flat bed without using bedrails?: A Lot Help needed moving to and from a bed to a chair (including a wheelchair)?: A Lot Help needed standing up from a chair using your arms (e.g., wheelchair or bedside chair)?: Total Help needed to walk in hospital room?: Total Help needed climbing 3-5 steps with a railing? : Total 6 Click Score: 10    End of Session Equipment Utilized During Treatment: Gait belt Activity Tolerance: Patient tolerated treatment well Patient left: with chair alarm set;with call bell/phone within reach Nurse Communication: Mobility status PT Visit Diagnosis: Muscle weakness (generalized) (M62.81);Difficulty in walking, not elsewhere classified (R26.2);Pain Pain - Right/Left: Right Pain - part of body: Knee     Time: 1100-1143 PT Time Calculation (min) (ACUTE ONLY): 43 min  Charges:  $Gait Training: 8-22 mins $Therapeutic Exercise: 8-22 mins $Therapeutic Activity: 8-22 mins                     Kreg Shropshire, DPT 03/08/2020, 2:30 PM

## 2020-03-08 NOTE — TOC Progression Note (Addendum)
Transition of Care [facility]) - Progression Note    Patient Details  Name: Ashlee Fields MRN: [id number] Date of Birth: 1956/09/13  Transition of Care [facility]) CM/SW Contact  Valetta Mulroy, Gardiner Rhyme, LCSW Phone Number: 03/08/2020, 8:55 AM  Clinical Narrative:   Jonnie Kind to check if has received insurance auth. Awaiting return call  1:59 PM Nothing as of yet according to [facility]  3:44 pm [name] reports no news regarding insurance auth   Expected Discharge Plan: [place] Barriers to Discharge: Continued Medical Work up  Expected Discharge Plan and Services Expected Discharge Plan: [place] In-house Referral: Clinical Social Work   Post Acute Care Choice: [place] Living arrangements for the past 2 months: Single Family Home                                       Social Determinants of Health (SDOH) Interventions    Readmission Risk Interventions No flowsheet data found.

## 2020-03-08 NOTE — Care Management Important Message (Signed)
Important Message  Patient Details  Name: Ashlee Fields MRN: [id number] Date of Birth: 07-20-1956   Medicare Important Message Given:  Yes     [name] 03/08/2020, 10:49 AM

## 2020-03-08 NOTE — Progress Notes (Signed)
Physical Therapy Treatment Patient Details Name: Ashlee Fields MRN: [id number] DOB: May 30, 1956 Today's Date: 03/08/2020    History of Present Illness 64 y/o female s/p R total knee replacement.  Pt has MS with significant L sided involvement, h/o distant L TKA.    PT Comments    Pt continues to have good attitude and willingness to work eith PT but continues to be functionally quite limited. However, she did have, a considerably better day (especially AM session) than most of the previous sessions since surgery.  Pt struggled to advance LEs much this afternoon, but was able to do some shuffling.   Follow Up Recommendations  SNF     Equipment Recommendations  Rolling walker with 5" wheels    Recommendations for Other Services       Precautions / Restrictions Precautions Precautions: Fall;Knee Restrictions RLE Weight Bearing: Weight bearing as tolerated    Mobility  Bed Mobility Overal bed mobility: Needs Assistance Bed Mobility: Sit to Supine     Supine to sit: Mod assist Sit to supine: Mod assist;Max assist   General bed mobility comments: Pt again with lean to the R initially in sitting, improves with adjustments from PT  Transfers Overall transfer level: Needs assistance Equipment used: Rolling walker (2 wheeled) Transfers: Sit to/from Stand Sit to Stand: Mod assist;+2 physical assistance;Max assist         General transfer comment: Heavy assist to initially get hips forward (despite much cuing pre and during transition)  Ambulation/Gait Ambulation/Gait assistance: Mod assist;+2 physical assistance Gait Distance (Feet): 3 Feet Assistive device: Rolling walker (2 wheeled)       General Gait Details: Pt less able to move feet voluntarily this afternoon, but with some direct assist was able to to maintain standing/WBing enough to shuffle back to bed from reclienr   Stairs             Wheelchair Mobility    Modified Rankin (Stroke Patients  Only)       Balance Overall balance assessment: Needs assistance Sitting-balance support: Feet supported Sitting balance-Leahy Scale: Fair Sitting balance - Comments: leaning to the R, more so than this AM     Standing balance-Leahy Scale: Poor Standing balance comment: constant assist to stay upright, keep hips from moving back - though she did manage to maintain balance w/o direct assist briefly from time to time                            Cognition Arousal/Alertness: Lethargic;Suspect due to medications Behavior During Therapy: WFL for tasks assessed/performed Overall Cognitive Status: Within Functional Limits for tasks assessed                                        Exercises Total Joint Exercises Ankle Circles/Pumps: AROM;10 reps Quad Sets: Strengthening;15 reps Short Arc Quad: AROM;10 reps(AAROM for TKE) Heel Slides: Strengthening;AROM;10 reps(resisted leg extensions) Hip ABduction/ADduction: Strengthening;15 reps Straight Leg Raises: AROM;10 reps Long Arc Quad: AROM;10 reps Knee Flexion: PROM;5 reps Goniometric ROM: 90 AROM, 98 PROM, still lacking 2-3 deg from full TKE     General Comments        Pertinent Vitals/Pain Pain Assessment: 0-10 Pain Score: 5  Pain Location: per usual after meds, minimal pain at rest that increases with any R knee activity    Home Living  Prior Function            PT Goals (current goals can now be found in the care plan section) Progress towards PT goals: Progressing toward goals    Frequency    BID      PT Plan Current plan remains appropriate    Co-evaluation              AM-PAC PT "6 Clicks" Mobility   Outcome Measure  Help needed turning from your back to your side while in a flat bed without using bedrails?: A Little Help needed moving from lying on your back to sitting on the side of a flat bed without using bedrails?: A Lot Help needed moving to  and from a bed to a chair (including a wheelchair)?: A Lot Help needed standing up from a chair using your arms (e.g., wheelchair or bedside chair)?: Total Help needed to walk in hospital room?: Total Help needed climbing 3-5 steps with a railing? : Total 6 Click Score: 10    End of Session Equipment Utilized During Treatment: Gait belt Activity Tolerance: Patient tolerated treatment well Patient left: with chair alarm set;with bed alarm set Nurse Communication: Mobility status PT Visit Diagnosis: Muscle weakness (generalized) (M62.81);Difficulty in walking, not elsewhere classified (R26.2);Pain Pain - Right/Left: Right Pain - part of body: Knee     Time: 4193-7902 PT Time Calculation (min) (ACUTE ONLY): 41 min  Charges:  $Gait Training: 8-22 mins $Therapeutic Exercise: 8-22 mins $Therapeutic Activity: 8-22 mins                     Laelyn Blumenthal R Shannon Kirkendall, dPT 03/08/2020, 5:22 PM

## 2020-03-08 NOTE — Progress Notes (Signed)
  Subjective:  POD #10 s/p right TKA.   Patient reports right knee pain as mild to moderate.  Patient states she continues to make progress with PT.  Objective:   VITALS:   Vitals:   03/07/20 1611 03/07/20 2103 03/08/20 0136 03/08/20 0744  BP: 120/63 (!) 115/56 126/65 128/66  Pulse: 77 88 84 75  Resp: _0 Temp: 99 F (37.2 C) 98.4 F (36.9 C) 98.4 F (36.9 C) 98 F (36.7 C)  TempSrc: Oral Oral  Oral  SpO2: 96% 94% 93% 95%  Weight:      Height:        PHYSICAL EXAM: Right lower extremity Neurovascular intact Sensation intact distally Intact pulses distally Dorsiflexion/Plantar flexion intact Incision: dressing C/D/I No cellulitis present Compartment soft  LABS  Results for orders placed or performed during the hospital encounter of 02/28/20 (from the past 24 hour(s))  Glucose, capillary     Status: Abnormal   Collection Time: 03/07/20  4:51 PM  Result Value Ref Range   Glucose-Capillary 209 (H) 70 - 99 mg/dL   Comment 1 Notify RN   Glucose, capillary     Status: Abnormal   Collection Time: 03/07/20  9:57 PM  Result Value Ref Range   Glucose-Capillary 165 (H) 70 - 99 mg/dL   Comment 1 Notify RN   Glucose, capillary     Status: Abnormal   Collection Time: 03/08/20  7:46 AM  Result Value Ref Range   Glucose-Capillary 114 (H) 70 - 99 mg/dL  Glucose, capillary     Status: Abnormal   Collection Time: 03/08/20 11:53 AM  Result Value Ref Range   Glucose-Capillary 177 (H) 70 - 99 mg/dL    No results found.  Assessment/Plan: 9 Days Post-Op   Principal Problem:   S/P TKR (total knee replacement) using cement, right Active Problems:   MS (multiple sclerosis) (HCC)   COPD exacerbation (HCC)   Depression with anxiety   Diabetes mellitus without complication (HCC)   HTN, goal below 140/90   Hyperlipidemia, unspecified   Hypothyroidism   GERD (gastroesophageal reflux disease)   Hematuria   Acute on chronic respiratory failure with hypoxia  ([place])  Patient is requesting to d/c knee immobilizer at night.  Order has been given to allow her to stop wearing it to help her sleep.  Continue PT.  Still waiting for insurance approval for SNF.    Thornton Park , MD 03/08/2020, 4:22 PM

## 2020-03-09 LAB — GLUCOSE, CAPILLARY
Glucose-Capillary: 119 mg/dL — ABNORMAL HIGH (ref 70–99)
Glucose-Capillary: 241 mg/dL — ABNORMAL HIGH (ref 70–99)
Glucose-Capillary: 171 mg/dL — ABNORMAL HIGH (ref 70–99)
Glucose-Capillary: 187 mg/dL — ABNORMAL HIGH (ref 70–99)

## 2020-03-09 LAB — RESPIRATORY PANEL BY RT PCR (FLU A&B, COVID)
Influenza A by PCR: NEGATIVE
Influenza B by PCR: NEGATIVE
SARS Coronavirus 2 by RT PCR: NEGATIVE

## 2020-03-09 MED ORDER — DOCUSATE SODIUM 100 MG PO CAPS: 100.0000 mg | ORAL_CAPSULE | Freq: Two times a day (BID) | ORAL | 0 refills | Status: DC

## 2020-03-09 MED ORDER — ACETAMINOPHEN 500 MG PO TABS: 1000.0000 mg | ORAL_TABLET | Freq: Three times a day (TID) | ORAL | 0 refills | Status: DC

## 2020-03-09 MED ORDER — BISACODYL 10 MG RE SUPP: 10.0000 mg | Freq: Every day | RECTAL | 0 refills | Status: DC | PRN

## 2020-03-09 MED ORDER — ENOXAPARIN SODIUM 40 MG/0.4ML ~~LOC~~ SOLN: 40.0000 mg | SUBCUTANEOUS | 0 refills | Status: DC

## 2020-03-09 MED ORDER — OXYCODONE HCL 5 MG PO TABS: 5.0000 mg | ORAL_TABLET | ORAL | 0 refills | Status: DC | PRN

## 2020-03-09 NOTE — TOC Transition Note (Signed)
Transition of Care [facility]) - CM/SW Discharge Note   Patient Details  Name: Ashlee Fields MRN: [id number] Date of Birth: September 12, 1956  Transition of Care [facility]) CM/SW Contact:  Elease Hashimoto, LCSW Phone Number: 03/09/2020, 4:09 PM   Clinical Narrative:  Marjie Skiff has contacted [facility] to try to push through approval. Tammy will call bedside RN to let know approval and she will call EMS for transport. Packet in chart.    12:00 Insurance auth finally received. Pt and husband aware and pleased. MD to amend DC summary and will get EMS contacted for transfer today. Going to room 601. EMS called will be here in the next 30 minutes  Final next level of care: [place] Barriers to Discharge: Barriers Resolved   Patient Goals and CMS Choice Patient states their goals for this hospitalization and ongoing recovery are:: I want to go to rehab at [facility].gov Compare Post Acute Care list provided to:: Patient Choice offered to / list presented to : Patient, Spouse  Discharge Placement PASRR number recieved: 03/01/20            [facility] at: [facility] [place] Patient to be transferred to facility by: EMS Name of family member notified: Husband Patient and family notified of of transfer: 03/09/20  Discharge Plan and Services In-house Referral: Clinical Social Work   Post Acute Care Choice: [place]                               Social Determinants of Health (SDOH) Interventions     Readmission Risk Interventions No flowsheet data found.

## 2020-03-09 NOTE — TOC Progression Note (Addendum)
Transition of Care [facility]) - Progression Note    Patient Details  Name: KANYON BUNN MRN: [id number] Date of Birth: 11/19/1956  Transition [facility]) CM/SW Contact  Supriya Beaston, Gardiner Rhyme, LCSW Phone Number: 03/09/2020, 4:48 PM  Clinical Narrative: Now [facility] is talking to the [facility] [phone] to try to get an answer and approval. This worker has also called and left a voice mail message for her. Pt and husband aware of the situation and awaiting confirmation of coverage. Bedside RN aware and pt has had a BM now and is ready as long as coverage.  4:58 PM [name] will still work on this and if gets approval will call bedside RN_Lauren to let know and she will call EMS for transport to facility. Packet in chart. Pt aware and of this plan.  Expected Discharge Plan: [place] Barriers to Discharge: Barriers Resolved  Expected Discharge Plan and Services Expected Discharge Plan: [place] In-house Referral: Clinical Social Work   Post Acute Care Choice: [place] Living arrangements for the past 2 months: Single Family Home Expected Discharge Date: 03/09/20                                     Social Determinants of Health (SDOH) Interventions    Readmission Risk Interventions No flowsheet data found.

## 2020-03-09 NOTE — Discharge Summary (Addendum)
Physician Discharge Summary  Patient ID: Ashlee Fields MRN: [id number] DOB/AGE: December 10, 1955 64 y.o.  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 Anxiety   . Arthritis   . Depression   . Diabetes mellitus without complication ([place])   . GERD (gastroesophageal reflux disease)   . Hypertension   . Hypothyroidism   . Multiple sclerosis ([place]) 1980  . Osteoporosis   . Sleep apnea   . Vision abnormalities     Surgeries: Procedure(s): TOTAL KNEE ARTHROPLASTY on 02/28/2020   Consultants (if any): Treatment Team:  Hollice Espy, MD  Discharged Condition: Improved  Hospital Course: Ashlee Fields is an 64 y.o. 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    She was given perioperative antibiotics:  Anti-infectives (From admission, onward)   Start     Dose/Rate Route Frequency Ordered Stop   03/02/20 1000  azithromycin (ZITHROMAX) tablet 250 mg     250 mg Oral Daily 03/01/20 1600 03/05/20 0956   03/01/20 1700  azithromycin (ZITHROMAX) tablet 500 mg     500 mg Oral Daily 03/01/20 1600 03/01/20 1712   02/29/20  2200  doxycycline (VIBRA-TABS) tablet 100 mg  Status:  Discontinued     100 mg Oral Every 12 hours 02/29/20 1713 03/01/20 1600   02/28/20 1430  ceFAZolin (ANCEF) IVPB 1 g/50 mL premix     1 g 100 mL/hr over 30 Minutes Intravenous Every 6 hours 02/28/20 1335 02/28/20 2143   02/28/20 0737  ceFAZolin (ANCEF) 2-4 GM/100ML-% IVPB    Note to Pharmacy: Myles Lipps   : cabinet override      02/28/20 0737 02/28/20 0830   02/28/20 0631  clindamycin (CLEOCIN) 600 MG/50ML IVPB    Note to Pharmacy: Myles Lipps   : cabinet override      02/28/20 0631 02/28/20 0831   02/28/20 0615  ceFAZolin (ANCEF) IVPB 2g/100 mL premix     2 g 200 mL/hr over 30 Minutes Intravenous On call to O.R. 02/28/20 0612 02/28/20 0825   02/28/20 0030  clindamycin (CLEOCIN) IVPB 600 mg     600 mg 100 mL/hr over 30 Minutes Intravenous  Once 02/28/20 0020 02/28/20 0840    .  She was given sequential compression devices, early ambulation, and lovenox for DVT prophylaxis.  Patient had consult for hypotension and possible bronchitis by the hospitalist service.  She was treated with a z-pack.  Patient's prolonged hospital stay was otherwise due to waiting for insurance approval for rehab.  She benefited maximally from the hospital stay and there were no complications.    Recent vital signs:  Vitals:   03/09/20 0011 03/09/20 0806  BP: 109/67 133/77  Pulse: 88 93  Resp: 17  16  Temp: 98.3 F (36.8 C) 98.4 F (36.9 C)  SpO2: 98% 100%    Recent laboratory studies:  Lab Results  Component Value Date   HGB 10.0 (L) 03/01/2020   HGB 10.0 (L) 02/29/2020   HGB 13.3 10/07/2016   Lab Results  Component Value Date   WBC 11.6 (H) 03/01/2020   PLT 333 03/01/2020   Lab Results  Component Value Date   INR 0.9 02/28/2020   Lab Results  Component Value Date   NA 141 02/29/2020   K 3.9 02/29/2020   CL 103 02/29/2020   CO2 28 02/29/2020   BUN 13 02/29/2020   CREATININE 0.63 02/29/2020   GLUCOSE 136 (H) 02/29/2020     Discharge Medications:   Allergies as of 03/09/2020      Reactions   Penicillins Hives   Did it involve swelling of the face/tongue/throat, SOB, or low BP? No Did it involve sudden or severe rash/hives, skin peeling, or any reaction on the inside of your mouth or nose? Yes Did you need to seek medical attention at a hospital or doctor's office? Unknown When did it last happen?20 + years If all above answers are "NO", may proceed with cephalosporin use.   Sulfa Antibiotics Hives, Nausea And Vomiting   Ibuprofen Rash      Medication List    STOP taking these medications   traMADol 50 MG tablet Commonly known as: ULTRAM     TAKE these medications   acetaminophen 500 MG tablet Commonly known as: TYLENOL Take 2 tablets (1,000 mg total) by mouth 3 (three) times daily. What changed: how much to take   alendronate 70 MG tablet Commonly known as: FOSAMAX Take 70 mg by mouth once a week. Take with a full glass of water on an empty stomach.  sunday   atorvastatin 80 MG tablet Commonly known as: LIPITOR Take 80 mg by mouth at bedtime.   baclofen 20 MG tablet Commonly known as: LIORESAL Take 40 mg by mouth at bedtime.   bisacodyl 10 MG suppository Commonly known as: DULCOLAX Place 1 suppository (10 mg total) rectally daily as needed for moderate constipation.   clindamycin 150 MG capsule Commonly known as: CLEOCIN Take 600 mg by mouth See admin instructions. Take 600 mg 1 hour prior to dental work   cyanocobalamin 1000 MCG/ML injection Commonly known as: (VITAMIN B-12) Inject 1,000 mcg into the muscle every 30 (thirty) days.   diazepam 2 MG tablet Commonly known as: VALIUM Take 2 mg by mouth 4 (four) times daily as needed for muscle spasms.   docusate sodium 100 MG capsule Commonly known as: COLACE Take 1 capsule (100 mg total) by mouth 2 (two) times daily.   enoxaparin 40 MG/0.4ML injection Commonly known as: LOVENOX Inject 0.4 mLs (40 mg total) into the  skin daily for 14 days. Start taking on: March 10, 2020   FLUoxetine 20 MG capsule Commonly known as: PROZAC Take 60 mg by mouth daily.   hydrochlorothiazide 25 MG tablet Commonly known as: HYDRODIURIL Take 25 mg by mouth daily.   levothyroxine 50 MCG tablet Commonly known as: SYNTHROID Take 50 mcg by mouth daily before breakfast.   LORazepam 1 MG tablet Commonly known as: ATIVAN Take 1 mg by mouth 2 (two) times daily.   metFORMIN 500 MG 24 hr tablet Commonly known as: GLUCOPHAGE-XR Take 1,500 mg by mouth at bedtime.   multivitamin with minerals Tabs tablet Take 1 tablet by mouth at bedtime. GERITOL COMPLETE MULTIVITAMIN.  Myrbetriq 50 MG Tb24 tablet Generic drug: mirabegron ER Take 50 mg by mouth daily.   ondansetron 4 MG disintegrating tablet Commonly known as: ZOFRAN-ODT Take 4 mg by mouth every 8 (eight) hours as needed for nausea/vomiting.   oxyCODONE 5 MG immediate release tablet Commonly known as: Oxy IR/ROXICODONE Take 1-2 tablets (5-10 mg total) by mouth every 4 (four) hours as needed for moderate pain (pain score 4-6).   Ozempic (0.25 or 0.5 MG/DOSE) 2 MG/1.5ML Sopn Generic drug: Semaglutide(0.25 or 0.5MG/DOS) Inject 0.25 mg into the skin every Sunday.   pantoprazole 40 MG tablet Commonly known as: PROTONIX Take 40 mg by mouth daily.   Phendimetrazine Tartrate 35 MG Tabs Take 105 mg by mouth daily.   predniSONE 10 MG tablet Commonly known as: DELTASONE Take 10 mg by mouth daily with breakfast.   Tecfidera 240 MG Cpdr Generic drug: Dimethyl Fumarate Take 240 mg by mouth in the morning and at bedtime.   valsartan-hydrochlorothiazide 320-12.5 MG tablet Commonly known as: DIOVAN-HCT Take 1 tablet by mouth daily.       Diagnostic Studies: US RENAL  Result Date: 02/24/2020 CLINICAL DATA:  Acute renal failure. EXAM: RENAL / URINARY TRACT ULTRASOUND COMPLETE COMPARISON:  12/21/2018. FINDINGS: Right Kidney: Renal measurements: 11.6 x 4.0 x 5.7 cm =  volume: 138.0 mL . Echogenicity within normal limits. No mass or hydronephrosis visualized. Left Kidney: Renal measurements: 12.0 x 5.4 x 5.4 cm = volume: 184.2 mL. Echogenicity within normal limits. No mass or hydronephrosis visualized. Bladder: Appears normal for degree of bladder distention. Other: None. IMPRESSION: Normal exam. Electronically Signed   By: Marcello Moores  Register   On: 02/24/2020 10:41   DG CHEST PORT 1 VIEW  Result Date: 02/29/2020 CLINICAL DATA:  Productive cough EXAM: PORTABLE CHEST 1 VIEW COMPARISON:  07/20/2016 FINDINGS: Mild cardiomegaly. Pulmonary vascular prominence. The visualized skeletal structures are unremarkable. IMPRESSION: Mild cardiomegaly with pulmonary vascular prominence but without overt pulmonary edema. No focal airspace opacity. Electronically Signed   By: Eddie Candle M.D.   On: 02/29/2020 16:54   DG Knee Right Port  Result Date: 02/28/2020 CLINICAL DATA:  Postoperative EXAM: PORTABLE RIGHT KNEE - 1-2 VIEW COMPARISON:  None. FINDINGS: Status post right knee total arthroplasty with expected overlying postoperative changes. No evidence of perihardware fracture or component malposition. IMPRESSION: Status post right knee total arthroplasty. No evidence of hardware complication. Electronically Signed   By: Eddie Candle M.D.   On: 02/28/2020 12:28   CT HEMATURIA WORKUP  Result Date: 03/01/2020 CLINICAL DATA:  Gross hematuria. EXAM: CT ABDOMEN AND PELVIS WITHOUT AND WITH CONTRAST TECHNIQUE: Multidetector CT imaging of the abdomen and pelvis was performed following the standard protocol before and following the bolus administration of intravenous contrast. CONTRAST:  135m OMNIPAQUE IOHEXOL 300 MG/ML  SOLN COMPARISON:  Renal sonogram of 02/24/2020. FINDINGS: Lower chest: Basilar atelectasis without effusion or consolidation. Hepatobiliary: Liver is normal without focal lesion. Portal vein is patent. The gallbladder without pericholecystic stranding. Pancreas: No pancreatic  abnormality, no ductal dilation, lesion or peripancreatic inflammation. Spleen: Spleen is normal size without focal lesion. Adrenals/Urinary Tract: Adrenal glands are normal. Tiny nonobstructing calculus in the upper pole the right kidney (image 31, series 2) approximately 2 mm. Similarly and interpolar calculus on the right also 2 mm. No signs of ureteral calculus. Symmetric renal enhancement. Small amount of gas in the anterior urinary bladder without signs of bladder wall thickening. Delayed images of the urinary tract show no evidence of filling defect or signs of stricture. Ureters  and urinary bladder are incompletely opacified. Stomach/Bowel: Gastric distension is moderate. Small bowel loops are fluid-filled without discrete transition point with mild dilation. Abundant stool both liquid and formed is present throughout the colon. The appendix is normal. No signs of pneumatosis or free air. No pericolonic or perienteric stranding. Vascular/Lymphatic: Calcified and noncalcified plaque throughout the abdominal aorta, no signs of aneurysm. No adenopathy. No pelvic lymphadenopathy. Reproductive: Uterus and adnexa without signs of acute process by CT. Other: Calcifications in the body wall and mild stranding in the flank, of uncertain significance potentially related to injection or prior trauma. These areas are along the posterolateral flank and or at the level of the iliac crest with thickening of overlying skin and fat stranding and soft tissue density with deeper areas in the subcutaneous fat containing calcification. Musculoskeletal: The right lower extremity is imaged to a greater extent than the left. There is stranding about the rectus femoris muscle. The contralateral side is not imaged for comparison based on scan parameters. Subacute rib fractures along the anterolateral left chest and the lateral and posterior left chest involving ribs 5 through 10. Spinal degenerative changes. IMPRESSION: 1.  Nonobstructing intrarenal calculi on the right otherwise no findings to explain hematuria. Gross hematuria would not necessarily be explained by these findings. 2. Small amount of gas in the urinary bladder, correlate with any recent urinary bladder instrumentation. 3. Global mild distension of bowel compatible with ileus. Attention on follow-up. 4. Right lower extremity is imaged to a greater extent than the left. Mild stranding about the rectus femoris muscle likely related to recent knee replacement. Correlate with any increased pain or swelling with dedicated imaging as warranted 5. Nonobstructive right nephrolithiasis. 6. Calcifications and soft tissue density in the bilateral body wall as described. Findings may relate to prior infection, injection sites or trauma. Correlate with direct clinical inspection. 7. Subacute rib fractures along the anterolateral left chest and the lateral and posterior left chest involving ribs 5 through 10. 8. Aortic atherosclerosis. Aortic Atherosclerosis (ICD10-I70.0). Electronically Signed   By: Zetta Bills M.D.   On: 03/01/2020 08:28    Disposition: Discharge disposition: 03-Skilled Nursing Facility       Discharge Instructions    Call MD / Call 911   Complete by: As directed    If you experience chest pain or shortness of breath, CALL 911 and be transported to the hospital emergency room.  If you develope a fever above 101 F, pus (white drainage) or increased drainage or redness at the wound, or calf pain, call your surgeon's office.   Constipation Prevention   Complete by: As directed    Drink plenty of fluids.  Prune juice may be helpful.  You may use a stool softener, such as Colace (over the counter) 100 mg twice a day.  Use MiraLax (over the counter) for constipation as needed.   Diet general   Complete by: As directed    Discharge instructions   Complete by: As directed    Continue WBAT on the right lower extremity. Continue to use TED stockings  until follow-up. Patient may remove them at night for sleep. Elevate the right lower extremity whenever possible. Continue to use knee immobilizer at night or when lying in bed or when elevating the operative leg. The patient may remove the knee immobilizer to perform exercises or sit in a chair. Continue using the Polar Care for comfort. Keep incision clean and dry. Cover the right knee incision during showers with a plastic bag  or Saran wrap. Take lovenox 40 mg a day for blood clot prevention. Continue to work on knee range of motion exercises at home as instructed by physical therapy. Continue to use a walker for assistance with ambulation until follow-up.   Increase activity slowly as tolerated   Complete by: As directed    Lifting restrictions   Complete by: As directed    No lifting for 12-16 weeks      Contact information for after-discharge care    Destination    [facility] Preferred SNF .   Service: Skilled Nursing Contact information: [address] [place] [phone]               Signed: Thornton Park ,MD 03/09/2020, 2:25 PM

## 2020-03-09 NOTE — Progress Notes (Signed)
  Subjective:  POD #11 s/p right TKA.   Patient reports right knee pain as mild to moderate.  She is working with PT now.  She OOB to a chair.  Her husband is present.   Objective:   VITALS:   Vitals:   03/08/20 0744 03/08/20 1624 03/09/20 0011 03/09/20 0806  BP: 128/66 121/72 109/67 133/77  Pulse: 75 87 88 93  Resp:  _0 Temp: 98 F (36.7 C) 98.1 F (36.7 C) 98.3 F (36.8 C) 98.4 F (36.9 C)  TempSrc: Oral Oral Oral Oral  SpO2: 95% 98% 98% 100%  Weight:      Height:        PHYSICAL EXAM: Right lower extremity Neurovascular intact Sensation intact distally Intact pulses distally Dorsiflexion/Plantar flexion intact Incision: dressing C/D/I No cellulitis present Compartment soft  LABS  Results for orders placed or performed during the hospital encounter of 02/28/20 (from the past 24 hour(s))  Glucose, capillary     Status: Abnormal   Collection Time: 03/08/20  4:36 PM  Result Value Ref Range   Glucose-Capillary 183 (H) 70 - 99 mg/dL  Glucose, capillary     Status: Abnormal   Collection Time: 03/08/20  9:35 PM  Result Value Ref Range   Glucose-Capillary 170 (H) 70 - 99 mg/dL   Comment 1 Notify RN   Glucose, capillary     Status: Abnormal   Collection Time: 03/09/20  8:05 AM  Result Value Ref Range   Glucose-Capillary 119 (H) 70 - 99 mg/dL  Glucose, capillary     Status: Abnormal   Collection Time: 03/09/20 11:51 AM  Result Value Ref Range   Glucose-Capillary 241 (H) 70 - 99 mg/dL  Respiratory Panel by RT PCR (Flu A&B, Covid) - Nasopharyngeal Swab     Status: None   Collection Time: 03/09/20 12:50 PM   Specimen: Nasopharyngeal Swab  Result Value Ref Range   SARS Coronavirus 2 by RT PCR NEGATIVE NEGATIVE   Influenza A by PCR NEGATIVE NEGATIVE   Influenza B by PCR NEGATIVE NEGATIVE    No results found.  Assessment/Plan: 10 Days Post-Op   Principal Problem:   S/P TKR (total knee replacement) using cement, right Active Problems:   MS (multiple  sclerosis) (HCC)   COPD exacerbation (HCC)   Depression with anxiety   Diabetes mellitus without complication (HCC)   HTN, goal below 140/90   Hyperlipidemia, unspecified   Hypothyroidism   GERD (gastroesophageal reflux disease)   Hematuria   Acute on chronic respiratory failure with hypoxia ([place])  Patient progressing post-op.  May get approval for transfer to SNF this afternoon.      Thornton Park , MD 03/09/2020, 2:13 PM

## 2020-03-09 NOTE — Progress Notes (Signed)
Physical Therapy Treatment Patient Details Name: Ashlee Fields MRN: [id number] DOB: Feb 22, 1956 Today's Date: 03/09/2020    History of Present Illness 64 y/o female s/p R total knee replacement.  Pt has MS with significant L sided involvement, h/o distant L TKA.    PT Comments    Pt was more awake and very eager to participate this date.  She did well with exercises showing increased quality of motion, quad control and ROM tolerance, but very much struggled with standing today.  Despite +2 assist, heavy cuing and encouragement she seemed to allow fear be more of a limiter for her at this point despite having a good bout of walking yesterday.  Pt continues to show good effort with PT but could not attain standing this date.     Follow Up Recommendations  SNF     Equipment Recommendations  Rolling walker with 5" wheels    Recommendations for Other Services       Precautions / Restrictions Precautions Precautions: Fall;Knee Restrictions Weight Bearing Restrictions: Yes RLE Weight Bearing: Weight bearing as tolerated    Mobility  Bed Mobility Overal bed mobility: Needs Assistance Bed Mobility: Supine to Sit       Sit to supine: Mod assist;Min assist   General bed mobility comments: Pt showed good effort, able to elevate trunk and get LEs off EOB more effectively today  Transfers Overall transfer level: Needs assistance Equipment used: Rolling walker (2 wheeled) Transfers: Sit to/from Stand Sit to Stand: +2 physical assistance;Max assist Stand pivot transfers: Max assist       General transfer comment: Pt could not get her hips forward and use UEs well enough to attain standing this date even with heavy cuing, assist and plenty of encouragement   Ambulation/Gait             General Gait Details: unable to attempt standing this date   Stairs             Wheelchair Mobility    Modified Rankin (Stroke Patients Only)       Balance Overall balance  assessment: Needs assistance Sitting-balance support: Feet supported Sitting balance-Leahy Scale: Fair Sitting balance - Comments: able to maintain upright in sitting in better this date, less leaning to the R                                    Cognition Arousal/Alertness: Awake/alert(pt more alert today than has been typical this stay) Behavior During Therapy: WFL for tasks assessed/performed Overall Cognitive Status: Within Functional Limits for tasks assessed                                        Exercises Total Joint Exercises Quad Sets: Strengthening;15 reps Short Arc Quad: AAROM;AROM;10 reps Heel Slides: Strengthening;AROM;10 reps(resisted leg extensions) Hip ABduction/ADduction: Strengthening;15 reps Straight Leg Raises: AROM;10 reps Long Arc Quad: AROM;10 reps Knee Flexion: PROM;5 reps    General Comments        Pertinent Vitals/Pain Pain Assessment: 0-10 Pain Score: 7     Home Living                      Prior Function            PT Goals (current goals can now be found in the care plan  section) Progress towards PT goals: Progressing toward goals    Frequency    BID      PT Plan Current plan remains appropriate    Co-evaluation              AM-PAC PT "6 Clicks" Mobility   Outcome Measure  Help needed turning from your back to your side while in a flat bed without using bedrails?: A Little Help needed moving from lying on your back to sitting on the side of a flat bed without using bedrails?: A Lot Help needed moving to and from a bed to a chair (including a wheelchair)?: A Lot Help needed standing up from a chair using your arms (e.g., wheelchair or bedside chair)?: Total Help needed to walk in hospital room?: Total Help needed climbing 3-5 steps with a railing? : Total 6 Click Score: 10    End of Session Equipment Utilized During Treatment: Gait belt Activity Tolerance: Patient tolerated  treatment well Patient left: with chair alarm set;with bed alarm set Nurse Communication: Mobility status PT Visit Diagnosis: Muscle weakness (generalized) (M62.81);Difficulty in walking, not elsewhere classified (R26.2);Pain Pain - Right/Left: Right Pain - part of body: Knee     Time: 7017-7939 PT Time Calculation (min) (ACUTE ONLY): 40 min  Charges:  $Therapeutic Exercise: 23-37 mins $Therapeutic Activity: 8-22 mins                     Kreg Shropshire, DPT 03/09/2020, 10:58 AM

## 2020-03-09 NOTE — Progress Notes (Signed)
Physical Therapy Treatment Patient Details Name: Ashlee Fields MRN: [id number] DOB: 05/26/1956 Today's Date: 03/09/2020    History of Present Illness 64 y/o female s/p R total knee replacement.  Pt has MS with significant L sided involvement, h/o distant L TKA.    PT Comments    Pt showed great effort this afternoon and had her farthest and best bout of ambulation yet.  She was better able to rise to standing, keep hips forward and use walker appropriate often needing only close CGA but no actual physical assist to maintain upright (did still need frequent tactile cuing and at times min/mod assist to maintain appropriate posture, recliner following t/o the effort).  She continues to struggle with LLE advancement needing extra time and effort to initiate, but by far had her best/most consistent ability to do this during ambulation this afternoon.  After brief seated rest break she was able to take a few turing steps from recliner to get back to bed, again w/o needing very much direct assist though close CGA supervision and plenty of cuing t/o the effort.   Follow Up Recommendations  SNF     Equipment Recommendations  Rolling walker with 5" wheels    Recommendations for Other Services       Precautions / Restrictions Precautions Precautions: Fall;Knee Restrictions Weight Bearing Restrictions: Yes RLE Weight Bearing: Weight bearing as tolerated    Mobility  Bed Mobility Overal bed mobility: Needs Assistance Bed Mobility: Sit to Supine       Sit to supine: Mod assist;Max assist   General bed mobility comments: Pt deferred OOC mobility  Transfers Overall transfer level: Needs assistance Equipment used: Rolling walker (2 wheeled) Transfers: Sit to/from Stand Sit to Stand: +2 safety/equipment;Mod assist         General transfer comment: Pt able to rise to standing on both attempts this afternoon much better than most previous efforts.  She still needed considerable  assistance to get to standing and get hips forward but again was able to do so much more easily and able to maintain upright posture with good use of walker to maintain this  Ambulation/Gait Ambulation/Gait assistance: Min assist;Mod assist;+2 safety/equipment Gait Distance (Feet): 8 Feet Assistive device: Rolling walker (2 wheeled)       General Gait Details: Pt had her best bout of ambulation yet.  She needed only very little assist to initiate first step with L and though she struggled each time was able to advance/step L side w/o direct assist.  Pt needed only occasional min assist to keep hips forward though essentially constant cuing given.  Pt very fatigued with the effort but showed great effort and increased distance/safety/confidence per all previous attempts.  Chair following t/o the entire effort.    Stairs             Wheelchair Mobility    Modified Rankin (Stroke Patients Only)       Balance Overall balance assessment: Needs assistance Sitting-balance support: Feet supported Sitting balance-Leahy Scale: Fair       Standing balance-Leahy Scale: Fair Standing balance comment: better consistent use of walker and ability to keep hips underneath her this afternoon t/o all standing tasks                            Cognition Arousal/Alertness: Awake/alert Behavior During Therapy: WFL for tasks assessed/performed Overall Cognitive Status: Within Functional Limits for tasks assessed  Exercises Total Joint Exercises Quad Sets: Strengthening;15 reps Heel Slides: Strengthening;AROM;AAROM;10 reps(resisted leg extensions) Hip ABduction/ADduction: Strengthening;15 reps Straight Leg Raises: AROM;10 reps Long Arc Quad: AROM;10 reps Knee Flexion: PROM;5 reps Goniometric ROM: 2-95 Other Exercises Other Exercises: LUE THEREX: 2 set x 10reps each - wrist extension, wrist fleaxion, wrist radial deviation;  IS  Other Exercises: Pt and caregiver educated re: adapted dressing techniques, polar acare management, and DME recommendations Other Exercises: Self-drinking, theraputic listening    General Comments        Pertinent Vitals/Pain Pain Assessment: 0-10 Pain Score: 7     Home Living                      Prior Function            PT Goals (current goals can now be found in the care plan section) Acute Rehab PT Goals Patient Stated Goal: " Get to rehab so I can get better faster" Progress towards PT goals: Progressing toward goals    Frequency    BID      PT Plan Current plan remains appropriate    Co-evaluation              AM-PAC PT "6 Clicks" Mobility   Outcome Measure  Help needed turning from your back to your side while in a flat bed without using bedrails?: A Little Help needed moving from lying on your back to sitting on the side of a flat bed without using bedrails?: A Lot Help needed moving to and from a bed to a chair (including a wheelchair)?: A Lot Help needed standing up from a chair using your arms (e.g., wheelchair or bedside chair)?: A Lot Help needed to walk in hospital room?: A Lot Help needed climbing 3-5 steps with a railing? : Total 6 Click Score: 12    End of Session Equipment Utilized During Treatment: Gait belt Activity Tolerance: Patient tolerated treatment well Patient left: with bed alarm set;with call bell/phone within reach;with family/visitor present Nurse Communication: Mobility status PT Visit Diagnosis: Muscle weakness (generalized) (M62.81);Difficulty in walking, not elsewhere classified (R26.2);Pain Pain - Right/Left: Right Pain - part of body: Knee     Time: 1400-1431 PT Time Calculation (min) (ACUTE ONLY): 31 min  Charges:  $Gait Training: 8-22 mins $Therapeutic Exercise: 8-22 mins                     Kreg Shropshire, DPT 03/09/2020, 4:15 PM

## 2020-03-09 NOTE — TOC Progression Note (Addendum)
Transition of Care [facility]) - Progression Note    Patient Details  Name: Ashlee Fields MRN: [id number] Date of Birth: 01/13/1956  Transition of Care [facility]) CM/SW Contact  Cristino Degroff, Gardiner Rhyme, LCSW Phone Number: 03/09/2020, 9:08 AM  Clinical Narrative:   Spoke with [name] to see if has heard anything from [facility]. She has not at this time. Will keep checking throughout the day, hopefully they will approve today sometime.  12:00 [name] feels they will get insurance auth today want a rapid COVID test. Ordered in anticipation of transfer today.  Expected Discharge Plan: [facility] Barriers to Discharge: Continued Medical Work up  Expected Discharge Plan and Services Expected Discharge Plan: [place] In-house Referral: Clinical Social Work   Post Acute Care Choice: [place] Living arrangements for the past 2 months: Single Family Home                                       Social Determinants of Health (SDOH) Interventions    Readmission Risk Interventions No flowsheet data found.

## 2020-03-09 NOTE — Evaluation (Signed)
Occupational Therapy Evaluation Patient Details Name: Ashlee Fields MRN: [id number] DOB: 1956-10-02 Today's Date: 03/09/2020    History of Present Illness 64 y/o female s/p R total knee replacement.  Pt has Ashlee with significant L sided involvement, h/o distant L TKA.   Clinical Impression   Ashlee Fields was seen for OT treatment on this date. Upon arrival to room pt seated upright in chair with family present. Pt  reporting 5/10 pain and fatigue after working c PT this AM. Pt agreeable to tx. Pt instructed in hemi dressing techniques and polar care maangement. Requiring SETUP for self-drinking seated in chair using R hand c MIN VCs to incorporate LUE. Pt verbalized understanding of instruction provided. LUE THEREX of IS and wrist flexion/extension/radial deviation completed. Pt making good progress toward goals. Pt continues to benefit from skilled OT services to maximize return to PLOF and minimize risk of future falls, injury, caregiver burden, and readmission. Will continue to follow POC. Discharge recommendation remains appropriate.      Follow Up Recommendations  CIR    Equipment Recommendations  None recommended by OT    Recommendations for Other Services       Precautions / Restrictions Precautions Precautions: Fall;Knee Restrictions Weight Bearing Restrictions: Yes RLE Weight Bearing: Weight bearing as tolerated      Mobility Bed Mobility Overal bed mobility: Needs Assistance Bed Mobility: Supine to Sit       Sit to supine: Mod assist;Min assist   General bed mobility comments: Pt deferred OOC mobility  Transfers Overall transfer level: Needs assistance Equipment used: Rolling walker (2 wheeled) Transfers: Sit to/from Stand Sit to Stand: +2 physical assistance;Max assist Stand pivot transfers: Max assist       General transfer comment: Pt could not get her hips forward and use UEs well enough to attain standing this date even with heavy cuing, assist and plenty  of encouragement     Balance Overall balance assessment: Needs assistance Sitting-balance support: Feet supported Sitting balance-Leahy Scale: Fair Sitting balance - Comments: able to maintain upright in sitting in better this date, less leaning to the R                                   ADL either performed or assessed with clinical judgement   ADL Overall ADL's : Needs assistance/impaired                                       General ADL Comments: SETUP self-drinking seated in chair. Pt and caregiver verablized teach back of hemi-dressing techniques for BLE and BUE.      Vision         Perception     Praxis      Pertinent Vitals/Pain Pain Assessment: 0-10 Pain Score: 5      Hand Dominance     Extremity/Trunk Assessment             Communication     Cognition Arousal/Alertness: Awake/alert Behavior During Therapy: WFL for tasks assessed/performed Overall Cognitive Status: Within Functional Limits for tasks assessed                                     General Comments       Exercises Exercises: Total  Joint Total Joint Exercises Quad Sets: Strengthening;15 reps Short Arc Quad: AAROM;AROM;10 reps Heel Slides: Strengthening;AROM;10 reps(resisted leg extensions) Hip ABduction/ADduction: Strengthening;15 reps Straight Leg Raises: AROM;10 reps Long Arc Quad: AROM;10 reps Knee Flexion: PROM;5 reps Other Exercises Other Exercises: LUE THEREX: 2 set x 10reps each - wrist extension, wrist fleaxion, wrist radial deviation; IS  Other Exercises: Pt and caregiver educated re: adapted dressing techniques, polar acare management, and DME recommendations Other Exercises: Self-drinking, theraputic listening   Shoulder Instructions      Home Living                                          Prior Functioning/Environment                   OT Problem List:        OT Treatment/Interventions:       OT Goals(Current goals can be found in the care plan section) Acute Rehab OT Goals Patient Stated Goal: " Get to rehab so I can get better faster" OT Goal Formulation: With patient Time For Goal Achievement: 03/14/20 Potential to Achieve Goals: Good ADL Goals Pt Will Transfer to Toilet: stand pivot transfer;with mod assist Pt/caregiver will Perform Home Exercise Program: Left upper extremity;With written HEP provided;Independently;Increased ROM;Increased strength Additional ADL Goal #1: Patient will perform sit<>stand transfer with MOD A to reduce caregiver burden and improve ability to perform toileting tasks.  OT Frequency: Min 2X/week   Barriers to D/C:            Co-evaluation              AM-PAC OT "6 Clicks" Daily Activity     Outcome Measure Help from another person eating meals?: None Help from another person taking care of personal grooming?: A Little Help from another person toileting, which includes using toliet, bedpan, or urinal?: A Lot Help from another person bathing (including washing, rinsing, drying)?: A Lot Help from another person to put on and taking off regular upper body clothing?: A Lot Help from another person to put on and taking off regular lower body clothing?: A Lot 6 Click Score: 15   End of Session    Activity Tolerance: Patient tolerated treatment well;Patient limited by fatigue Patient left: in chair;with call bell/phone within reach;with chair alarm set;with family/visitor present  OT Visit Diagnosis: Muscle weakness (generalized) (M62.81);Other (comment)                Time: 1216-2446 OT Time Calculation (min): 25 min Charges:  OT General Charges $OT Visit: 1 Visit OT Treatments $Self Care/Home Management : 23-37 mins  Dessie Coma, M.S. OTR/L  03/09/20, 1:30 PM

## 2020-03-10 LAB — GLUCOSE, CAPILLARY
Glucose-Capillary: 106 mg/dL — ABNORMAL HIGH (ref 70–99)
Glucose-Capillary: 156 mg/dL — ABNORMAL HIGH (ref 70–99)
Glucose-Capillary: 196 mg/dL — ABNORMAL HIGH (ref 70–99)
Glucose-Capillary: 216 mg/dL — ABNORMAL HIGH (ref 70–99)

## 2020-03-10 MED ORDER — ALUM & MAG HYDROXIDE-SIMETH 200-200-20 MG/5ML PO SUSP
30.0000 mL | ORAL | Status: DC | PRN
  Filled 2020-03-10: qty 30

## 2020-03-10 NOTE — Plan of Care (Signed)
Patient medicated with prn valium and newly obtained prn order for maalox for indigestion.  Awaiting authorization from [employer].  Patient and husband verbalized understanding.  Call bell in reach.  Will continue to assess.

## 2020-03-10 NOTE — Progress Notes (Signed)
Physical Therapy Treatment Patient Details Name: Ashlee Fields MRN: [id number] DOB: Aug 15, 1956 Today's Date: 03/10/2020    History of Present Illness 64 y/o female s/p R total knee replacement.  Pt has MS with significant L sided involvement, h/o distant L TKA.    PT Comments    Pt was long sitting in bed upon arriving. She is awake however very lethargic from pain meds. She agrees to PT session  and is cooperative throughout. Reports 3/10 knee pain. Pt was able to exit R sid eof bed with increased time, use of bed rails + mod assist. She was able to progress BLEs off EOB but unable to achieve full EOB sitting without mod assist for trunk support/ Upper body progression. Pt required total assist to donn shoes/AFO while seated EOB. She stood with mod assist fo one with 2nd person close for safety. Max tactle and verbal cueing to improve static standing posture prior to pt taking 4 steps with mod-max assist. Therapist max assist to progress pt's LLE fwd during ambulation. Pt fatigued very quickly with minimal activity. She required recliner placement behind her 2/2 to fatigue with taking steps. Pt very lethargic and requested resting prior to therapist returning for afternoon session. She is repositioned in recliner with call bell in reach, towel roll under RLE, and polar care reapplied. Pt limited progress this morning 2/2 to being so lethargic from medications. Therapist feels pt will benefit from SNF at DC to address strength, balance, and overall safe functional mobility. Therapist will return later this date per POC.   Follow Up Recommendations  SNF     Equipment Recommendations  Rolling walker with 5" wheels    Recommendations for Other Services       Precautions / Restrictions Precautions Precautions: Fall;Knee Precaution Booklet Issued: Yes (comment) Precaution Comments: issued HEP Restrictions Weight Bearing Restrictions: Yes RLE Weight Bearing: Weight bearing as tolerated     Mobility  Bed Mobility Overal bed mobility: Needs Assistance Bed Mobility: Sit to Supine     Supine to sit: Mod assist     General bed mobility comments: Pt was able to exit R sid eof bed with increased time and vcs for technique. continues to require mod assist to achieve EOB sitting.   Transfers Overall transfer level: Needs assistance Equipment used: Rolling walker (2 wheeled) Transfers: Sit to/from Stand Sit to Stand: +2 safety/equipment;Mod assist         General transfer comment: Pt was able to stand EOB with LLE AFO + mod assist to RW. she required tactile and vcs upon standing for posture correct and full upright standing. 2nd person close SBA for safety.  Ambulation/Gait Ambulation/Gait assistance: Mod assist;Max assist;+2 safety/equipment Gait Distance (Feet): 4 Feet Assistive device: Rolling walker (2 wheeled)   Gait velocity: decreased   General Gait Details: Pt was able to take 4 steps from EOB with RW + gait belt. Mod assist throughout with several occasions of max assist to advance LLE. Pt with poor gait posture. Constant vcs throughout for sequencing and safety. heavy use of gait belt. Pt required recliner placement behind her 2/2 to fatigue after 4 steps. +2 assist for safety but really mod-max of one to actually take steps   Stairs             Wheelchair Mobility    Modified Rankin (Stroke Patients Only)       Balance Overall balance assessment: Needs assistance Sitting-balance support: Feet supported Sitting balance-Leahy Scale: Fair Sitting balance - Comments:  able to maintain upright in sitting in better this date, less leaning to the R   Standing balance support: Bilateral upper extremity supported Standing balance-Leahy Scale: Poor Standing balance comment: pt demonstrated poor posture throughout and was very unsteady in standing. constant assist + vcs for safety. High fall risk                            Cognition  Arousal/Alertness: Lethargic Behavior During Therapy: WFL for tasks assessed/performed Overall Cognitive Status: Within Functional Limits for tasks assessed                                 General Comments: Pt is sleepy/lethargic throughout session but was able to follow commands consistenetly.       Exercises      General Comments        Pertinent Vitals/Pain Pain Assessment: 0-10 Pain Score: 3  Faces Pain Scale: Hurts a little bit Pain Location: per usual after meds, minimal pain at rest that increases with any R knee activity Pain Descriptors / Indicators: Aching;Sore;Operative site guarding;Discomfort;Grimacing;Moaning Pain Intervention(s): Limited activity within patient's tolerance;Monitored during session;Premedicated before session;Repositioned;Ice applied    Home Living                      Prior Function            PT Goals (current goals can now be found in the care plan section) Acute Rehab PT Goals Patient Stated Goal: I want to walk better" Progress towards PT goals: Progressing toward goals    Frequency    BID      PT Plan Current plan remains appropriate    Co-evaluation              AM-PAC PT "6 Clicks" Mobility   Outcome Measure  Help needed turning from your back to your side while in a flat bed without using bedrails?: A Little Help needed moving from lying on your back to sitting on the side of a flat bed without using bedrails?: A Lot Help needed moving to and from a bed to a chair (including a wheelchair)?: A Lot Help needed standing up from a chair using your arms (e.g., wheelchair or bedside chair)?: A Lot Help needed to walk in hospital room?: A Lot Help needed climbing 3-5 steps with a railing? : Total 6 Click Score: 12    End of Session Equipment Utilized During Treatment: Gait belt Activity Tolerance: Patient limited by lethargy;Patient limited by fatigue Patient left: in chair;with call bell/phone  within reach;with chair alarm set;with family/visitor present Nurse Communication: Mobility status PT Visit Diagnosis: Muscle weakness (generalized) (M62.81);Difficulty in walking, not elsewhere classified (R26.2);Pain Pain - Right/Left: Right Pain - part of body: Knee     Time: 1140-1200 PT Time Calculation (min) (ACUTE ONLY): 20 min  Charges:  $Therapeutic Activity: 8-22 mins                     Julaine Fusi PTA 03/10/20, 12:18 PM

## 2020-03-10 NOTE — Progress Notes (Signed)
Subjective: 11 Days Post-Op Procedure(s) (LRB): TOTAL KNEE ARTHROPLASTY (Right)   Awaiting insurance approval for ANF.  Patient has MS and has come along every slowly.  Needs skilled care.   Patient reports pain as mild.  Objective:   VITALS:   Vitals:   03/10/20 0740 03/10/20 1108  BP: (!) 90/46 (!) 119/58  Pulse: 87 97  Resp: 16   Temp: 97.9 F (36.6 C)   SpO2: 90%     Neurologically intact Sensation intact distally Intact pulses distally Dorsiflexion/Plantar flexion intact  LABS No results for input(s): HGB, HCT, WBC, PLT in the last 72 hours.  No results for input(s): NA, K, BUN, CREATININE, GLUCOSE in the last 72 hours.  No results for input(s): LABPT, INR in the last 72 hours.   Assessment/Plan: 11 Days Post-Op Procedure(s) (LRB): TOTAL KNEE ARTHROPLASTY (Right)   Advance diet Up with therapy Discharge to SNF

## 2020-03-10 NOTE — Progress Notes (Signed)
Physical Therapy Treatment Patient Details Name: Ashlee Fields MRN: [id number] DOB: 02-01-1956 Today's Date: 03/10/2020    History of Present Illness 64 y/o female s/p R total knee replacement.  Pt has MS with significant L sided involvement, h/o distant L TKA.    PT Comments    Pt was asleep in recliner upon arriving. She states" I can't do anything this afternoon, today is not a good day." Pt continues to present with being very lethargic. Therapist questions if medication is causing this. She did with max encouragement agree to therapist assisting her back into bed from recliner. Max assist to stand pivot to EOB. Gait belt + BUE support by therapist. Max assist to reposition from sitting EOB to supine. +2 assistance for safety but only one person assist to perform task. Progress contnues to be limited by pt's alertness/ tolerance to medicine. She will benefit from SNF at DC to address strength, balance, gait, and safe functional mobility deficits. Acute PT will continue to follow per POC progressing as able per pt tolerance. RN tech in room at conclusion of session.    Follow Up Recommendations  SNF     Equipment Recommendations  Rolling walker with 5" wheels    Recommendations for Other Services       Precautions / Restrictions Precautions Precautions: Fall;Knee Precaution Booklet Issued: Yes (comment) Precaution Comments: issued HEP Restrictions Weight Bearing Restrictions: Yes RLE Weight Bearing: Weight bearing as tolerated    Mobility  Bed Mobility Overal bed mobility: Needs Assistance Bed Mobility: Sit to Supine     Supine to sit: Mod assist Sit to supine: Mod assist;Max assist   General bed mobility comments: Pt required mod-max assist to repositioned from sitting EOB to supine in bed. vcs throughout. max assist with LE progression   Transfers Overall transfer level: Needs assistance Equipment used: None(gait belt) Transfers: Stand Pivot Transfers Sit to  Stand: +2 safety/equipment;Max assist Stand pivot transfers: Max assist;+2 safety/equipment       General transfer comment: Max assist to stand pivot back to bed from recliner. pt unwilling to trial ambulation or other activity except returning to bed  Ambulation/Gait Ambulation/Gait assistance: Mod assist;Max assist;+2 safety/equipment Gait Distance (Feet): 4 Feet Assistive device: Rolling walker (2 wheeled)   Gait velocity: decreased   General Gait Details: pt unwilling to trial this afternoon. Pt very lethargic throughout the day   Stairs             Wheelchair Mobility    Modified Rankin (Stroke Patients Only)       Balance Overall balance assessment: Needs assistance Sitting-balance support: Feet supported Sitting balance-Leahy Scale: Fair Sitting balance - Comments: able to maintain upright in sitting in better this date, less leaning to the R   Standing balance support: Bilateral upper extremity supported Standing balance-Leahy Scale: Poor Standing balance comment: pt demonstrated poor posture throughout and was very unsteady in standing. constant assist + vcs for safety. High fall risk                            Cognition Arousal/Alertness: Lethargic Behavior During Therapy: WFL for tasks assessed/performed Overall Cognitive Status: Within Functional Limits for tasks assessed                                 General Comments: pt continues to be lethargic from medications. She refused PT session at first  but eventually agrees to get back to bed from recliner      Exercises      General Comments        Pertinent Vitals/Pain Pain Assessment: 0-10 Pain Score: 6  Faces Pain Scale: Hurts little more Pain Location: per usual after meds, minimal pain at rest that increases with any R knee activity Pain Descriptors / Indicators: Aching;Sore;Operative site guarding;Discomfort;Grimacing;Moaning Pain Intervention(s): Limited  activity within patient's tolerance;Monitored during session;Premedicated before session;Repositioned;Ice applied    Home Living                      Prior Function            PT Goals (current goals can now be found in the care plan section) Acute Rehab PT Goals Patient Stated Goal: I want to walk better" Progress towards PT goals: Not progressing toward goals - comment(due to being lethargic from medicine)    Frequency    BID      PT Plan Current plan remains appropriate    Co-evaluation              AM-PAC PT "6 Clicks" Mobility   Outcome Measure  Help needed turning from your back to your side while in a flat bed without using bedrails?: A Little Help needed moving from lying on your back to sitting on the side of a flat bed without using bedrails?: A Lot Help needed moving to and from a bed to a chair (including a wheelchair)?: A Lot Help needed standing up from a chair using your arms (e.g., wheelchair or bedside chair)?: A Lot Help needed to walk in hospital room?: A Lot Help needed climbing 3-5 steps with a railing? : Total 6 Click Score: 12    End of Session Equipment Utilized During Treatment: Gait belt Activity Tolerance: Patient limited by lethargy;Patient limited by fatigue Patient left: in bed;with call bell/phone within reach;with bed alarm set;with family/visitor present;with nursing/sitter in room Nurse Communication: Mobility status PT Visit Diagnosis: Muscle weakness (generalized) (M62.81);Difficulty in walking, not elsewhere classified (R26.2);Pain Pain - Right/Left: Right Pain - part of body: Knee     Time: 0881-1031 PT Time Calculation (min) (ACUTE ONLY): 19 min  Charges:  $Therapeutic Activity: 8-22 mins                     Julaine Fusi PTA 03/10/20, 2:37 PM

## 2020-03-11 LAB — GLUCOSE, CAPILLARY
Glucose-Capillary: 138 mg/dL — ABNORMAL HIGH (ref 70–99)
Glucose-Capillary: 189 mg/dL — ABNORMAL HIGH (ref 70–99)
Glucose-Capillary: 203 mg/dL — ABNORMAL HIGH (ref 70–99)
Glucose-Capillary: 158 mg/dL — ABNORMAL HIGH (ref 70–99)
Glucose-Capillary: 138 mg/dL — ABNORMAL HIGH (ref 70–99)

## 2020-03-11 MED ORDER — INSULIN ASPART 100 UNIT/ML ~~LOC~~ SOLN: 0.0000 [IU] | Freq: Three times a day (TID) | SUBCUTANEOUS | Status: DC

## 2020-03-11 MED ORDER — INSULIN ASPART 100 UNIT/ML ~~LOC~~ SOLN: 0.0000 [IU] | Freq: Every day | SUBCUTANEOUS | Status: DC

## 2020-03-11 MED ORDER — INSULIN ASPART 100 UNIT/ML ~~LOC~~ SOLN
0.0000 [IU] | Freq: Three times a day (TID) | SUBCUTANEOUS | Status: DC
  Administered 2020-03-12: 8 [IU] via SUBCUTANEOUS
  Filled 2020-03-11: qty 1

## 2020-03-11 NOTE — TOC Progression Note (Addendum)
Transition of Care [facility]) - Progression Note    Patient Details  Name: Ashlee Fields MRN: [id number] Date of Birth: 1956-09-19  Transition of Care [facility]) CM/SW Contact  Boris Sharper, LCSW Phone Number: 03/11/2020, 2:01 PM  Clinical Narrative:    CSW called Tammy with [employer] to follow up on [employer] authorization. She stated no authorization at this time  Expected Discharge Plan: [place] Barriers to Discharge: Barriers Resolved  Expected Discharge Plan and Services Expected Discharge Plan: [place] In-house Referral: Clinical Social Work   Post Acute Care Choice: [place] Living arrangements for the past 2 months: Single Family Home Expected Discharge Date: 03/09/20                                     Social Determinants of Health (SDOH) Interventions    Readmission Risk Interventions No flowsheet data found.

## 2020-03-11 NOTE — Progress Notes (Addendum)
Physical Therapy Treatment Patient Details Name: Ashlee Fields MRN: [id number] DOB: 1956/03/03 Today's Date: 03/11/2020    History of Present Illness 64 y/o female s/p R total knee replacement.  Pt has MS with significant L sided involvement, h/o distant L TKA.    PT Comments    Pt agrees to session on 2nd attempt.  Asks to remain in bed due to needing to have a BM and has received laxatives.  Pt stated she is frustrated because nursing won't leave bed pan under her for an extended period of time.  Discussed skin integrity/breakdown issues with extended time on bedpan.  She voices understanding stating "That makes sense."  She does complete 2 x 10 ex as below and stretching of R knee in supine.  Overall strength and ROM is generally increasing but she voices frustration with limited and difficult standing and transfers.  Encouragement given.  She also voices fear of walker thinking it will tip over on her when standing education given.   Follow Up Recommendations  SNF     Equipment Recommendations  Rolling walker with 5" wheels    Recommendations for Other Services       Precautions / Restrictions Precautions Precautions: Fall;Knee Precaution Booklet Issued: Yes (comment) Precaution Comments: issued HEP Restrictions Weight Bearing Restrictions: Yes RLE Weight Bearing: Weight bearing as tolerated    Mobility  Bed Mobility               General bed mobility comments: declined OOB due to needing to have  BM.  stated she has had laxatives.  Transfers                    Ambulation/Gait                 Stairs             Wheelchair Mobility    Modified Rankin (Stroke Patients Only)       Balance                                            Cognition Arousal/Alertness: Awake/alert Behavior During Therapy: WFL for tasks assessed/performed Overall Cognitive Status: Within Functional Limits for tasks assessed                                        Exercises Other Exercises Other Exercises: BLE A/AAROM as needed -2 x 10 ankle pumps, quad sets, SLR, AB/ADD, SAQ and heel slides, stretching R knee flexion in supine    General Comments        Pertinent Vitals/Pain Pain Assessment: Faces Faces Pain Scale: Hurts little more Pain Location: R knee increased with ROM and stretching Pain Descriptors / Indicators: Aching;Sore;Operative site guarding;Discomfort;Grimacing;Moaning Pain Intervention(s): Limited activity within patient's tolerance;Monitored during session    Home Living                      Prior Function            PT Goals (current goals can now be found in the care plan section) Progress towards PT goals: Progressing toward goals    Frequency    BID      PT Plan Current plan remains appropriate    Co-evaluation  AM-PAC PT "6 Clicks" Mobility   Outcome Measure  Help needed turning from your back to your side while in a flat bed without using bedrails?: A Little Help needed moving from lying on your back to sitting on the side of a flat bed without using bedrails?: A Lot Help needed moving to and from a bed to a chair (including a wheelchair)?: A Lot Help needed standing up from a chair using your arms (e.g., wheelchair or bedside chair)?: A Lot Help needed to walk in hospital room?: A Lot Help needed climbing 3-5 steps with a railing? : Total 6 Click Score: 12    End of Session   Activity Tolerance: Patient tolerated treatment well Patient left: in bed;with call bell/phone within reach;with bed alarm set Nurse Communication: Mobility status Pain - Right/Left: Right Pain - part of body: Knee     Time: 2500-3704 PT Time Calculation (min) (ACUTE ONLY): 22 min  Charges:                        [name], PTA 03/11/20, 11:53 AM

## 2020-03-11 NOTE — Progress Notes (Signed)
Subjective: 12 Days Post-Op Procedure(s) (LRB): TOTAL KNEE ARTHROPLASTY (Right)   Patient is comfortable today.  She did PT in the bed as she was unable to stand well.  Still awaiting insurance authorization.  Patient reports pain as mild.  Objective:   VITALS:   Vitals:   03/11/20 0859 03/11/20 1635  BP: 107/71 110/72  Pulse: 100 80  Resp: 18 16  Temp: 99.3 F (37.4 C) 98.6 F (37 C)  SpO2: 91% 97%    Neurologically intact Intact pulses distally Dorsiflexion/Plantar flexion intact Incision: dressing C/D/I  LABS No results for input(s): HGB, HCT, WBC, PLT in the last 72 hours.  No results for input(s): NA, K, BUN, CREATININE, GLUCOSE in the last 72 hours.  No results for input(s): LABPT, INR in the last 72 hours.   Assessment/Plan: 12 Days Post-Op Procedure(s) (LRB): TOTAL KNEE ARTHROPLASTY (Right)   Advance diet Up with therapy Discharge to SNF

## 2020-03-11 NOTE — TOC Progression Note (Signed)
Transition of Care [facility]) - Progression Note    Patient Details  Name: Ashlee Fields MRN: [id number] Date of Birth: [birth year]  Transition of Care [facility]) CM/SW Contact  Boris Sharper, [place] Phone Number: 03/11/2020, 2:04 PM  Clinical Narrative:    CSW reached out to [facility] and left a message to follow up on authorization.   Expected Discharge Plan: [place] Barriers to Discharge: Barriers Resolved  Expected Discharge Plan and Services Expected Discharge Plan: [place] In-house Referral: Clinical Social Work   Post Acute Care Choice: [place] Living arrangements for the past 2 months: Single Family Home Expected Discharge Date: 03/09/20                                     Social Determinants of Health (SDOH) Interventions    Readmission Risk Interventions No flowsheet data found.

## 2020-03-12 LAB — GLUCOSE, CAPILLARY
Glucose-Capillary: 110 mg/dL — ABNORMAL HIGH (ref 70–99)
Glucose-Capillary: 252 mg/dL — ABNORMAL HIGH (ref 70–99)

## 2020-03-12 MED ORDER — OXYCODONE HCL 5 MG PO TABS: 5.0000 mg | ORAL_TABLET | ORAL | 0 refills | Status: DC | PRN

## 2020-03-12 NOTE — Progress Notes (Signed)
Discharge note: EMS just transported pt, personal effects, polar care, and knee brace along with home medications Dimethyl Fumerate 223m and [facility]

## 2020-03-12 NOTE — Care Management Important Message (Signed)
Important Message  Patient Details  Name: Ashlee Fields MRN: [id number] Date of Birth: 28-Feb-1956   Medicare Important Message Given:  Yes     Juliann Pulse A Daiden Coltrane 03/12/2020, 10:53 AM

## 2020-03-12 NOTE — Progress Notes (Signed)
Physical Therapy Treatment Patient Details Name: Ashlee Fields MRN: [id number] DOB: 1956/08/25 Today's Date: 03/12/2020    History of Present Illness 64 y/o female s/p R total knee replacement.  Pt has MS with significant L sided involvement, h/o distant L TKA.    PT Comments    Participated in exercises as described below.  Pt awaiting discharge to SNF this pm.  Follow Up Recommendations  SNF     Equipment Recommendations  Rolling walker with 5" wheels    Recommendations for Other Services       Precautions / Restrictions Precautions Precautions: Fall;Knee Precaution Booklet Issued: Yes (comment) Precaution Comments: issued HEP Restrictions Weight Bearing Restrictions: Yes RLE Weight Bearing: Weight bearing as tolerated    Mobility  Bed Mobility Overal bed mobility: Needs Assistance Bed Mobility: Supine to Sit;Sit to Supine     Supine to sit: Mod assist Sit to supine: Mod assist;+2 for physical assistance      Transfers Overall transfer level: Needs assistance Equipment used: Rolling walker (2 wheeled);None Transfers: Sit to/from [employer] to Stand: Max assist;+2 physical assistance Stand pivot transfers: Max assist;+2 safety/equipment       General transfer comment: able to stand with max a x 2, min a x 2 once up but unable to step to transfer,  Ambulation/Gait                 Stairs             Wheelchair Mobility    Modified Rankin (Stroke Patients Only)       Balance Overall balance assessment: Needs assistance Sitting-balance support: Feet supported Sitting balance-Leahy Scale: Fair     Standing balance support: Bilateral upper extremity supported Standing balance-Leahy Scale: Poor Standing balance comment: exensive external assist with walker to remain standing.                            Cognition Arousal/Alertness: Awake/alert Behavior During Therapy: WFL for tasks  assessed/performed Overall Cognitive Status: Within Functional Limits for tasks assessed                                        Exercises Other Exercises Other Exercises: Supine ex's BLE 10X 5X each legs for ankle pumps, quad sets, heel slides, ab/add and SLR    General Comments        Pertinent Vitals/Pain Pain Assessment: Faces Faces Pain Scale: Hurts little more Pain Location: R knee increased with ROM and stretching Pain Descriptors / Indicators: Aching;Sore;Operative site guarding;Discomfort;Grimacing;Moaning Pain Intervention(s): Limited activity within patient's tolerance;Monitored during session;Repositioned;Ice applied    Home Living                      Prior Function            PT Goals (current goals can now be found in the care plan section) Progress towards PT goals: Progressing toward goals    Frequency    BID      PT Plan Current plan remains appropriate    Co-evaluation              AM-PAC PT "6 Clicks" Mobility   Outcome Measure  Help needed turning from your back to your side while in a flat bed without using bedrails?: A Little Help needed moving from lying on your  back to sitting on the side of a flat bed without using bedrails?: A Lot Help needed moving to and from a bed to a chair (including a wheelchair)?: A Lot Help needed standing up from a chair using your arms (e.g., wheelchair or bedside chair)?: A Lot Help needed to walk in hospital room?: Total Help needed climbing 3-5 steps with a railing? : Total 6 Click Score: 11    End of Session Equipment Utilized During Treatment: Gait belt Activity Tolerance: Patient tolerated treatment well Patient left: in bed;with call bell/phone within reach;with bed alarm set;with family/visitor present Nurse Communication: Mobility status Pain - Right/Left: Right Pain - part of body: Knee     Time: 8338-2505 PT Time Calculation (min) (ACUTE ONLY): 15  min  Charges:  $Therapeutic Exercise: 8-22 mins $Therapeutic Activity: 8-22 mins                    [name], PTA 03/12/20, 1:55 PM

## 2020-03-12 NOTE — Progress Notes (Signed)
Progress/discharge note: Writer called [employer] and gave report to Lupita Raider, she will be in rm 160.  SW called EMS for transport.

## 2020-03-12 NOTE — Progress Notes (Signed)
  Subjective:  POD #14 s/p right TKA.   Patient reports right knee pain as mild.    Objective:   VITALS:   Vitals:   03/11/20 0859 03/11/20 1635 03/11/20 2259 03/12/20 0739  BP: 107/71 110/72 121/75 126/60  Pulse: 100 80 87 94  Resp: _0 Temp: 99.3 F (37.4 C) 98.6 F (37 C) 98.9 F (37.2 C) 99.1 F (37.3 C)  TempSrc: Oral Oral Oral Oral  SpO2: 91% 97% 91% 94%  Weight:      Height:        PHYSICAL EXAM: Right lower extremity Neurovascular intact Sensation intact distally Intact pulses distally Dorsiflexion/Plantar flexion intact Incision: dressing C/D/I No cellulitis present Compartment soft  LABS  Results for orders placed or performed during the hospital encounter of 02/28/20 (from the past 24 hour(s))  Glucose, capillary     Status: Abnormal   Collection Time: 03/11/20  4:36 PM  Result Value Ref Range   Glucose-Capillary 158 (H) 70 - 99 mg/dL   Comment 1 Notify RN    Comment 2 Document in Chart   Glucose, capillary     Status: Abnormal   Collection Time: 03/11/20  8:48 PM  Result Value Ref Range   Glucose-Capillary 138 (H) 70 - 99 mg/dL   Comment 1 Notify RN   Glucose, capillary     Status: Abnormal   Collection Time: 03/12/20  7:41 AM  Result Value Ref Range   Glucose-Capillary 110 (H) 70 - 99 mg/dL   Comment 1 Notify RN   Glucose, capillary     Status: Abnormal   Collection Time: 03/12/20 12:13 PM  Result Value Ref Range   Glucose-Capillary 252 (H) 70 - 99 mg/dL   Comment 1 Notify RN     No results found.  Assessment/Plan: 13 Days Post-Op   Principal Problem:   S/P TKR (total knee replacement) using cement, right Active Problems:   MS (multiple sclerosis) (HCC)   COPD exacerbation (HCC)   Depression with anxiety   Diabetes mellitus without complication (HCC)   HTN, goal below 140/90   Hyperlipidemia, unspecified   Hypothyroidism   GERD (gastroesophageal reflux disease)   Hematuria   Acute on chronic respiratory failure with  hypoxia [facility])  Patient has finally been approved for SNF and will be discharged today and follow up with me in 7-10 days.    Thornton Park , MD 03/12/2020, 1:53 PM

## 2020-03-12 NOTE — Progress Notes (Signed)
Physical Therapy Treatment Patient Details Name: Ashlee Fields MRN: [id number] DOB: Jul 08, 1956 Today's Date: 03/12/2020    History of Present Illness 64 y/o female s/p R total knee replacement.  Pt has MS with significant L sided involvement, h/o distant L TKA.    PT Comments    Pt wanting to walk today.  Shoes donned.  To EOB with mod a x 1 to sit fully upright.  Once sitting, generally steady.  LAQ x 10 BLE.  Less pain today in R knee and able to flex to 90 degrees in sitting comfortably.  Stood to walker with max a x 2 to stand then min a x 2 once fully up.  Gilford Rile is held down as she remains fearful that it will tip over.  She is given extended time to try to step but she is unable despite assist and encouragement.  She stated she needs to have a BM.  Stand pivot to commode at bedside with max a x 2/dependant.  She is able to have a BM on commode and transferred back to bed with same assist.  Doristine Bosworth outside door waiting to visit.  Will continue with ex in PM.  Pt is appropriate for sit to stand lift with nursing staff if needed due to assist level and safety.   Follow Up Recommendations  SNF     Equipment Recommendations  Rolling walker with 5" wheels    Recommendations for Other Services       Precautions / Restrictions Precautions Precautions: Fall;Knee Precaution Booklet Issued: Yes (comment) Precaution Comments: issued HEP Restrictions Weight Bearing Restrictions: Yes RLE Weight Bearing: Weight bearing as tolerated    Mobility  Bed Mobility Overal bed mobility: Needs Assistance Bed Mobility: Supine to Sit;Sit to Supine     Supine to sit: Mod assist Sit to supine: Mod assist;+2 for physical assistance      Transfers Overall transfer level: Needs assistance Equipment used: Rolling walker (2 wheeled);None Transfers: Sit to/from [employer] to Stand: Max assist;+2 physical assistance Stand pivot transfers: Max assist;+2 safety/equipment        General transfer comment: able to stand with max a x 2, min a x 2 once up but unable to step to transfer,  Ambulation/Gait                 Stairs             Wheelchair Mobility    Modified Rankin (Stroke Patients Only)       Balance Overall balance assessment: Needs assistance Sitting-balance support: Feet supported Sitting balance-Leahy Scale: Fair     Standing balance support: Bilateral upper extremity supported Standing balance-Leahy Scale: Poor Standing balance comment: exensive external assist with walker to remain standing.                            Cognition Arousal/Alertness: Awake/alert Behavior During Therapy: WFL for tasks assessed/performed Overall Cognitive Status: Within Functional Limits for tasks assessed                                        Exercises Other Exercises Other Exercises: to commode for BM    General Comments        Pertinent Vitals/Pain Pain Assessment: Faces Faces Pain Scale: Hurts little more Pain Location: R knee increased with ROM and stretching Pain Descriptors /  Indicators: Aching;Sore;Operative site guarding;Discomfort;Grimacing;Moaning Pain Intervention(s): Limited activity within patient's tolerance;Monitored during session;Ice applied    Home Living                      Prior Function            PT Goals (current goals can now be found in the care plan section) Progress towards PT goals: Progressing toward goals    Frequency    BID      PT Plan Current plan remains appropriate    Co-evaluation              AM-PAC PT "6 Clicks" Mobility   Outcome Measure  Help needed turning from your back to your side while in a flat bed without using bedrails?: A Little Help needed moving from lying on your back to sitting on the side of a flat bed without using bedrails?: A Lot Help needed moving to and from a bed to a chair (including a wheelchair)?: A  Lot Help needed standing up from a chair using your arms (e.g., wheelchair or bedside chair)?: A Lot Help needed to walk in hospital room?: Total Help needed climbing 3-5 steps with a railing? : Total 6 Click Score: 11    End of Session Equipment Utilized During Treatment: Gait belt Activity Tolerance: Patient tolerated treatment well Patient left: in bed;with call bell/phone within reach;with bed alarm set;with family/visitor present Nurse Communication: Mobility status Pain - Right/Left: Right Pain - part of body: Knee     Time: 1039-1110 PT Time Calculation (min) (ACUTE ONLY): 31 min  Charges:  $Therapeutic Exercise: 8-22 mins $Therapeutic Activity: 8-22 mins                    [name], PTA 03/12/20, 11:19 AM

## 2020-03-12 NOTE — TOC Progression Note (Addendum)
Transition of Care [facility]) - Progression Note    Patient Details  Name: Ashlee Fields MRN: [id number] Date of Birth: [birth year]  Transition of Care [facility]) CM/SW Contact  Latika Kronick, Gardiner Rhyme, LCSW Phone Number: 03/12/2020, 9:32 AM  Clinical Narrative:  Left voice mail for CM-Carilita regarding pt's insurance auth for [facility]. [name] is also calling her an decision needs to be made today.   10:34 AM Spoke with [name] the right CM at [facility] has the information and will give an answer today regarding approval.  Expected Discharge Plan: [place] Barriers to Discharge: Barriers Resolved  Expected Discharge Plan and Services Expected Discharge Plan: [place] In-house Referral: Clinical Social Work   Post Acute Care Choice: [place] Living arrangements for the past 2 months: Single Family Home Expected Discharge Date: 03/09/20                                     Social Determinants of Health (SDOH) Interventions    Readmission Risk Interventions No flowsheet data found.

## 2020-03-12 NOTE — Plan of Care (Signed)
  Problem: Health Behavior/Discharge Planning: Goal: Ability to manage health-related needs will improve Outcome: Progressing   Problem: Education: Goal: Knowledge of General Education information will improve Description: Including pain rating scale, medication(s)/side effects and non-pharmacologic comfort measures Outcome: Progressing   Problem: Clinical Measurements: Goal: Ability to maintain clinical measurements within normal limits will improve Outcome: Progressing Goal: Will remain free from infection Outcome: Progressing Goal: Diagnostic test results will improve Outcome: Progressing Goal: Respiratory complications will improve Outcome: Progressing Goal: Cardiovascular complication will be avoided Outcome: Progressing   Problem: Nutrition: Goal: Adequate nutrition will be maintained Outcome: Progressing   Problem: Coping: Goal: Level of anxiety will decrease Outcome: Progressing   Problem: Elimination: Goal: Will not experience complications related to bowel motility Outcome: Progressing Goal: Will not experience complications related to urinary retention Outcome: Progressing   Problem: Pain Managment: Goal: General experience of comfort will improve Outcome: Progressing   Problem: Safety: Goal: Ability to remain free from injury will improve Outcome: Progressing   Problem: Skin Integrity: Goal: Risk for impaired skin integrity will decrease Outcome: Progressing   Problem: Education: Goal: Knowledge of the prescribed therapeutic regimen will improve Outcome: Progressing Goal: Individualized Educational Video(s) Outcome: Progressing   Problem: Activity: Goal: Ability to avoid complications of mobility impairment will improve Outcome: Progressing Goal: Range of joint motion will improve Outcome: Progressing   Problem: Clinical Measurements: Goal: Postoperative complications will be avoided or minimized Outcome: Progressing   Problem: Pain  Management: Goal: Pain level will decrease with appropriate interventions Outcome: Progressing   Problem: Skin Integrity: Goal: Will show signs of wound healing Outcome: Progressing   Problem: Education: Goal: Ability to describe self-care measures that may prevent or decrease complications (Diabetes Survival Skills Education) will improve Outcome: Progressing Goal: Individualized Educational Video(s) Outcome: Progressing   Problem: Coping: Goal: Ability to adjust to condition or change in health will improve Outcome: Progressing   Problem: Fluid Volume: Goal: Ability to maintain a balanced intake and output will improve Outcome: Progressing   Problem: Health Behavior/Discharge Planning: Goal: Ability to identify and utilize available resources and services will improve Outcome: Progressing Goal: Ability to manage health-related needs will improve Outcome: Progressing   Problem: Metabolic: Goal: Ability to maintain appropriate glucose levels will improve Outcome: Progressing   Problem: Nutritional: Goal: Maintenance of adequate nutrition will improve Outcome: Progressing Goal: Progress toward achieving an optimal weight will improve Outcome: Progressing   Problem: Skin Integrity: Goal: Risk for impaired skin integrity will decrease Outcome: Progressing   Problem: Tissue Perfusion: Goal: Adequacy of tissue perfusion will improve Outcome: Progressing

## 2020-03-26 ENCOUNTER — Telehealth: Payer: Self-pay

## 2020-03-26 NOTE — Telephone Encounter (Signed)
Lin Landsman, MD  Felipa Furnace, CMA  Please schedule appointment with me   RV   Called and left a message for call back

## 2020-03-26 NOTE — Telephone Encounter (Signed)
Made appointment 05/21/2020

## 2020-03-30 ENCOUNTER — Encounter: Payer: Self-pay | Admitting: Gastroenterology

## 2020-04-02 NOTE — Progress Notes (Signed)
04/03/20   CC:  Chief Complaint  Patient presents with  . Cysto    HPI: Ashlee Fields is a 64 y.o. F who presents today for the evaluation and management of gross hematuria.   Her UA from 02/20/20 revealed 4-10 RBC, 4-10 WBC and few bacteria.   Her most recent CT urogram from 02/29/20 revealed nonobstructing intrarenal calculi on the right otherwise no findings to explain hematuria.   No bothersome urinary symptoms today. She reports of vaginal irritation she believes may be related to [place] during admission.    Today's Vitals   04/03/20 1112  BP: (!) 141/87  Pulse: (!) 101   There is no height or weight on file to calculate BMI. NED. A&Ox3.   No respiratory distress   Abd soft, NT, ND Normal external genitalia with patent urethral meatus  Cystoscopy Procedure Note  Patient identification was confirmed, informed consent was obtained, and patient was prepped using Betadine solution.  Lidocaine jelly was administered per urethral meatus.    Procedure: - Flexible cystoscope introduced, without any difficulty.   - Thorough search of the bladder revealed:    normal urethral meatus    normal urothelium    no stones    no ulcers     no tumors    no urethral polyps    no trabeculation  - Ureteral orifices were normal in position and appearance.  Post-Procedure: - Patient tolerated the procedure well  Pertinent Imagings:  CLINICAL DATA:  Gross hematuria.  EXAM: CT ABDOMEN AND PELVIS WITHOUT AND WITH CONTRAST  TECHNIQUE: Multidetector CT imaging of the abdomen and pelvis was performed following the standard protocol before and following the bolus administration of intravenous contrast.  CONTRAST:  155m OMNIPAQUE IOHEXOL 300 MG/ML  SOLN  COMPARISON:  Renal sonogram of 02/24/2020.  FINDINGS: Lower chest: Basilar atelectasis without effusion or consolidation.  Hepatobiliary: Liver is normal without focal lesion. Portal vein is patent. The gallbladder  without pericholecystic stranding.  Pancreas: No pancreatic abnormality, no ductal dilation, lesion or peripancreatic inflammation.  Spleen: Spleen is normal size without focal lesion.  Adrenals/Urinary Tract: Adrenal glands are normal.  Tiny nonobstructing calculus in the upper pole the right kidney (image 31, series 2) approximately 2 mm. Similarly and interpolar calculus on the right also 2 mm. No signs of ureteral calculus.  Symmetric renal enhancement. Small amount of gas in the anterior urinary bladder without signs of bladder wall thickening.  Delayed images of the urinary tract show no evidence of filling defect or signs of stricture. Ureters and urinary bladder are incompletely opacified.  Stomach/Bowel: Gastric distension is moderate. Small bowel loops are fluid-filled without discrete transition point with mild dilation.  Abundant stool both liquid and formed is present throughout the colon. The appendix is normal. No signs of pneumatosis or free air. No pericolonic or perienteric stranding.  Vascular/Lymphatic: Calcified and noncalcified plaque throughout the abdominal aorta, no signs of aneurysm. No adenopathy.  No pelvic lymphadenopathy.  Reproductive: Uterus and adnexa without signs of acute process by CT.  Other: Calcifications in the body wall and mild stranding in the flank, of uncertain significance potentially related to injection or prior trauma. These areas are along the posterolateral flank and or at the level of the iliac crest with thickening of overlying skin and fat stranding and soft tissue density with deeper areas in the subcutaneous fat containing calcification.  Musculoskeletal: The right lower extremity is imaged to a greater extent than the left. There is stranding about the  rectus femoris muscle. The contralateral side is not imaged for comparison based on scan parameters.  Subacute rib fractures along the anterolateral  left chest and the lateral and posterior left chest involving ribs 5 through 10. Spinal degenerative changes.  IMPRESSION: 1. Nonobstructing intrarenal calculi on the right otherwise no findings to explain hematuria. Gross hematuria would not necessarily be explained by these findings. 2. Small amount of gas in the urinary bladder, correlate with any recent urinary bladder instrumentation. 3. Global mild distension of bowel compatible with ileus. Attention on follow-up. 4. Right lower extremity is imaged to a greater extent than the left. Mild stranding about the rectus femoris muscle likely related to recent knee replacement. Correlate with any increased pain or swelling with dedicated imaging as warranted 5. Nonobstructive right nephrolithiasis. 6. Calcifications and soft tissue density in the bilateral body wall as described. Findings may relate to prior infection, injection sites or trauma. Correlate with direct clinical inspection. 7. Subacute rib fractures along the anterolateral left chest and the lateral and posterior left chest involving ribs 5 through 10. 8. Aortic atherosclerosis.  Aortic Atherosclerosis (ICD10-I70.0).   Electronically Signed   By: Zetta Bills M.D.   On: 03/01/2020 08:28  I have personally reviewed the images and agree with radiologist interpretation.   Assessment/ Plan:  1. Gross hematuria  1 dose of Cipro given today in light of recent implant for ppx CT revealed nonobstructing intrarenal calculi on the right otherwise no findings to explain hematuria.  Cysto unremarkable  2. Vaginal Irritation  Encouraged use of Vagisil  If not improved, may benefit from estrogen cream  3. Nephrolithiasis  CT revealed nonobstructing intrarenal calculi on the right-- punctate, would not recommend treatment  Return in 6 months for KUB w/ Sam PA-C    Return in about 6 months (around 10/04/2020) for Kub with Sam.  I, Lucas Mallow, am acting as  a scribe for Dr. Hollice Espy,  I have reviewed the above documentation for accuracy and completeness, and I agree with the above.   Hollice Espy, MD

## 2020-04-03 ENCOUNTER — Other Ambulatory Visit: Payer: Self-pay

## 2020-04-03 ENCOUNTER — Encounter: Payer: Self-pay | Admitting: Urology

## 2020-04-03 ENCOUNTER — Ambulatory Visit: Payer: Medicare HMO | Admitting: Urology

## 2020-04-03 VITALS — BP 141/87 | HR 101

## 2020-04-03 DIAGNOSIS — R319 Hematuria, unspecified: Secondary | ICD-10-CM | POA: Diagnosis not present

## 2020-04-03 MED ORDER — CIPROFLOXACIN HCL 500 MG PO TABS
500.0000 mg | ORAL_TABLET | Freq: Once | ORAL | Status: AC
  Administered 2020-04-03: 500 mg via ORAL

## 2020-04-04 LAB — URINALYSIS, COMPLETE
Bilirubin, UA: NEGATIVE
Glucose, UA: NEGATIVE
Leukocytes,UA: NEGATIVE
Nitrite, UA: NEGATIVE
Protein,UA: NEGATIVE
Specific Gravity, UA: 1.02 (ref 1.005–1.030)
Urobilinogen, Ur: 0.2 mg/dL (ref 0.2–1.0)
pH, UA: 6.5 (ref 5.0–7.5)

## 2020-04-04 LAB — MICROSCOPIC EXAMINATION: Bacteria, UA: NONE SEEN

## 2020-04-17 ENCOUNTER — Ambulatory Visit
Admission: RE | Admit: 2020-04-17 | Discharge: 2020-04-17 | Disposition: A | Discharge status: Home / Self Care | Payer: Medicare HMO | Source: Ambulatory Visit | Attending: Neurology | Admitting: Neurology

## 2020-04-17 ENCOUNTER — Other Ambulatory Visit: Payer: Self-pay

## 2020-04-17 DIAGNOSIS — G35 Multiple sclerosis: Secondary | ICD-10-CM | POA: Diagnosis not present

## 2020-04-17 MED ORDER — GADOBUTROL 1 MMOL/ML IV SOLN
10.0000 mL | Freq: Once | INTRAVENOUS | Status: AC | PRN
  Administered 2020-04-17: 10 mL via INTRAVENOUS

## 2020-04-18 ENCOUNTER — Encounter: Payer: Self-pay | Admitting: Gastroenterology

## 2020-04-18 ENCOUNTER — Ambulatory Visit: Payer: Medicare HMO | Admitting: Gastroenterology

## 2020-04-18 ENCOUNTER — Other Ambulatory Visit (INDEPENDENT_AMBULATORY_CARE_PROVIDER_SITE_OTHER): Payer: Medicare HMO

## 2020-04-18 VITALS — BP 140/80 | HR 96 | Ht 63.0 in | Wt 195.0 lb

## 2020-04-18 DIAGNOSIS — R110 Nausea: Secondary | ICD-10-CM | POA: Diagnosis not present

## 2020-04-18 DIAGNOSIS — R12 Heartburn: Secondary | ICD-10-CM | POA: Diagnosis not present

## 2020-04-18 DIAGNOSIS — R748 Abnormal levels of other serum enzymes: Secondary | ICD-10-CM

## 2020-04-18 LAB — HEPATIC FUNCTION PANEL
ALT: 24 U/L (ref 0–35)
AST: 19 U/L (ref 0–37)
Albumin: 4 g/dL (ref 3.5–5.2)
Alkaline Phosphatase: 95 U/L (ref 39–117)
Bilirubin, Direct: 0.1 mg/dL (ref 0.0–0.3)
Total Bilirubin: 0.4 mg/dL (ref 0.2–1.2)
Total Protein: 6.1 g/dL (ref 6.0–8.3)

## 2020-04-18 LAB — GAMMA GT: GGT: 28 U/L (ref 7–51)

## 2020-04-18 MED ORDER — FAMOTIDINE 40 MG PO TABS: 40.0000 mg | ORAL_TABLET | Freq: Every day | ORAL | 2 refills | Status: DC

## 2020-04-18 NOTE — Patient Instructions (Addendum)
If you are age 66 or older, your body mass index should be between 23-30. Your Body mass index is 34.54 kg/m. If this is out of the aforementioned range listed, please consider follow up with your Primary Care Provider.  If you are age 75 or younger, your body mass index should be between 19-25. Your Body mass index is 34.54 kg/m. If this is out of the aformentioned range listed, please consider follow up with your Primary Care Provider.    You have been scheduled for an endoscopy. Please follow written instructions given to you at your visit today. If you use inhalers (even only as needed), please bring them with you on the day of your procedure.  Continue Pantoprazole 80m twice daily.   Famotidine- 424mat bedtime.   Your provider has requested that you go to the basement level for lab work before leaving today. Press "B" on the elevator. The lab is located at the first door on the left as you exit the elevator.  You will need screening colonoscopy after recovering from your knee surgery.   Thank you for choosing me and [facility].  Dr. BeTarri Glenn

## 2020-04-18 NOTE — Progress Notes (Signed)
Referring Provider: [name], MD Primary Care Physician:  [name], MD  Reason for Consultation: Nausea, abnormal liver enzymes   IMPRESSION:  Nausea Dysphonia with intermittent solid/liquid dysphagia GERD on PPI BID Mildly elevated alkaline phosphatase    - normal liver enzymes from 2016-2020    - normal liver on CT 02/29/20 Mild thrombocytosis of unclear etiology Need for colon cancer screening Prior colonoscopy with Dr. Sonny Masters 2007 Maternal grandmother with colon cancer at an advanced age  Nausea and dysphonia in the setting of long-standing GERD despite PPI BID:  Nausea is a common side effect of ketorolac, which she was using prior to her knee surgery. She is on multiple other medications associated with nausea including Tecfidera.   Given her chronic prednisone, she is also at risk for PUD. Must also consider reflux esophagitis, infectious esophagitis, eosinophilic esophagitis, gastroparesis, and bacterial overgrowth.    Elevated alk phos: Not present on prior labs over the last several years. Will repeat today prior to proceeding with additional evaluation.   Abnormal CT scan obtained the day after TKA: Possible opiod-induced ileus. Unclear if this is the cause of her recent symptoms as her symptoms preceded surgery. Low threshold to repeat dedicated contrasted CT scan.   Colon cancer screening: Discussed screening colonoscopy. Will proceed when she has completed physical therapy following her recent knee surgery and is able to safely prep for the procedure.     PLAN: Continue pantoprazole 40 mg BID Famotidine 300 mg QHS Hepatic function panel, GGTP EGD at the hospital now Avoid medications associated with nausea Screening colonoscopy when she recovers from her knee surgery  Please see the "Patient Instructions" section for addition details about the plan.  I spent 45 minutes, including in depth chart review, face-to-face time with the patient,  coordinating care, ordering studies and medications as appropriate, and documentation.   HPI: Ashlee Fields is a 64 y.o. female referred for indigestion, nausea, and heartburn. The history is obtained through the patient and review of her electronic health record.  She has osteoarthritis, hyperlipidemia, obesity, COPD, pulmonary hypertension, anxiety, depression, hypertension, hyperlipidemia, osteoporosis, diabetes on insulin, hypothyroidism, and multiple sclerosis on long-term use of steroids.  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 She is a Curator. Her husband accompanies her to this appointment. She completed Covid vaccine series in March 2021.   She has been followed at the [facility] for many years.  She had a colonoscopy and EGD for reflux with Dr. Sonny Masters in 2007.  Both were noted to be normal exams.  She was last seen by NP [place] at the [facility] 06/02/2019 for daily heartburn and nausea despite cimetidine OTC and other antacids for breakthrough symptoms.  EGD and screening colonoscopy recommended at that time. Apparently, they no longer have a GI clinic and she is looking to establish care.  Has been on pantoprazole 40 mg BID for years.  Notes stool tests for colon cancer screening in recent years.   Developed nausea prior to her knee surgery that resulted in her seeking care. Knee pain was treated with Tylenol and Tramadol at that time. Took some oxy after her surgery but denies the use of opiates prior to surgery.   She was so nauseated that she was getting dehydrated. Symptoms have improved on Reglan daily and Zofran PRN.    Nausea intermittently associated with eating. Some nocturnal nausea and early morning nausea.  Some dysphagia with solid foods or liquids if she drinks too fast.  Mild dysphonia.  Chronic constipation.  No other associated symptoms. No other identified exacerbating or relieving features.   Sister with similar problems.   Gabapentin recently started for restless  leg syndrome.  Does not use NSAIDs because they don't work or cause a rash.   CT hematuria work-up 02/29/2020 showed a normal liver without focal lesion, patent portal vein, normal gallbladder, and normal pancreas.  Moderate gastric distention was noted.  Multiple fluid filled small bowel loops with associated dilation without discrete transition point consistent with ileus.  There is an abundant amount of liquid and formed stool throughout the colon.  Other recent abdominal imaging includes abdominal ultrasound 03/02/2015 was normal except for nonobstructing right renal calculus.  Labs 03/23/20 show a normal CBC except for an RDW of 16.8.  Hemoglobin 12.1 and MCV 96.1.  Platelets elevated at 475.  Alk phos 118 (UNL 104), total protein 6.0, albumin 4.0, AST 33, ALT 26, total bilirubin 0.4.  I reviewed multiple liver enzymes all normal back to 2015.  The alkaline phosphatase has historically ranged from 59-103.  Maternal grandmother with colon cancer (in early 48).  No known family history of colon cancer or polyps. No family history of uterine/endometrial cancer, pancreatic cancer or gastric/stomach cancer.   Past Medical History:  Diagnosis Date  . Anxiety   . Arthritis   . Depression   . Diabetes mellitus without complication ([place])   . GERD (gastroesophageal reflux disease)   . Hypertension   . Hypothyroidism   . Multiple sclerosis ([place]) 1980  . Osteoporosis   . Sleep apnea   . Vision abnormalities     Past Surgical History:  Procedure Laterality Date  . CESAREAN SECTION    . COLONOSCOPY    . JOINT REPLACEMENT    . REPLACEMENT TOTAL KNEE Left 2009  . RIGHT HEART CATH Right 08/05/2017   Procedure: RIGHT HEART CATH;  Surgeon: Teodoro Spray, MD;  Location: [facility];  Service: Cardiovascular;  Laterality: Right;  . TOTAL KNEE ARTHROPLASTY Right 02/28/2020   Procedure: TOTAL KNEE ARTHROPLASTY;  Surgeon: Thornton Park, MD;  Location: [facility] OR[facility];  Service: Orthopedics;   Laterality: Right;    Current Outpatient Medications  Medication Sig Dispense Refill  . acetaminophen (TYLENOL) 500 MG tablet Take 2 tablets (1,000 mg total) by mouth 3 (three) times daily. 30 tablet 0  . alendronate (FOSAMAX) 70 MG tablet Take 70 mg by mouth once a week. Take with a full glass of water on an empty stomach.  sunday    . atorvastatin (LIPITOR) 80 MG tablet Take 80 mg by mouth at bedtime.    . baclofen (LIORESAL) 20 MG tablet Take 40 mg by mouth at bedtime.     . clindamycin (CLEOCIN) 150 MG capsule Take 600 mg by mouth See admin instructions. Take 600 mg 1 hour prior to dental work    . cyanocobalamin (,VITAMIN B-12,) 1000 MCG/ML injection Inject 1,000 mcg into the muscle every 30 (thirty) days.     . diazepam (VALIUM) 2 MG tablet Take 2 mg by mouth 4 (four) times daily as needed for muscle spasms.     . Dimethyl Fumarate (TECFIDERA) 240 MG CPDR Take 240 mg by mouth in the morning and at bedtime.    Marland Kitchen FLUoxetine (PROZAC) 20 MG capsule Take 60 mg by mouth daily.    . hydrochlorothiazide (HYDRODIURIL) 25 MG tablet Take 25 mg by mouth daily.     Marland Kitchen levothyroxine (SYNTHROID, LEVOTHROID) 50 MCG tablet Take 50 mcg  by mouth daily before breakfast.    . LORazepam (ATIVAN) 1 MG tablet Take 1 mg by mouth 2 (two) times daily.     . metFORMIN (GLUCOPHAGE-XR) 500 MG 24 hr tablet Take 1,500 mg by mouth at bedtime.   3  . metoCLOPramide (REGLAN) 5 MG tablet Take 5 mg by mouth 3 (three) times daily before meals.    . mirabegron ER (MYRBETRIQ) 50 MG TB24 tablet Take 50 mg by mouth daily.    . Multiple Vitamin (MULTIVITAMIN WITH MINERALS) TABS tablet Take 1 tablet by mouth at bedtime. GERITOL COMPLETE MULTIVITAMIN.     . ondansetron (ZOFRAN-ODT) 4 MG disintegrating tablet Take 4 mg by mouth every 8 (eight) hours as needed for nausea/vomiting.    . pantoprazole (PROTONIX) 40 MG tablet Take 40 mg by mouth daily.     . Phendimetrazine Tartrate 35 MG TABS Take 105 mg by mouth daily.    . predniSONE  (DELTASONE) 10 MG tablet Take 10 mg by mouth daily with breakfast.     . Semaglutide,0.25 or 0.5MG/DOS, (OZEMPIC, 0.25 OR 0.5 MG/DOSE,) 2 MG/1.5ML SOPN Inject 0.25 mg into the skin every Sunday.    . valsartan-hydrochlorothiazide (DIOVAN-HCT) 320-12.5 MG tablet Take 1 tablet by mouth daily.    . famotidine (PEPCID) 40 MG tablet Take 1 tablet (40 mg total) by mouth at bedtime. 30 tablet 2   No current facility-administered medications for this visit.    Allergies as of 04/18/2020 - Review Complete 04/18/2020  Allergen Reaction Noted  . Penicillins Hives 05/06/2016  . Sulfa antibiotics Hives and Nausea And Vomiting 05/06/2016  . Ibuprofen Rash 08/04/2017    Family History  Problem Relation Age of Onset  . Breast cancer Paternal Grandmother   . Heart disease Mother   . Kidney failure Father   . Colon cancer Maternal Grandmother     Social History   Socioeconomic History  . Marital status: Married    Spouse name: Not on file  . Number of children: Not on file  . Years of education: Not on file  . Highest education level: Not on file  Occupational History  . Not on file  Tobacco Use  . Smoking status: Former Smoker    Packs/day: 0.50    Types: Cigarettes    Quit date: 2018    Years since quitting: 3.4  . Smokeless tobacco: Never Used  Substance and Sexual Activity  . Alcohol use: Never  . Drug use: Never  . Sexual activity: Not on file  Other Topics Concern  . Not on file  Social History Narrative  . Not on file   Social Determinants of Health   Financial Resource Strain:   . Difficulty of Paying Living Expenses:   Food Insecurity:   . Worried About Charity fundraiser in the Last Year:   . Arboriculturist in the Last Year:   Transportation Needs:   . Film/video editor (Medical):   Marland Kitchen Lack of Transportation (Non-Medical):   Physical Activity:   . Days of Exercise per Week:   . Minutes of Exercise per Session:   Stress:   . Feeling of Stress :   Social  Connections:   . Frequency of Communication with Friends and Family:   . Frequency of Social Gatherings with Friends and Family:   . Attends Religious Services:   . Active Member of Clubs or Organizations:   . Attends Archivist Meetings:   Marland Kitchen Marital Status:   Intimate  Partner Violence:   . Fear of Current or Ex-Partner:   . Emotionally Abused:   Marland Kitchen Physically Abused:   . Sexually Abused:     Review of Systems: 12 system ROS is negative except as noted above except for anxiety, back pain, depression, fatigue, and urine leakage.   Physical Exam: General:   Alert,  well-nourished, pleasant and cooperative in NAD. Examined in a wheelchair.  Head:  Normocephalic and atraumatic. Eyes:  Sclera clear, no icterus.   Conjunctiva pink. Ears:  Normal auditory acuity. Nose:  No deformity, discharge,  or lesions. Mouth:  No deformity or lesions.   Neck:  Supple; no masses or thyromegaly. Lungs:  Clear throughout to auscultation.   No wheezes. Heart:  Regular rate and rhythm; no murmurs. Abdomen:  Soft,nontender, nondistended, normal bowel sounds, no rebound or guarding. No hepatosplenomegaly.   Rectal:  Deferred  Msk:  Symmetrical. No boney deformities LAD: No inguinal or umbilical LAD Extremities:  No clubbing or edema. Neurologic:  Alert and  oriented x4;  grossly nonfocal Skin:  Intact without significant lesions or rashes. Psych:  Alert and cooperative. Normal mood and affect.   Shuntay Everetts L. Tarri Glenn, MD, MPH 04/18/2020, 2:35 PM

## 2020-04-24 ENCOUNTER — Telehealth: Payer: Self-pay | Admitting: Gastroenterology

## 2020-04-24 NOTE — Telephone Encounter (Signed)
Cancelled Endoscopy at [facility] w/ Dr. Tarri Glenn on 05/02/2020 at 1 pm, left detailed message letting patient know that procedure was cancelled per pt request due to having procedure done elsewhere.

## 2020-04-27 ENCOUNTER — Inpatient Hospital Stay (HOSPITAL_COMMUNITY): Payer: Medicare HMO | Attending: Hematology

## 2020-05-02 ENCOUNTER — Ambulatory Visit (HOSPITAL_COMMUNITY): Admission: RE | Admit: 2020-05-02 | Payer: Medicare HMO | Source: Home / Self Care | Admitting: Gastroenterology

## 2020-05-02 ENCOUNTER — Encounter (HOSPITAL_COMMUNITY): Admission: RE | Payer: Self-pay | Source: Home / Self Care

## 2020-05-02 SURGERY — ESOPHAGOGASTRODUODENOSCOPY (EGD) WITH PROPOFOL
Anesthesia: Monitor Anesthesia Care

## 2020-05-09 ENCOUNTER — Telehealth: Payer: Self-pay

## 2020-05-09 NOTE — Telephone Encounter (Signed)
Pt calling to see if any of our doctors do the botox inj for the bladder or do anything for bladder incontinence.  [phone]  Left detailed msg we do not do the botox inj but we did treat bladder incontinence.  To call and schedule.

## 2020-05-21 ENCOUNTER — Ambulatory Visit: Payer: Medicare HMO | Admitting: Gastroenterology

## 2020-05-21 ENCOUNTER — Ambulatory Visit: Payer: Medicare HMO | Admitting: Urology

## 2020-05-21 ENCOUNTER — Other Ambulatory Visit: Payer: Self-pay

## 2020-05-21 VITALS — BP 150/91 | HR 86 | Wt 195.0 lb

## 2020-05-21 DIAGNOSIS — R32 Unspecified urinary incontinence: Secondary | ICD-10-CM

## 2020-05-21 DIAGNOSIS — N3941 Urge incontinence: Secondary | ICD-10-CM | POA: Diagnosis not present

## 2020-05-21 DIAGNOSIS — R319 Hematuria, unspecified: Secondary | ICD-10-CM | POA: Diagnosis not present

## 2020-05-21 LAB — BLADDER SCAN AMB NON-IMAGING: Scan Result: 54

## 2020-05-21 MED ORDER — SOLIFENACIN SUCCINATE 5 MG PO TABS
5.0000 mg | ORAL_TABLET | Freq: Every day | ORAL | 11 refills | Status: DC
Start: 2020-05-21 — End: 2020-07-02

## 2020-05-21 MED ORDER — OXYBUTYNIN CHLORIDE ER 10 MG PO TB24
10.0000 mg | ORAL_TABLET | Freq: Every day | ORAL | 11 refills | Status: DC
Start: 2020-05-21 — End: 2020-10-08

## 2020-05-21 NOTE — Progress Notes (Signed)
05/21/2020 9:57 AM   Ashlee Fields 02-17-1956 [id number]  Referring provider: [name], MD [place] [facility] [place],  [place] [address]  No chief complaint on file.   HPI: Dr Erlene Quan: Cleared for hematuria with nonobstructing stone right kidney with negative cystoscopy.  I was consulted to assist the patient is urinary incontinence.  She has urge incontinence especially after sitting for a few hours.  She can soak a double pad system with high-volume leakage.  She changes this I think twice a day.  Some of the details were more difficult.  She denies stress incontinence and bedwetting.  She is 20% improved on Myrbetriq  She thinks she has had 3 bladder infections recently but was nonspecific and the only symptom was agitation.  She had a total knee and had the Purewick this to him in the hospital.  Urine culture in April normal.  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  No previous bladder surgery.  No hysterectomy.  Bowel movements normal.   PMH: Past Medical History:  Diagnosis Date  . Anxiety   . Arthritis   . Depression   . Diabetes mellitus without complication ([place])   . GERD (gastroesophageal reflux disease)   . Hypertension   . Hypothyroidism   . Multiple sclerosis ([place]) 1980  . Osteoporosis   . Sleep apnea   . Vision abnormalities     Surgical History: Past Surgical History:  Procedure Laterality Date  . CESAREAN SECTION    . COLONOSCOPY    . JOINT REPLACEMENT    . REPLACEMENT TOTAL KNEE Left 2009  . RIGHT HEART CATH Right 08/05/2017   Procedure: RIGHT HEART CATH;  Surgeon: Teodoro Spray, MD;  Location: [place] [facility];  Service: Cardiovascular;  Laterality: Right;  . TOTAL KNEE ARTHROPLASTY Right 02/28/2020   Procedure: TOTAL KNEE ARTHROPLASTY;  Surgeon: Thornton Park, MD;  Location: [facility];  Service: Orthopedics;  Laterality: Right;    Home  Medications:  Allergies as of 05/21/2020      Reactions   Penicillins Hives   Did it involve swelling of the face/tongue/throat, SOB, or low BP? No Did it involve sudden or severe rash/hives, skin peeling, or any reaction on the inside of your mouth or nose? Yes Did you need to seek medical attention at a hospital or doctor's office? Unknown When did it last happen?20 + years If all above answers are "NO", may proceed with cephalosporin use.   Sulfa Antibiotics Hives, Nausea And Vomiting   Ibuprofen Rash      Medication List       Accurate as of May 21, 2020  9:57 AM. If you have any questions, ask your nurse or doctor.        acetaminophen 500 MG tablet Commonly known as: TYLENOL Take 2 tablets (1,000 mg total) by mouth 3 (three) times daily.   alendronate 70 MG tablet Commonly known as: FOSAMAX Take 70 mg by mouth once a week. Take with a full glass of water on an empty stomach.  sunday   atorvastatin 80 MG tablet Commonly known as: LIPITOR Take 80 mg by mouth at bedtime.   baclofen 20 MG tablet Commonly known as: LIORESAL Take 40 mg by mouth at bedtime.   clindamycin 150 MG capsule Commonly known as: CLEOCIN Take 600 mg by mouth See admin instructions. Take 600 mg 1 hour prior to dental work  cyanocobalamin 1000 MCG/ML injection Commonly known as: (VITAMIN B-12) Inject 1,000 mcg into the muscle every 30 (thirty) days.   diazepam 2 MG tablet Commonly known as: VALIUM Take 2 mg by mouth 4 (four) times daily as needed for muscle spasms.   famotidine 40 MG tablet Commonly known as: PEPCID Take 1 tablet (40 mg total) by mouth at bedtime.   FLUoxetine 20 MG capsule Commonly known as: PROZAC Take 60 mg by mouth daily.   hydrochlorothiazide 25 MG tablet Commonly known as: HYDRODIURIL Take 25 mg by mouth daily.   levothyroxine 50 MCG tablet Commonly known as: SYNTHROID Take 50 mcg by mouth daily before breakfast.   LORazepam 1 MG tablet Commonly  known as: ATIVAN Take 1 mg by mouth 2 (two) times daily.   metFORMIN 500 MG 24 hr tablet Commonly known as: GLUCOPHAGE-XR Take 1,500 mg by mouth at bedtime.   metoCLOPramide 5 MG tablet Commonly known as: REGLAN Take 5 mg by mouth 3 (three) times daily before meals.   multivitamin with minerals Tabs tablet Take 1 tablet by mouth at bedtime. GERITOL COMPLETE MULTIVITAMIN.   Myrbetriq 50 MG Tb24 tablet Generic drug: mirabegron ER Take 50 mg by mouth daily.   ondansetron 4 MG disintegrating tablet Commonly known as: ZOFRAN-ODT Take 4 mg by mouth every 8 (eight) hours as needed for nausea/vomiting.   Ozempic (0.25 or 0.5 MG/DOSE) 2 MG/1.5ML Sopn Generic drug: Semaglutide(0.25 or 0.5MG/DOS) Inject 0.25 mg into the skin every Sunday.   pantoprazole 40 MG tablet Commonly known as: PROTONIX Take 40 mg by mouth daily.   Phendimetrazine Tartrate 35 MG Tabs Take 105 mg by mouth daily.   predniSONE 10 MG tablet Commonly known as: DELTASONE Take 10 mg by mouth daily with breakfast.   Tecfidera 240 MG Cpdr Generic drug: Dimethyl Fumarate Take 240 mg by mouth in the morning and at bedtime.   valsartan-hydrochlorothiazide 320-12.5 MG tablet Commonly known as: DIOVAN-HCT Take 1 tablet by mouth daily.       Allergies:  Allergies  Allergen Reactions  . Penicillins Hives    Did it involve swelling of the face/tongue/throat, SOB, or low BP? No Did it involve sudden or severe rash/hives, skin peeling, or any reaction on the inside of your mouth or nose? Yes Did you need to seek medical attention at a hospital or doctor's office? Unknown When did it last happen?20 + years If all above answers are "NO", may proceed with cephalosporin use.    . Sulfa Antibiotics Hives and Nausea And Vomiting  . Ibuprofen Rash    Family History: Family History  Problem Relation Age of Onset  . Breast cancer Paternal Grandmother   . Heart disease Mother   . Kidney failure Father   .  Colon cancer Maternal Grandmother     Social History:  reports that she quit smoking about 3 years ago. Her smoking use included cigarettes. She smoked 0.50 packs per day. She has never used smokeless tobacco. She reports that she does not drink alcohol and does not use drugs.  ROS:                                        Physical Exam: There were no vitals taken for this visit.  Constitutional:  Alert and oriented, No acute distress. HEENT: [place] AT, moist mucus membranes.  Trachea midline, no masses. Cardiovascular: No clubbing, cyanosis, or edema. Respiratory:  Normal respiratory effort, no increased work of breathing. GI: Abdomen is soft, nontender, nondistended, no abdominal masses GU: No CVA tenderness.  Grade 1 hypermobility bladder neck negative cough test no prolapse Skin: No rashes, bruises or suspicious lesions. Lymph: No cervical or inguinal adenopathy. Neurologic: Grossly intact, no focal deficits, moving all 4 extremities. Psychiatric: Normal mood and affect.  Laboratory Data: Lab Results  Component Value Date   WBC 11.6 (H) 03/01/2020   HGB 10.0 (L) 03/01/2020   HCT 31.3 (L) 03/01/2020   MCV 93.2 03/01/2020   PLT 333 03/01/2020    Lab Results  Component Value Date   CREATININE 0.63 02/29/2020    No results found for: PSA  No results found for: TESTOSTERONE  No results found for: HGBA1C  Urinalysis    Component Value Date/Time   COLORURINE RED (A) 02/29/2020 0130   APPEARANCEUR Clear 04/03/2020 1100   LABSPEC 1.025 02/29/2020 0130   PHURINE 6.0 02/29/2020 0130   GLUCOSEU Negative 04/03/2020 1100   HGBUR LARGE (A) 02/29/2020 0130   BILIRUBINUR Negative 04/03/2020 1100   KETONESUR NEGATIVE 02/29/2020 0130   PROTEINUR Negative 04/03/2020 1100   PROTEINUR 100 (A) 02/29/2020 0130   NITRITE Negative 04/03/2020 1100   NITRITE NEGATIVE 02/29/2020 0130   LEUKOCYTESUR Negative 04/03/2020 1100   LEUKOCYTESUR NEGATIVE 02/29/2020 0130     Pertinent Imaging: Chart reviewed.  Urine reviewed.  Urine culture sent   Assessment & Plan: Patient has urge incontinence high-volume triggered when she goes from a sitting to standing position.  I will call if urine culture is positive.  Reassess in 6 weeks on oxybutynin ER 10 mg and Vesicare 5 mg prescriptions.  She tried another antimuscarinic I believe but did not know the name.  I had her stop the Myrbetriq.  Reassess 8 weeks    1. Hematuria, unspecified type   2. Urinary incontinence, unspecified type  - Urinalysis, Complete - BLADDER SCAN AMB NON-IMAGING   No follow-ups on file.  Reece Packer, MD  [facility] [address] [place], [place] [address] [phone]

## 2020-05-22 LAB — URINALYSIS, COMPLETE
Bilirubin, UA: NEGATIVE
Glucose, UA: NEGATIVE
Ketones, UA: NEGATIVE
Nitrite, UA: NEGATIVE
Protein,UA: NEGATIVE
Specific Gravity, UA: 1.02 (ref 1.005–1.030)
Urobilinogen, Ur: 0.2 mg/dL (ref 0.2–1.0)
pH, UA: 8.5 — ABNORMAL HIGH (ref 5.0–7.5)

## 2020-05-22 LAB — MICROSCOPIC EXAMINATION: Bacteria, UA: NONE SEEN

## 2020-05-27 LAB — CULTURE, URINE COMPREHENSIVE

## 2020-05-29 ENCOUNTER — Telehealth: Payer: Self-pay | Admitting: *Deleted

## 2020-05-29 MED ORDER — NITROFURANTOIN MACROCRYSTAL 100 MG PO CAPS
100.0000 mg | ORAL_CAPSULE | Freq: Two times a day (BID) | ORAL | 0 refills | Status: DC
Start: 2020-05-29 — End: 2020-07-02

## 2020-05-29 NOTE — Telephone Encounter (Addendum)
Patient informed, sent in Macrodantin to pharmacy as requested.   ----- Message from Bjorn Loser, MD sent at 05/28/2020 10:41 AM EDT ----- Macrodantin 100 mg twice a day for 7 days    ----- Message ----- From: Shanon Ace, CMA Sent: 05/25/2020  12:12 PM EDT To: Bjorn Loser, MD   ----- Message ----- From: Interface, [facility] Lab Results In Sent: 05/22/2020  11:37 AM EDT To: [facility] Clinical

## 2020-06-18 ENCOUNTER — Telehealth: Payer: Self-pay | Admitting: Radiology

## 2020-06-18 NOTE — Telephone Encounter (Signed)
Patient states oxybutynin has not improved her incontinence and asks if a different medication can be prescribed or should her follow up appointment be moved up? Please advise.

## 2020-06-18 NOTE — Telephone Encounter (Signed)
Okay to move up appointment

## 2020-07-02 ENCOUNTER — Other Ambulatory Visit: Payer: Self-pay

## 2020-07-02 ENCOUNTER — Ambulatory Visit: Payer: Self-pay | Admitting: Urology

## 2020-07-02 ENCOUNTER — Ambulatory Visit: Payer: Medicare HMO | Admitting: Urology

## 2020-07-02 ENCOUNTER — Encounter: Payer: Self-pay | Admitting: Urology

## 2020-07-02 VITALS — BP 132/81 | HR 87 | Ht 63.0 in | Wt 190.0 lb

## 2020-07-02 DIAGNOSIS — R32 Unspecified urinary incontinence: Secondary | ICD-10-CM | POA: Diagnosis not present

## 2020-07-02 DIAGNOSIS — N3946 Mixed incontinence: Secondary | ICD-10-CM | POA: Diagnosis not present

## 2020-07-02 LAB — URINALYSIS, COMPLETE
Bilirubin, UA: NEGATIVE
Glucose, UA: NEGATIVE
Leukocytes,UA: NEGATIVE
Nitrite, UA: NEGATIVE
Protein,UA: NEGATIVE
RBC, UA: NEGATIVE
Specific Gravity, UA: 1.02 (ref 1.005–1.030)
Urobilinogen, Ur: 0.2 mg/dL (ref 0.2–1.0)
pH, UA: 7 (ref 5.0–7.5)

## 2020-07-02 LAB — MICROSCOPIC EXAMINATION: Bacteria, UA: NONE SEEN

## 2020-07-02 NOTE — Progress Notes (Signed)
07/02/2020 10:03 AM   Ashlee Fields December 25, 1955 [id number]  Referring provider: [name], MD [facility] [place],  [place] [address]  No chief complaint on file.   HPI: Dr Erlene Quan: Cleared for hematuria with nonobstructing stone right kidney with negative cystoscopy.  I was consulted to assist the patient is urinary incontinence.  She has urge incontinence especially after sitting for a few hours.  She can soak a double pad system with high-volume leakage.  She changes this I think twice a day.  Some of the details were more difficult.  She denies stress incontinence and bedwetting.  She is 20% improved on Myrbetriq  She thinks she has had 3 bladder infections recently but was nonspecific and the only symptom was agitation.  She had a total knee and had the Purewick this to him in the hospital.  Urine culture in April normal.  I did not see other positive cultures    Grade 1 hypermobility bladder neck negative cough test no prolapse  Patient has urge incontinence high-volume triggered when she goes from a sitting to standing position.  I will call if urine culture is positive.  Reassess in 6 weeks on oxybutynin ER 10 mg and Vesicare 5 mg prescriptions.  She tried another antimuscarinic I believe but did not know the name.  I had her stop the Myrbetriq.  Reassess 8 weeks  Today Frequency stable.  Last urine culture positive.  Blood culture prior to that was negative Urge incontinence persisting.  High-volume leakage persistent.  Triggered when she goes from sitting to standing position.  Urine chronically orange-colored.  She has multiple sclerosis and her left side weakness with decreased functionality because of it.  No blood thinner pacemaker  I went over all 3 refractory treatments with her and went through full template.  The conversation became a little bit circular.  We talked about ordering urodynamics but it was reasonable not to but she  understands retention rate with Botox with multiple sclerosis could approach 30% and she may or may not be able to perform self-catheterization versus a Foley catheter for a few weeks at a time with subsequent trials of voiding.  She was concerned about Covid virus exposure coming for percutaneous tibial nerve stimulation.    PMH: Past Medical History:  Diagnosis Date  . Anxiety   . Arthritis   . Depression   . Diabetes mellitus without complication ([place])   . GERD (gastroesophageal reflux disease)   . Hypertension   . Hypothyroidism   . Multiple sclerosis ([place]) 1980  . Osteoporosis   . Sleep apnea   . Vision abnormalities     Surgical History: Past Surgical History:  Procedure Laterality Date  . CESAREAN SECTION    . COLONOSCOPY    . JOINT REPLACEMENT    . REPLACEMENT TOTAL KNEE Left 2009  . RIGHT HEART CATH Right 08/05/2017   Procedure: RIGHT HEART CATH;  Surgeon: Teodoro Spray, MD;  Location: [facility];  Service: Cardiovascular;  Laterality: Right;  . TOTAL KNEE ARTHROPLASTY Right 02/28/2020   Procedure: TOTAL KNEE ARTHROPLASTY;  Surgeon: Thornton Park, MD;  Location: [facility];  Service: Orthopedics;  Laterality: Right;    Home Medications:  Allergies as of 07/02/2020      Reactions   Penicillins Hives   Did it involve swelling of the face/tongue/throat, SOB, or low BP? No Did it involve sudden or severe rash/hives, skin peeling, or any reaction on the inside  of your mouth or nose? Yes Did you need to seek medical attention at a hospital or doctor's office? Unknown When did it last happen?20 + years If all above answers are "NO", may proceed with cephalosporin use.   Sulfa Antibiotics Hives, Nausea And Vomiting   Ibuprofen Rash      Medication List       Accurate as of July 02, 2020 10:03 AM. If you have any questions, ask your nurse or doctor.        acetaminophen 500 MG tablet Commonly known as: TYLENOL Take 2 tablets (1,000 mg total) by  mouth 3 (three) times daily.   alendronate 70 MG tablet Commonly known as: FOSAMAX Take 70 mg by mouth once a week. Take with a full glass of water on an empty stomach.  sunday   atorvastatin 80 MG tablet Commonly known as: LIPITOR Take 80 mg by mouth at bedtime.   baclofen 20 MG tablet Commonly known as: LIORESAL Take 40 mg by mouth at bedtime.   cyanocobalamin 1000 MCG/ML injection Commonly known as: (VITAMIN B-12) Inject 1,000 mcg into the muscle every 30 (thirty) days.   diazepam 2 MG tablet Commonly known as: VALIUM Take 2 mg by mouth 4 (four) times daily as needed for muscle spasms.   famotidine 40 MG tablet Commonly known as: PEPCID Take 1 tablet (40 mg total) by mouth at bedtime.   FLUoxetine 20 MG capsule Commonly known as: PROZAC Take 60 mg by mouth daily.   gabapentin 100 MG capsule Commonly known as: NEURONTIN Take by mouth.   hydrochlorothiazide 25 MG tablet Commonly known as: HYDRODIURIL Take 25 mg by mouth daily.   levothyroxine 50 MCG tablet Commonly known as: SYNTHROID Take 50 mcg by mouth daily before breakfast.   LORazepam 1 MG tablet Commonly known as: ATIVAN Take 1 mg by mouth 2 (two) times daily.   metFORMIN 500 MG 24 hr tablet Commonly known as: GLUCOPHAGE-XR Take 1,500 mg by mouth at bedtime.   metoCLOPramide 5 MG tablet Commonly known as: REGLAN Take 5 mg by mouth 3 (three) times daily before meals.   multivitamin with minerals Tabs tablet Take 1 tablet by mouth at bedtime. GERITOL COMPLETE MULTIVITAMIN.   Myrbetriq 50 MG Tb24 tablet Generic drug: mirabegron ER Take 50 mg by mouth daily.   nitrofurantoin 100 MG capsule Commonly known as: Macrodantin Take 1 capsule (100 mg total) by mouth 2 (two) times daily.   ondansetron 4 MG disintegrating tablet Commonly known as: ZOFRAN-ODT Take 4 mg by mouth every 8 (eight) hours as needed for nausea/vomiting.   ondansetron 4 MG tablet Commonly known as: ZOFRAN ondansetron HCl 4 mg  tablet   oxybutynin 10 MG 24 hr tablet Commonly known as: DITROPAN-XL Take 1 tablet (10 mg total) by mouth daily.   Ozempic (0.25 or 0.5 MG/DOSE) 2 MG/1.5ML Sopn Generic drug: Semaglutide(0.25 or 0.5MG/DOS) Inject 0.25 mg into the skin every Sunday.   pantoprazole 40 MG tablet Commonly known as: PROTONIX Take 40 mg by mouth daily.   Phendimetrazine Tartrate 35 MG Tabs Take 105 mg by mouth daily.   solifenacin 5 MG tablet Commonly known as: VESICARE Take 1 tablet (5 mg total) by mouth daily.   Tecfidera 240 MG Cpdr Generic drug: Dimethyl Fumarate Take 240 mg by mouth in the morning and at bedtime.   traMADol 50 MG tablet Commonly known as: ULTRAM Take 50 mg by mouth every 6 (six) hours.   valsartan-hydrochlorothiazide 320-12.5 MG tablet Commonly known as: DIOVAN-HCT Take  1 tablet by mouth daily.       Allergies:  Allergies  Allergen Reactions  . Penicillins Hives    Did it involve swelling of the face/tongue/throat, SOB, or low BP? No Did it involve sudden or severe rash/hives, skin peeling, or any reaction on the inside of your mouth or nose? Yes Did you need to seek medical attention at a hospital or doctor's office? Unknown When did it last happen?20 + years If all above answers are "NO", may proceed with cephalosporin use.    . Sulfa Antibiotics Hives and Nausea And Vomiting  . Ibuprofen Rash    Family History: Family History  Problem Relation Age of Onset  . Breast cancer Paternal Grandmother   . Heart disease Mother   . Kidney failure Father   . Colon cancer Maternal Grandmother     Social History:  reports that she quit smoking about 3 years ago. Her smoking use included cigarettes. She smoked 0.50 packs per day. She has never used smokeless tobacco. She reports that she does not drink alcohol and does not use drugs.  ROS:                                        Physical Exam: There were no vitals taken for this  visit.  Constitutional:  Alert and oriented, No acute distress.  Laboratory Data: Lab Results  Component Value Date   WBC 11.6 (H) 03/01/2020   HGB 10.0 (L) 03/01/2020   HCT 31.3 (L) 03/01/2020   MCV 93.2 03/01/2020   PLT 333 03/01/2020    Lab Results  Component Value Date   CREATININE 0.63 02/29/2020    No results found for: PSA  No results found for: TESTOSTERONE  No results found for: HGBA1C  Urinalysis    Component Value Date/Time   COLORURINE RED (A) 02/29/2020 0130   APPEARANCEUR Cloudy (A) 05/21/2020 0952   LABSPEC 1.025 02/29/2020 0130   PHURINE 6.0 02/29/2020 0130   GLUCOSEU Negative 05/21/2020 0952   HGBUR LARGE (A) 02/29/2020 0130   BILIRUBINUR Negative 05/21/2020 0952   KETONESUR NEGATIVE 02/29/2020 0130   PROTEINUR Negative 05/21/2020 0952   PROTEINUR 100 (A) 02/29/2020 0130   NITRITE Negative 05/21/2020 0952   NITRITE NEGATIVE 02/29/2020 0130   LEUKOCYTESUR 1+ (A) 05/21/2020 0952   LEUKOCYTESUR NEGATIVE 02/29/2020 0130    Pertinent Imaging:   Assessment & Plan: Patient has high-volume incontinence with functional issues and neurogenic bladder.  It may be difficult to reach her treatment goal.  Call if urine culture is positive.  Once a safer treatment and she is going to start with percutaneous tibial nerve stimulation.  She want to call her insurance first and let us know. There are no diagnoses linked to this encounter.  No follow-ups on file.  Reece Packer, MD  [facility] [address] [place], [place] [address] [phone]

## 2020-07-02 NOTE — Patient Instructions (Signed)
Urgent PC office-based treatment for overactive bladder Take back control of your life! Urgent PC is a non-drug, non-surgical option for overactive bladder and associated symptoms of urinary urgency, urinary frequency and urge incontinence  Urgent-PC- Since 2003, healthcare professionals have used the Urgent PC Neuromodulation System as an effective office treatment for men and women suffering from overactive bladder, a condition commonly referred to as OAB. Urgent PC is up to 80% effective, even after conservative measures and OAB drugs have failed. Plus, Urgent PC is very low risk, making it a great choice for people unable or unwilling to have more invasive procedures.  How does Urgent PC work?  The Urgent PC system delivers a specific type of neuromodulation called percutaneous tibial nerve stimulation (PTNS). During treatment, a small, slim needle electrode is inserted near your ankle. The needle electrode is then connected to the battery-powered stimulator. During your 30-minute treatment, mild impulses from the stimulator travel through the needle electrode, along your leg and to the nerves in your pelvis that control bladder function. This process is also referred to as neuromodulation.  What will I feel with Urgent PC therapy?  Because patients may experience the sensation of the Urgent PC therapy in different ways, it's difficult to say what the treatment would feel like to you. Patients often describe the sensation as "tingling" or "pulsating." Treatment is typically well-tolerated by patients. Urgent PC offers many different levels of stimulation, so your clinician will be able to adjust treatment to suit you as well as address any discomfort that you might experience during treatment.  How often will I need Urgent PC treatments? You will receive an initial series of 12 treatments scheduled about a week apart. If you respond, you will likely need a treatment about once per month to  maintain your improvements.  How soon will I see results with Urgent PC? Because Urgent PC gently modifies the signals to achieve bladder control, it usually takes 5-7 weeks for symptoms to change. However, patients respond at different rates. In a review of about 100 patients who had success with Urgent PC, symptoms improved anywhere between 2-12 weeks. For about 20% of these patients, the symptoms of urgency and/or urge incontinence didn't improve until after 8 weeks.1  There is no way to anticipate who will respond earlier, later or not at all. That's why it is important to receive the 12 recommended treatments before you and your physician evaluate whether this therapy is an appropriate and effective choice for you.  How can I receive treatment with Urgent PC? Urgent PC is an option for patients with OAB. If you think you have OAB, talk to your doctor, a urologist or urogynecologist. If you have OAB, the doctor will work with you to determine your own personal treatment plan which usually starts with behavior and diet modifications plus medications. Urgent PC is an excellent option if these options don't work or provide sufficient improvements. Treatment with Urgent PC is typically performed at the office of a urologist, urogynecologist or gynecologist. So, if you run out of options with your normal doctor, consider visiting one of these specialists.  Does insurance cover Urgent PC? Urgent PC treatment is reimbursed by Medicare across the [place]. Private insurance coverage varies by state. To see if your insurance company covers Urgent PC, use our Coverage Finder or talk to your Healthcare Provider.  Are there patients who should not be treated with Urgent PC? Yes, these include: patients with pacemakers or implantable defibrillators, patients prone  to excessive bleeding, patients with nerve damage that could impact either percutaneous tibial nerve or pelvic floor function and patients who  are pregnant or planning to become pregnant during the duration of the treatment.  What are the risks associated with Urgent PC? The risks associated with Urgent PC therapy are low. Most common side-effects are temporary and include mild pain or skin inflammation at or near the stimulation site.

## 2020-07-03 ENCOUNTER — Telehealth: Payer: Self-pay | Admitting: *Deleted

## 2020-07-03 NOTE — Telephone Encounter (Signed)
Patient called in today and we talk with [employer] and they said 200.00 per treatment. Patient might try botox

## 2020-07-03 NOTE — Telephone Encounter (Signed)
Approved for PTNS

## 2020-07-03 NOTE — Telephone Encounter (Signed)
Patient would like the prices on botox, , she could not do PTNS because 200.00 per treatment.

## 2020-07-05 LAB — CULTURE, URINE COMPREHENSIVE

## 2020-07-09 ENCOUNTER — Telehealth: Payer: Self-pay

## 2020-07-09 NOTE — Telephone Encounter (Signed)
Incoming call on triage line. Patient calling to inquire about some information that was supposed to be sent to her about Botox since she cant get the PTNS treatments. Please advise.

## 2020-07-10 ENCOUNTER — Telehealth: Payer: Self-pay

## 2020-07-10 NOTE — Telephone Encounter (Signed)
Pt calls requesting update on Botox coverage.

## 2020-07-10 NOTE — Telephone Encounter (Signed)
ERROR

## 2020-07-10 NOTE — Telephone Encounter (Signed)
Spoke with Lexine Baton at [facility] approved 1 year #[id number] for Botox injection.   Left VM for paiten asked to return call.

## 2020-07-12 NOTE — Telephone Encounter (Signed)
Called patient to schedule Botox, left vmail for patient to return call. Did book procedure and UA drop off prior will reschedule if dates do not work for patient

## 2020-07-13 ENCOUNTER — Other Ambulatory Visit: Payer: Self-pay

## 2020-07-13 ENCOUNTER — Other Ambulatory Visit: Payer: Medicare HMO

## 2020-07-13 DIAGNOSIS — R32 Unspecified urinary incontinence: Secondary | ICD-10-CM

## 2020-07-16 ENCOUNTER — Ambulatory Visit: Payer: Self-pay | Admitting: Urology

## 2020-07-16 LAB — MICROSCOPIC EXAMINATION: Bacteria, UA: NONE SEEN

## 2020-07-16 LAB — URINALYSIS, COMPLETE
Bilirubin, UA: NEGATIVE
Glucose, UA: NEGATIVE
Ketones, UA: NEGATIVE
Leukocytes,UA: NEGATIVE
Nitrite, UA: NEGATIVE
Protein,UA: NEGATIVE
RBC, UA: NEGATIVE
Specific Gravity, UA: 1.02 (ref 1.005–1.030)
Urobilinogen, Ur: 0.2 mg/dL (ref 0.2–1.0)
pH, UA: 8.5 — ABNORMAL HIGH (ref 5.0–7.5)

## 2020-07-16 MED ORDER — CIPROFLOXACIN HCL 250 MG PO TABS
250.0000 mg | ORAL_TABLET | Freq: Two times a day (BID) | ORAL | 0 refills | Status: DC
Start: 2020-07-16 — End: 2020-10-08

## 2020-07-16 NOTE — Addendum Note (Signed)
Addended by: Tommy Rainwater on: 07/16/2020 02:49 PM   Modules accepted: Orders

## 2020-07-16 NOTE — Telephone Encounter (Signed)
Patient called back and is agreeable with dates. Will drop off UA for culture and pre-antibiotics were sent the the pharmacy for patient to start the day before procedure

## 2020-07-17 ENCOUNTER — Ambulatory Visit: Payer: Medicare HMO

## 2020-07-17 LAB — CULTURE, URINE COMPREHENSIVE

## 2020-07-23 ENCOUNTER — Other Ambulatory Visit: Payer: Self-pay

## 2020-07-23 ENCOUNTER — Ambulatory Visit: Payer: Medicare HMO | Admitting: Urology

## 2020-07-23 ENCOUNTER — Encounter: Payer: Self-pay | Admitting: Urology

## 2020-07-23 VITALS — BP 127/85 | HR 92 | Ht 65.0 in | Wt 190.0 lb

## 2020-07-23 DIAGNOSIS — N3946 Mixed incontinence: Secondary | ICD-10-CM

## 2020-07-23 DIAGNOSIS — R32 Unspecified urinary incontinence: Secondary | ICD-10-CM | POA: Diagnosis not present

## 2020-07-23 MED ORDER — FLUCONAZOLE 100 MG PO TABS: 100.0000 mg | ORAL_TABLET | Freq: Every day | ORAL | 0 refills | Status: DC

## 2020-07-23 MED ORDER — ONABOTULINUMTOXINA 100 UNITS IJ SOLR
100.0000 [IU] | Freq: Once | INTRAMUSCULAR | Status: AC
  Administered 2020-07-23: 100 [IU] via INTRAMUSCULAR

## 2020-07-23 NOTE — Progress Notes (Signed)
07/23/2020 1:31 PM   Ashlee Fields 06/10/1956 [id number]  Referring provider: [name], MD [facility] [place],  [place] [address]  Chief Complaint  Patient presents with  . Botulinum Toxin Injection    HPI: Dr [name]:Cleared for hematuria with nonobstructing stone right kidneywith negative cystoscopy.  I was consulted to assist the patient is urinary incontinence. She has urge incontinence especially after sitting for a few hours. She can soak a double pad system with high-volume leakage. She changes this I think twice a day. Some of the details were more difficult. She denies stress incontinence and bedwetting. She is 20% improved on Myrbetriq  She thinks she has had 3 bladder infections recently but was nonspecific and the only symptom was agitation. She had a total knee and had the Purewickthis to him in the hospital. Urine culture in April normal. I did not see other positive cultures    Patient has urge incontinence high-volume triggered when she goes from a sitting to standing position. Reassess in 6 weeks on oxybutynin ER 10 mg and Vesicare 5 mg prescriptions. She tried another antimuscarinic I believe but did not know the name. I   Last urine culture positive.  Blo Urge incontinence persisting.  High-volume leakage persistent.  Triggered when she goes from sitting to standing position.  Urine chronically orange-colored.  She has multiple sclerosis and her left side weakness with decreased functionality because of it.  No blood thinner pacemaker  I went over all 3 refractory treatments with her and went through full template.  The conversation became a little bit circular.  We talked about ordering urodynamics but it was reasonable not to but she understands retention rate with Botox with multiple sclerosis could approach 30% and she may or may not be able to perform self-catheterization versus a Foley catheter for a  few weeks at a time with subsequent trials of voiding.  She was concerned about Covid virus exposure coming for percutaneous tibial nerve stimulation.   Patient has high-volume incontinence with functional issues and neurogenic bladder.  It may be difficult to reach her treatment goal.  Call if urine culture is positive.  Once a safer treatment and she is going to start with percutaneous tibial nerve stimulation.  She want to call her insurance first and let us know.  TOday Frequency stable.  Incontinence stable.  Apparently percutaneous tibial nerve stimulation was too expensive.  Patient clinically was not infected and underwent cystoscopy and botulinum toxin injection.  100 units in 10 cc of normal saline was utilized.  Middle of the bladder template utilized.  1 cc injections with 10 units of Botox x10.  She tolerated it well.  Initial cystoscopy demonstrated normal trigone and bladder mucosa.  Small cystocele noted.  No bleeding.   PMH: Past Medical History:  Diagnosis Date  . Anxiety   . Arthritis   . Depression   . Diabetes mellitus without complication ([place])   . GERD (gastroesophageal reflux disease)   . Hypertension   . Hypothyroidism   . Multiple sclerosis ([place]) 1980  . Osteoporosis   . Sleep apnea   . Vision abnormalities     Surgical History: Past Surgical History:  Procedure Laterality Date  . CESAREAN SECTION    . COLONOSCOPY    . JOINT REPLACEMENT    . REPLACEMENT TOTAL KNEE Left 2009  . RIGHT HEART CATH Right 08/05/2017   Procedure: RIGHT HEART CATH;  Surgeon: Teodoro Spray, MD;  Location: [place] [facility];  Service: Cardiovascular;  Laterality: Right;  . TOTAL KNEE ARTHROPLASTY Right 02/28/2020   Procedure: TOTAL KNEE ARTHROPLASTY;  Surgeon: Thornton Park, MD;  Location: [facility] OR[facility];  Service: Orthopedics;  Laterality: Right;    Home Medications:  Allergies as of 07/23/2020      Reactions   Penicillins Hives   Did it involve swelling of the  face/tongue/throat, SOB, or low BP? No Did it involve sudden or severe rash/hives, skin peeling, or any reaction on the inside of your mouth or nose? Yes Did you need to seek medical attention at a hospital or doctor's office? Unknown When did it last happen?20 + years If all above answers are "NO", may proceed with cephalosporin use.   Sulfa Antibiotics Hives, Nausea And Vomiting   Ibuprofen Rash      Medication List       Accurate as of July 23, 2020  1:31 PM. If you have any questions, ask your nurse or doctor.        acetaminophen 500 MG tablet Commonly known as: TYLENOL Take 2 tablets (1,000 mg total) by mouth 3 (three) times daily.   alendronate 70 MG tablet Commonly known as: FOSAMAX Take 70 mg by mouth once a week. Take with a full glass of water on an empty stomach.  sunday   atorvastatin 80 MG tablet Commonly known as: LIPITOR Take 80 mg by mouth at bedtime.   baclofen 20 MG tablet Commonly known as: LIORESAL Take 40 mg by mouth at bedtime.   ciprofloxacin 250 MG tablet Commonly known as: Cipro Take 1 tablet (250 mg total) by mouth 2 (two) times daily. Take one tablet twice daily the day before, the day of, and the day after the procedure   cyanocobalamin 1000 MCG/ML injection Commonly known as: (VITAMIN B-12) Inject 1,000 mcg into the muscle every 30 (thirty) days.   diazepam 2 MG tablet Commonly known as: VALIUM Take 2 mg by mouth 4 (four) times daily as needed for muscle spasms.   famotidine 40 MG tablet Commonly known as: PEPCID Take 1 tablet (40 mg total) by mouth at bedtime.   FLUoxetine 20 MG capsule Commonly known as: PROZAC Take 60 mg by mouth daily.   gabapentin 100 MG capsule Commonly known as: NEURONTIN Take by mouth.   levothyroxine 50 MCG tablet Commonly known as: SYNTHROID Take 50 mcg by mouth daily before breakfast.   LORazepam 1 MG tablet Commonly known as: ATIVAN Take 1 mg by mouth 2 (two) times daily.   metFORMIN  500 MG 24 hr tablet Commonly known as: GLUCOPHAGE-XR Take 1,500 mg by mouth at bedtime.   multivitamin with minerals Tabs tablet Take 1 tablet by mouth at bedtime. GERITOL COMPLETE MULTIVITAMIN.   ondansetron 4 MG disintegrating tablet Commonly known as: ZOFRAN-ODT Take 4 mg by mouth every 8 (eight) hours as needed for nausea/vomiting.   oxybutynin 10 MG 24 hr tablet Commonly known as: DITROPAN-XL Take 1 tablet (10 mg total) by mouth daily.   Ozempic (0.25 or 0.5 MG/DOSE) 2 MG/1.5ML Sopn Generic drug: Semaglutide(0.25 or 0.5MG/DOS) Inject 0.25 mg into the skin every Sunday.   pantoprazole 40 MG tablet Commonly known as: PROTONIX Take 40 mg by mouth daily.   Phendimetrazine Tartrate 35 MG Tabs Take 105 mg by mouth daily.   Tecfidera 240 MG Cpdr Generic drug: Dimethyl Fumarate Take 240 mg by mouth in the morning and at bedtime.   valsartan-hydrochlorothiazide 320-12.5 MG tablet Commonly known as: DIOVAN-HCT Take 1  tablet by mouth daily.       Allergies:  Allergies  Allergen Reactions  . Penicillins Hives    Did it involve swelling of the face/tongue/throat, SOB, or low BP? No Did it involve sudden or severe rash/hives, skin peeling, or any reaction on the inside of your mouth or nose? Yes Did you need to seek medical attention at a hospital or doctor's office? Unknown When did it last happen?20 + years If all above answers are "NO", may proceed with cephalosporin use.    . Sulfa Antibiotics Hives and Nausea And Vomiting  . Ibuprofen Rash    Family History: Family History  Problem Relation Age of Onset  . Breast cancer Paternal Grandmother   . Heart disease Mother   . Kidney failure Father   . Colon cancer Maternal Grandmother     Social History:  reports that she quit smoking about 3 years ago. Her smoking use included cigarettes. She smoked 0.50 packs per day. She has never used smokeless tobacco. She reports that she does not drink alcohol and does  not use drugs.  ROS:                                        Physical Exam: BP 127/85   Pulse 92   Ht _0  (1.651 m)   Wt 86.2 kg   BMI 31.62 kg/m   Constitutional:  Alert and oriented, No acute distress. HEENT: [place] AT, moist mucus membranes.  Trachea midline, no masses. Cardiovascular: No clubbing, cyanosis, or edema. Respiratory: Normal respiratory effort, no increased work of breathing. GI: Abdomen is soft, nontender, nondistended, no abdominal masses GU: No CVA tenderness.  Skin: No rashes, bruises or suspicious lesions. Lymph: No cervical or inguinal adenopathy. Neurologic: Grossly intact, no focal deficits, moving all 4 extremities. Psychiatric: Normal mood and affect.  Laboratory Data: Lab Results  Component Value Date   WBC 11.6 (H) 03/01/2020   HGB 10.0 (L) 03/01/2020   HCT 31.3 (L) 03/01/2020   MCV 93.2 03/01/2020   PLT 333 03/01/2020    Lab Results  Component Value Date   CREATININE 0.63 02/29/2020    No results found for: PSA  No results found for: TESTOSTERONE  No results found for: HGBA1C  Urinalysis    Component Value Date/Time   COLORURINE RED (A) 02/29/2020 0130   APPEARANCEUR Cloudy (A) 07/13/2020 1305   LABSPEC 1.025 02/29/2020 0130   PHURINE 6.0 02/29/2020 0130   GLUCOSEU Negative 07/13/2020 1305   HGBUR LARGE (A) 02/29/2020 0130   BILIRUBINUR Negative 07/13/2020 1305   KETONESUR NEGATIVE 02/29/2020 0130   PROTEINUR Negative 07/13/2020 1305   PROTEINUR 100 (A) 02/29/2020 0130   NITRITE Negative 07/13/2020 1305   NITRITE NEGATIVE 02/29/2020 0130   LEUKOCYTESUR Negative 07/13/2020 1305   LEUKOCYTESUR NEGATIVE 02/29/2020 0130    Pertinent Imaging:   Assessment & Plan: Patient will be followed as per protocol.  She is at high risk of retention with a neurogenic bladder.  She is higher risk of lack of efficacy due to the functionality.  She did have some white discharge that may have been associated with vaginal  itchiness so we called in Diflucan 100 mg daily for 3 days.  There are no diagnoses linked to this encounter.  No follow-ups on file.  Reece Packer, MD  [facility] [address], [place], [place] [address] [phone]

## 2020-07-24 ENCOUNTER — Ambulatory Visit: Payer: Medicare HMO

## 2020-07-31 ENCOUNTER — Ambulatory Visit: Payer: Medicare HMO

## 2020-08-06 ENCOUNTER — Telehealth (INDEPENDENT_AMBULATORY_CARE_PROVIDER_SITE_OTHER): Payer: Medicare HMO | Admitting: Urology

## 2020-08-06 ENCOUNTER — Ambulatory Visit: Payer: Medicare HMO | Admitting: Physician Assistant

## 2020-08-06 DIAGNOSIS — N3946 Mixed incontinence: Secondary | ICD-10-CM | POA: Diagnosis not present

## 2020-08-06 NOTE — Progress Notes (Signed)
This service is provided via telemedicine   No vital signs collected/recorded due to the encounter was a telemedicine visit.     Patient consents to a telephone visit:  Yes    Names of all persons participating in the telemedicine service and their role in the encounter:  Kerman Passey, RMA

## 2020-08-06 NOTE — Progress Notes (Signed)
Virtual Visit via Telephone Note  I connected with Ashlee Fields on 08/06/20 at  3:15 PM EDT by telephone and verified that I am speaking with the correct person using two identifiers.   I discussed the limitations, risks, security and privacy concerns of performing an evaluation and management service by telephone and the availability of in person appointments. We discussed the impact of the COVID-19 pandemic on the healthcare system, and the importance of social distancing and reducing patient and provider exposure. I also discussed with the patient that there may be a patient responsible charge related to this service. The patient expressed understanding and agreed to proceed.  Reason for visit: Reviewed note in detail.  Frequency stable.  Urge incontinence much better.  Completely dry.  No high-volume episodes.  Flow stable.  Clinically not infected  History of Present Illness:   Assessment and Plan: Patient will stop oxybutynin.  She will go back on it if incontinence returns.  Otherwise reevaluate in 5 months   Follow Up:    I discussed the assessment and treatment plan with the patient. The patient was provided an opportunity to ask questions and all were answered. The patient agreed with the plan and demonstrated an understanding of the instructions.   The patient was advised to call back or seek an in-person evaluation if the symptoms worsen or if the condition fails to improve as anticipated.  I provided 10 minutes of non-face-to-face time during this encounter.   Reece Packer, MD

## 2020-08-07 ENCOUNTER — Ambulatory Visit: Payer: Medicare HMO

## 2020-08-14 ENCOUNTER — Other Ambulatory Visit: Payer: Self-pay | Admitting: Internal Medicine

## 2020-08-14 ENCOUNTER — Ambulatory Visit: Payer: Medicare HMO

## 2020-08-14 DIAGNOSIS — Z1231 Encounter for screening mammogram for malignant neoplasm of breast: Secondary | ICD-10-CM

## 2020-08-21 ENCOUNTER — Ambulatory Visit: Payer: Medicare HMO

## 2020-08-28 ENCOUNTER — Ambulatory Visit: Payer: Medicare HMO

## 2020-09-04 ENCOUNTER — Ambulatory Visit: Payer: Medicare HMO

## 2020-09-06 ENCOUNTER — Ambulatory Visit: Payer: Medicare HMO | Admitting: Urology

## 2020-09-11 ENCOUNTER — Ambulatory Visit: Payer: Medicare HMO

## 2020-09-18 ENCOUNTER — Ambulatory Visit: Payer: Medicare HMO

## 2020-09-25 ENCOUNTER — Ambulatory Visit: Payer: Medicare HMO

## 2020-10-02 ENCOUNTER — Ambulatory Visit: Payer: Medicare HMO

## 2020-10-04 ENCOUNTER — Ambulatory Visit: Payer: Self-pay | Admitting: Physician Assistant

## 2020-10-08 ENCOUNTER — Encounter: Payer: Self-pay | Admitting: Urology

## 2020-10-08 ENCOUNTER — Other Ambulatory Visit: Payer: Self-pay

## 2020-10-08 ENCOUNTER — Ambulatory Visit (INDEPENDENT_AMBULATORY_CARE_PROVIDER_SITE_OTHER): Payer: Medicare HMO | Admitting: Urology

## 2020-10-08 VITALS — BP 143/49 | HR 82

## 2020-10-08 DIAGNOSIS — N3946 Mixed incontinence: Secondary | ICD-10-CM | POA: Diagnosis not present

## 2020-10-08 MED ORDER — ESTRADIOL 0.1 MG/GM VA CREA: TOPICAL_CREAM | VAGINAL | 12 refills | Status: DC

## 2020-10-08 NOTE — Progress Notes (Signed)
10/08/2020 11:35 AM   Ashlee Fields 23-Dec-1955 [id number]  Referring provider: [name], MD [facility] [place],  [place] [address]  Chief Complaint  Patient presents with  . Follow-up    HPI: Dr [name]:Cleared for hematuria with nonobstructing stone right kidneywith negative cystoscopy.  I was consulted to assist the patient is urinary incontinence. She has urge incontinence especially after sitting for a few hours. She can soak a double pad system with high-volume leakage. She changes this I think twice a day. Some of the details were more difficult. She denies stress incontinence and bedwetting. She is 20% improved on Myrbetriq  She thinks she has had 3 bladder infections recently but was nonspecific and the only symptom was agitation. She had a total knee and had the Purewickthis to him in the hospital. Urine culture in April normal. I did not see other positive cultures   Patient has urge incontinence high-volume triggered when she goes from a sitting to standing position. Reassess in 6 weeks on oxybutynin ER 10 mg and Vesicare 5 mg prescriptions. She tried another antimuscarinic I believe but did not know the name. I  Last urine culture positive.  Urge incontinence persisting. High-volume leakage persistent. Triggered when she goes from sitting to standing position. Urine chronically orange-colored.  She has multiple sclerosis and her left side weakness with decreased functionality because of it.   I went over all 3 refractory treatments with her and went through full template. The conversation became a little bit circular. We talked about ordering urodynamics but it was reasonable not to but she understands retention rate with Botox with multiple sclerosis could approach 30% and she may or may not be able to perform self-catheterization versus a Foley catheter for a few weeks at a time with subsequent trials of  voiding. She was concerned about Covid virus exposure coming for percutaneous tibial nerve stimulation.   Patient has high-volume incontinence with functional issues and neurogenic bladder. It may be difficult to reach her treatment goal. Call if urine culture is positive.Once a safer treatment and she is going to start with percutaneous tibial nerve stimulation. She want to call her insurance first and let us know.   Apparently percutaneous tibial nerve stimulation was too expensive.  Had Botox treatment July 23, 2020  Patient will be followed as per protocol.  She is at high risk of retention with a neurogenic bladder.  She is higher risk of lack of efficacy due to the functionality.  She did have some white discharge that may have been associated with vaginal itchiness so we called in Diflucan 100 mg daily for 3 days.  Day Consider stable More than 90% improved.  Starting to have infrequent gastritis but would like to delay till January.  I gave her 3-day prescription of ciprofloxacin She has achiness or discomfort in the vagina.  She said it ever since I was examining her or perhaps Betadine.  She does not necessarily report vaginal dryness. She has been reporting vaginal irritation the past and was encouraged to use the vaginocele but was never given cream.   PMH: Past Medical History:  Diagnosis Date  . Anxiety   . Arthritis   . Depression   . Diabetes mellitus without complication ([place])   . GERD (gastroesophageal reflux disease)   . Hypertension   . Hypothyroidism   . Multiple sclerosis ([place]) 1980  . Osteoporosis   . Sleep apnea   . Vision abnormalities  Surgical History: Past Surgical History:  Procedure Laterality Date  . CESAREAN SECTION    . COLONOSCOPY    . JOINT REPLACEMENT    . REPLACEMENT TOTAL KNEE Left 2009  . RIGHT HEART CATH Right 08/05/2017   Procedure: RIGHT HEART CATH;  Surgeon: Teodoro Spray, MD;  Location: [place] [facility];  Service:  Cardiovascular;  Laterality: Right;  . TOTAL KNEE ARTHROPLASTY Right 02/28/2020   Procedure: TOTAL KNEE ARTHROPLASTY;  Surgeon: Thornton Park, MD;  Location: [facility];  Service: Orthopedics;  Laterality: Right;    Home Medications:  Allergies as of 10/08/2020      Reactions   Penicillins Hives   Did it involve swelling of the face/tongue/throat, SOB, or low BP? No Did it involve sudden or severe rash/hives, skin peeling, or any reaction on the inside of your mouth or nose? Yes Did you need to seek medical attention at a hospital or doctor's office? Unknown When did it last happen?20 + years If all above answers are "NO", may proceed with cephalosporin use.   Sulfa Antibiotics Hives, Nausea And Vomiting   Ibuprofen Rash      Medication List       Accurate as of October 08, 2020 11:35 AM. If you have any questions, ask your nurse or doctor.        STOP taking these medications   ciprofloxacin 250 MG tablet Commonly known as: Cipro Stopped by: Reece Packer, MD   fluconazole 100 MG tablet Commonly known as: DIFLUCAN Stopped by: Reece Packer, MD   multivitamin with minerals Tabs tablet Stopped by: Reece Packer, MD   oxybutynin 10 MG 24 hr tablet Commonly known as: DITROPAN-XL Stopped by: Reece Packer, MD     TAKE these medications   acetaminophen 500 MG tablet Commonly known as: TYLENOL Take 2 tablets (1,000 mg total) by mouth 3 (three) times daily.   alendronate 70 MG tablet Commonly known as: FOSAMAX Take 70 mg by mouth once a week. Take with a full glass of water on an empty stomach.  sunday   atorvastatin 80 MG tablet Commonly known as: LIPITOR Take 80 mg by mouth at bedtime.   baclofen 20 MG tablet Commonly known as: LIORESAL Take 40 mg by mouth at bedtime.   cyanocobalamin 1000 MCG/ML injection Commonly known as: (VITAMIN B-12) Inject 1,000 mcg into the muscle every 30 (thirty) days.   diazepam 2 MG tablet Commonly  known as: VALIUM Take 2 mg by mouth 4 (four) times daily as needed for muscle spasms.   famotidine 40 MG tablet Commonly known as: PEPCID Take 1 tablet (40 mg total) by mouth at bedtime.   FLUoxetine 20 MG capsule Commonly known as: PROZAC Take 60 mg by mouth daily.   gabapentin 100 MG capsule Commonly known as: NEURONTIN Take by mouth.   levothyroxine 50 MCG tablet Commonly known as: SYNTHROID Take 50 mcg by mouth daily before breakfast.   LORazepam 1 MG tablet Commonly known as: ATIVAN Take 1 mg by mouth 2 (two) times daily.   metFORMIN 500 MG 24 hr tablet Commonly known as: GLUCOPHAGE-XR Take 1,500 mg by mouth at bedtime.   nitrofurantoin (macrocrystal-monohydrate) 100 MG capsule Commonly known as: MACROBID Take 100 mg by mouth 2 (two) times daily.   ondansetron 4 MG disintegrating tablet Commonly known as: ZOFRAN-ODT Take 4 mg by mouth every 8 (eight) hours as needed for nausea/vomiting.   Ozempic (0.25 or 0.5 MG/DOSE) 2 MG/1.5ML Sopn Generic drug: Semaglutide(0.25 or  0.5MG/DOS) Inject 0.25 mg into the skin every Sunday.   pantoprazole 40 MG tablet Commonly known as: PROTONIX Take 40 mg by mouth daily.   Phendimetrazine Tartrate 35 MG Tabs Take 105 mg by mouth daily.   Tecfidera 240 MG Cpdr Generic drug: Dimethyl Fumarate Take 240 mg by mouth in the morning and at bedtime.   valsartan-hydrochlorothiazide 320-12.5 MG tablet Commonly known as: DIOVAN-HCT Take 1 tablet by mouth daily.       Allergies:  Allergies  Allergen Reactions  . Penicillins Hives    Did it involve swelling of the face/tongue/throat, SOB, or low BP? No Did it involve sudden or severe rash/hives, skin peeling, or any reaction on the inside of your mouth or nose? Yes Did you need to seek medical attention at a hospital or doctor's office? Unknown When did it last happen?20 + years If all above answers are "NO", may proceed with cephalosporin use.    . Sulfa Antibiotics  Hives and Nausea And Vomiting  . Ibuprofen Rash    Family History: Family History  Problem Relation Age of Onset  . Breast cancer Paternal Grandmother   . Heart disease Mother   . Kidney failure Father   . Colon cancer Maternal Grandmother     Social History:  reports that she quit smoking about 3 years ago. Her smoking use included cigarettes. She smoked 0.50 packs per day. She has never used smokeless tobacco. She reports that she does not drink alcohol and does not use drugs.  ROS:                                        Physical Exam: BP (!) 143/49   Pulse 82   Constitutional:  Alert and oriented, No acute distress.   Laboratory Data: Lab Results  Component Value Date   WBC 11.6 (H) 03/01/2020   HGB 10.0 (L) 03/01/2020   HCT 31.3 (L) 03/01/2020   MCV 93.2 03/01/2020   PLT 333 03/01/2020    Lab Results  Component Value Date   CREATININE 0.63 02/29/2020    No results found for: PSA  No results found for: TESTOSTERONE  No results found for: HGBA1C  Urinalysis    Component Value Date/Time   COLORURINE RED (A) 02/29/2020 0130   APPEARANCEUR Cloudy (A) 07/13/2020 1305   LABSPEC 1.025 02/29/2020 0130   PHURINE 6.0 02/29/2020 0130   GLUCOSEU Negative 07/13/2020 1305   HGBUR LARGE (A) 02/29/2020 0130   BILIRUBINUR Negative 07/13/2020 1305   KETONESUR NEGATIVE 02/29/2020 0130   PROTEINUR Negative 07/13/2020 1305   PROTEINUR 100 (A) 02/29/2020 0130   NITRITE Negative 07/13/2020 1305   NITRITE NEGATIVE 02/29/2020 0130   LEUKOCYTESUR Negative 07/13/2020 1305   LEUKOCYTESUR NEGATIVE 02/29/2020 0130    Pertinent Imaging:   Assessment & Plan: 3-day prescription given.  Botox in January.  If she starts having a lot of incontinence she can always call earlier.  She will try vaginal estrogen cream 3 nights a week for 1 month then once weekly.  Samples and prescription given.  Estrogen history discussed.  Call if urine culture is positive.   Botox in January  1. Mixed incontinence  - Urinalysis, Complete   No follow-ups on file.  Reece Packer, MD  [facility] [address], [place], [place] [address] [phone]

## 2020-10-09 LAB — URINALYSIS, COMPLETE
Bilirubin, UA: NEGATIVE
Glucose, UA: NEGATIVE
Ketones, UA: NEGATIVE
Nitrite, UA: NEGATIVE
Protein,UA: NEGATIVE
Specific Gravity, UA: 1.02 (ref 1.005–1.030)
Urobilinogen, Ur: 0.2 mg/dL (ref 0.2–1.0)
pH, UA: 8.5 — ABNORMAL HIGH (ref 5.0–7.5)

## 2020-10-09 LAB — MICROSCOPIC EXAMINATION: WBC, UA: >30 /hpf — AB (ref 0–5)

## 2020-10-11 LAB — CULTURE, URINE COMPREHENSIVE

## 2020-10-12 ENCOUNTER — Other Ambulatory Visit: Payer: Self-pay | Admitting: *Deleted

## 2020-10-12 ENCOUNTER — Telehealth: Payer: Self-pay | Admitting: Family Medicine

## 2020-10-12 MED ORDER — NITROFURANTOIN MACROCRYSTAL 100 MG PO CAPS: 100.0000 mg | ORAL_CAPSULE | Freq: Two times a day (BID) | ORAL | 0 refills | Status: AC

## 2020-10-12 NOTE — Telephone Encounter (Signed)
Patient called and states she saw the urine culture result and wants to know if you are going to call in a ABX. Please advise

## 2020-10-19 ENCOUNTER — Other Ambulatory Visit: Payer: Self-pay

## 2020-10-19 ENCOUNTER — Other Ambulatory Visit
Admission: RE | Admit: 2020-10-19 | Discharge: 2020-10-19 | Disposition: A | Discharge status: Home / Self Care | Payer: Medicare HMO | Source: Ambulatory Visit | Attending: Gastroenterology | Admitting: Gastroenterology

## 2020-10-19 DIAGNOSIS — Z01818 Encounter for other preprocedural examination: Secondary | ICD-10-CM | POA: Insufficient documentation

## 2020-10-19 DIAGNOSIS — Z20822 Contact with and (suspected) exposure to covid-19: Secondary | ICD-10-CM | POA: Insufficient documentation

## 2020-10-20 LAB — SARS CORONAVIRUS 2 (TAT 6-24 HRS): SARS Coronavirus 2: NEGATIVE

## 2020-11-15 ENCOUNTER — Ambulatory Visit
Admission: RE | Admit: 2020-11-15 | Discharge: 2020-11-15 | Disposition: A | Discharge status: Home / Self Care | Payer: Medicare HMO | Source: Ambulatory Visit | Attending: Internal Medicine | Admitting: Internal Medicine

## 2020-11-15 ENCOUNTER — Other Ambulatory Visit: Payer: Self-pay

## 2020-11-15 DIAGNOSIS — Z1231 Encounter for screening mammogram for malignant neoplasm of breast: Secondary | ICD-10-CM | POA: Insufficient documentation

## 2020-11-16 ENCOUNTER — Other Ambulatory Visit: Payer: Medicare HMO

## 2020-11-19 ENCOUNTER — Other Ambulatory Visit: Payer: Self-pay

## 2020-11-19 ENCOUNTER — Other Ambulatory Visit
Admission: RE | Admit: 2020-11-19 | Discharge: 2020-11-19 | Disposition: A | Discharge status: Home / Self Care | Payer: Medicare HMO | Source: Ambulatory Visit | Attending: Gastroenterology | Admitting: Gastroenterology

## 2020-11-19 DIAGNOSIS — Z20822 Contact with and (suspected) exposure to covid-19: Secondary | ICD-10-CM | POA: Diagnosis not present

## 2020-11-19 DIAGNOSIS — Z01812 Encounter for preprocedural laboratory examination: Secondary | ICD-10-CM | POA: Diagnosis present

## 2020-11-19 LAB — SARS CORONAVIRUS 2 (TAT 6-24 HRS): SARS Coronavirus 2: NEGATIVE

## 2020-11-20 ENCOUNTER — Ambulatory Visit
Admission: RE | Admit: 2020-11-20 | Discharge: 2020-11-20 | Disposition: A | Discharge status: Home / Self Care | Payer: Medicare HMO | Attending: Gastroenterology | Admitting: Gastroenterology

## 2020-11-20 ENCOUNTER — Ambulatory Visit: Payer: Medicare HMO | Admitting: Anesthesiology

## 2020-11-20 ENCOUNTER — Encounter
Admission: RE | Disposition: A | Discharge status: Home / Self Care | Payer: Self-pay | Source: Home / Self Care | Attending: Gastroenterology

## 2020-11-20 DIAGNOSIS — Z800 Family history of malignant neoplasm of digestive organs: Secondary | ICD-10-CM | POA: Diagnosis not present

## 2020-11-20 DIAGNOSIS — G35 Multiple sclerosis: Secondary | ICD-10-CM | POA: Diagnosis not present

## 2020-11-20 DIAGNOSIS — Z886 Allergy status to analgesic agent status: Secondary | ICD-10-CM | POA: Diagnosis not present

## 2020-11-20 DIAGNOSIS — Z79899 Other long term (current) drug therapy: Secondary | ICD-10-CM | POA: Diagnosis not present

## 2020-11-20 DIAGNOSIS — K219 Gastro-esophageal reflux disease without esophagitis: Secondary | ICD-10-CM | POA: Diagnosis not present

## 2020-11-20 DIAGNOSIS — I10 Essential (primary) hypertension: Secondary | ICD-10-CM | POA: Diagnosis not present

## 2020-11-20 DIAGNOSIS — Z882 Allergy status to sulfonamides status: Secondary | ICD-10-CM | POA: Insufficient documentation

## 2020-11-20 DIAGNOSIS — R1310 Dysphagia, unspecified: Secondary | ICD-10-CM | POA: Diagnosis not present

## 2020-11-20 DIAGNOSIS — Z7952 Long term (current) use of systemic steroids: Secondary | ICD-10-CM | POA: Insufficient documentation

## 2020-11-20 DIAGNOSIS — E119 Type 2 diabetes mellitus without complications: Secondary | ICD-10-CM | POA: Insufficient documentation

## 2020-11-20 DIAGNOSIS — Z880 Allergy status to penicillin: Secondary | ICD-10-CM | POA: Insufficient documentation

## 2020-11-20 DIAGNOSIS — Z7984 Long term (current) use of oral hypoglycemic drugs: Secondary | ICD-10-CM | POA: Diagnosis not present

## 2020-11-20 HISTORY — PX: ESOPHAGOGASTRODUODENOSCOPY (EGD) WITH PROPOFOL: SHX5813

## 2020-11-20 LAB — GLUCOSE, CAPILLARY: Glucose-Capillary: 146 mg/dL — ABNORMAL HIGH (ref 70–99)

## 2020-11-20 SURGERY — ESOPHAGOGASTRODUODENOSCOPY (EGD) WITH PROPOFOL
Anesthesia: General

## 2020-11-20 MED ORDER — PROPOFOL 10 MG/ML IV BOLUS
INTRAVENOUS | Status: DC | PRN
  Administered 2020-11-20: 40 mg via INTRAVENOUS

## 2020-11-20 MED ORDER — SODIUM CHLORIDE 0.9 % IV SOLN
INTRAVENOUS | Status: DC
  Administered 2020-11-20: 1000 mL via INTRAVENOUS

## 2020-11-20 MED ORDER — PROPOFOL 500 MG/50ML IV EMUL
INTRAVENOUS | Status: AC
  Filled 2020-11-20: qty 50

## 2020-11-20 MED ORDER — PROPOFOL 500 MG/50ML IV EMUL
INTRAVENOUS | Status: DC | PRN
  Administered 2020-11-20: 150 ug/kg/min via INTRAVENOUS

## 2020-11-20 MED ORDER — ONDANSETRON HCL 4 MG/2ML IJ SOLN
INTRAMUSCULAR | Status: DC | PRN
  Administered 2020-11-20: 4 mg via INTRAVENOUS

## 2020-11-20 NOTE — H&P (Signed)
Outpatient short stay form Pre-procedure 11/20/2020 8:06 AM Ashlee Miyamoto MD, MPH  Primary Physician: Dr. [name]  Reason for visit:  Dysphagia  History of present illness:   64 y/o lady with history of GERD, DM II, and MS here for EGD to evaluate occasional solid food dysphagia. No blood thinners, no neck surgeries. Paternal grandparent with colon cancer.    Current Facility-Administered Medications:  .  0.9 %  sodium chloride infusion, , Intravenous, Continuous, Emryn Flanery, Hilton Cork, MD, Last Rate: 20 mL/hr at 11/20/20 0755, 1,000 mL at 11/20/20 0755  Medications Prior to Admission  Medication Sig Dispense Refill Last Dose  . acetaminophen (TYLENOL) 500 MG tablet Take 2 tablets (1,000 mg total) by mouth 3 (three) times daily. 30 tablet 0 11/19/2020 at Unknown time  . alendronate (FOSAMAX) 70 MG tablet Take 70 mg by mouth once a week. Take with a full glass of water on an empty stomach.  sunday   Past Week at Unknown time  . ascorbic acid (VITAMIN C) 500 MG tablet Take 500 mg by mouth daily.   Past Week at Unknown time  . atorvastatin (LIPITOR) 80 MG tablet Take 80 mg by mouth at bedtime.   11/19/2020 at Unknown time  . baclofen (LIORESAL) 20 MG tablet Take 40 mg by mouth at bedtime.    11/19/2020 at Unknown time  . calcium-vitamin D (OSCAL WITH D) 500-200 MG-UNIT tablet Take 1 tablet by mouth 2 (two) times daily. With meals   Past Week at Unknown time  . cyanocobalamin (,VITAMIN B-12,) 1000 MCG/ML injection Inject 1,000 mcg into the muscle every 30 (thirty) days.    Past Week at Unknown time  . diazepam (VALIUM) 2 MG tablet Take 2 mg by mouth 4 (four) times daily as needed for muscle spasms.    11/19/2020 at Unknown time  . Dimethyl Fumarate 240 MG CPDR Take 240 mg by mouth in the morning and at bedtime.   Past Week at Unknown time  . estradiol (ESTRACE) 0.1 MG/GM vaginal cream Estrogen Cream Instruction  Discard applicator  Apply pea sized amount to tip of finger to urethra before  bed. Wash hands well after application. Use Monday, Wednesday and Friday for four weeks. Then apply once a week 42.5 g 12 Past Week at Unknown time  . famotidine (PEPCID) 40 MG tablet Take 1 tablet (40 mg total) by mouth at bedtime. 30 tablet 2 11/19/2020 at Unknown time  . FLUoxetine (PROZAC) 20 MG capsule Take 60 mg by mouth daily.   11/19/2020 at Unknown time  . hydrochlorothiazide (HYDRODIURIL) 25 MG tablet Take 25 mg by mouth daily.   11/19/2020 at Unknown time  . levothyroxine (SYNTHROID, LEVOTHROID) 50 MCG tablet Take 50 mcg by mouth daily before breakfast.   11/20/2020 at 0615  . LORazepam (ATIVAN) 1 MG tablet Take 1 mg by mouth 2 (two) times daily.    11/19/2020 at Unknown time  . metFORMIN (GLUCOPHAGE-XR) 500 MG 24 hr tablet Take 1,500 mg by mouth at bedtime.   3 Past Week at Unknown time  . methylphenidate (RITALIN) 10 MG tablet Take 10 mg by mouth 2 (two) times daily.   11/19/2020 at Unknown time  . Multiple Vitamins-Minerals (MULTIVITAMIN WITH MINERALS) tablet Take 1 tablet by mouth daily.   Past Week at Unknown time  . ondansetron (ZOFRAN-ODT) 4 MG disintegrating tablet Take 4 mg by mouth every 8 (eight) hours as needed for nausea/vomiting.   11/19/2020 at Unknown time  . pantoprazole (PROTONIX) 40 MG tablet Take  40 mg by mouth daily.    11/20/2020 at 0615  . Phendimetrazine Tartrate 35 MG TABS Take 105 mg by mouth daily.   11/19/2020 at Unknown time  . predniSONE (DELTASONE) 10 MG tablet Take 10 mg by mouth daily with breakfast.   11/20/2020 at 0615  . Semaglutide,0.25 or 0.5MG/DOS, (OZEMPIC, 0.25 OR 0.5 MG/DOSE,) 2 MG/1.5ML SOPN Inject 0.25 mg into the skin every Sunday.   Past Week at Unknown time  . valsartan-hydrochlorothiazide (DIOVAN-HCT) 320-12.5 MG tablet Take 1 tablet by mouth daily.   11/20/2020 at 0615  . clindamycin (CLEOCIN) 150 MG capsule Take by mouth 3 (three) times daily. (Patient not taking: Reported on 11/20/2020)   Completed Course at Unknown time  . gabapentin  (NEURONTIN) 100 MG capsule Take by mouth.    at prn  . mirabegron ER (MYRBETRIQ) 50 MG TB24 tablet Take 50 mg by mouth daily. (Patient not taking: Reported on 11/20/2020)   Completed Course at Unknown time  . nitrofurantoin, macrocrystal-monohydrate, (MACROBID) 100 MG capsule Take 100 mg by mouth 2 (two) times daily. (Patient not taking: Reported on 11/20/2020)   Completed Course at Unknown time  . traMADol (ULTRAM) 50 MG tablet Take by mouth every 6 (six) hours as needed. (Patient not taking: Reported on 11/20/2020)   Not Taking at Unknown time     Allergies  Allergen Reactions  . Other Hives    succinylsulfathiazole  . Penicillins Hives    Did it involve swelling of the face/tongue/throat, SOB, or low BP? No Did it involve sudden or severe rash/hives, skin peeling, or any reaction on the inside of your mouth or nose? Yes Did you need to seek medical attention at a hospital or doctor's office? Unknown When did it last happen?20 + years If all above answers are "NO", may proceed with cephalosporin use.    . Sulfa Antibiotics Hives and Nausea And Vomiting  . Ibuprofen Rash     Past Medical History:  Diagnosis Date  . Anxiety   . Arthritis   . Depression   . Diabetes mellitus without complication ([place])   . GERD (gastroesophageal reflux disease)   . Hypertension   . Hypothyroidism   . Multiple sclerosis ([place]) 1980  . Osteoporosis   . Sleep apnea   . Vision abnormalities     Review of systems:  Otherwise negative.    Physical Exam  Gen: Alert, oriented. Appears stated age.  HEENT:PERRLA. Lungs: No respiratory distress CV: RRR Abd: soft, benign, no masses Ext: No edema    Planned procedures: Proceed with EGD. The patient understands the nature of the planned procedure, indications, risks, alternatives and potential complications including but not limited to bleeding, infection, perforation, damage to internal organs and possible oversedation/side effects from  anesthesia. The patient agrees and gives consent to proceed.  Please refer to procedure notes for findings, recommendations and patient disposition/instructions.     Ashlee Miyamoto MD, MPH Gastroenterology 11/20/2020  8:06 AM

## 2020-11-20 NOTE — Interval H&P Note (Signed)
History and Physical Interval Note:  11/20/2020 8:08 AM  Ashlee Fields  has presented today for surgery, with the diagnosis of GERD DYSPHAGIA.  The various methods of treatment have been discussed with the patient and family. After consideration of risks, benefits and other options for treatment, the patient has consented to  Procedure(s): ESOPHAGOGASTRODUODENOSCOPY (EGD) WITH PROPOFOL (N/A) as a surgical intervention.  The patient's history has been reviewed, patient examined, no change in status, stable for surgery.  I have reviewed the patient's chart and labs.  Questions were answered to the patient's satisfaction.     [name]  Ok to proceed with EGD

## 2020-11-20 NOTE — Transfer of Care (Signed)
Immediate Anesthesia Transfer of Care Note  Patient: Ashlee Fields  Procedure(s) Performed: ESOPHAGOGASTRODUODENOSCOPY (EGD) WITH PROPOFOL (N/A )  Patient Location: PACU  Anesthesia Type:General  Level of Consciousness: awake, alert  and oriented  Airway & Oxygen Therapy: Patient Spontanous Breathing and Patient connected to nasal cannula oxygen  Post-op Assessment: Report given to RN and Post -op Vital signs reviewed and stable  Post vital signs: Reviewed and stable  Last Vitals:  Vitals Value Taken Time  BP 137/76 11/20/20 0832  Temp 36.2 C 11/20/20 0830  Pulse 78 11/20/20 0834  Resp 19 11/20/20 0834  SpO2 96 % 11/20/20 0834  Vitals shown include unvalidated device data.  Last Pain:  Vitals:   11/20/20 0830  TempSrc: Temporal  PainSc:          Complications: No complications documented.

## 2020-11-20 NOTE — Anesthesia Procedure Notes (Signed)
Date/Time: 11/20/2020 8:15 AM Performed by: Allean Found, CRNA Pre-anesthesia Checklist: Patient identified, Emergency Drugs available, Suction available, Patient being monitored and Timeout performed Oxygen Delivery Method: Nasal cannula Placement Confirmation: positive ETCO2

## 2020-11-20 NOTE — Op Note (Signed)
[facility] Gastroenterology Patient Name: Ashlee Fields Procedure Date: 11/20/2020 7:36 AM MRN: [id number] Account #: [id number] Date of Birth: [birth year] Admit Type: Outpatient Age: 64 Room: [facility] ENDO ROOM 1 Gender: Female Note Status: Finalized Procedure:             Upper GI endoscopy Indications:           Dysphagia Providers:             Andrey Farmer MD, MD Medicines:             Monitored Anesthesia Care Complications:         No immediate complications. Estimated blood loss:                         Minimal. Procedure:             Pre-Anesthesia Assessment:                        - Prior to the procedure, a History and Physical was                         performed, and patient medications and allergies were                         reviewed. The patient is competent. The risks and                         benefits of the procedure and the sedation options and                         risks were discussed with the patient. All questions                         were answered and informed consent was obtained.                         Patient identification and proposed procedure were                         verified by the physician, the nurse, the anesthetist                         and the technician in the endoscopy suite. Mental                         Status Examination: alert and oriented. Airway                         Examination: normal oropharyngeal airway and neck                         mobility. Respiratory Examination: clear to                         auscultation. CV Examination: normal. Prophylactic                         Antibiotics: The patient does not require prophylactic  antibiotics. Prior Anticoagulants: The patient has                         taken no previous anticoagulant or antiplatelet                         agents. ASA Grade Assessment: III - A patient with                         severe systemic  disease. After reviewing the risks and                         benefits, the patient was deemed in satisfactory                         condition to undergo the procedure. The anesthesia                         plan was to use monitored anesthesia care (MAC).                         Immediately prior to administration of medications,                         the patient was re-assessed for adequacy to receive                         sedatives. The heart rate, respiratory rate, oxygen                         saturations, blood pressure, adequacy of pulmonary                         ventilation, and response to care were monitored                         throughout the procedure. The physical status of the                         patient was re-assessed after the procedure.                        After obtaining informed consent, the endoscope was                         passed under direct vision. Throughout the procedure,                         the patient's blood pressure, pulse, and oxygen                         saturations were monitored continuously. The Endoscope                         was introduced through the mouth, and advanced to the                         second part of duodenum. The upper GI endoscopy was  accomplished without difficulty. The patient tolerated                         the procedure well. Findings:      No endoscopic abnormality was evident in the esophagus to explain the       patient's complaint of dysphagia. Biopsies were obtained from the       proximal and distal esophagus with cold forceps for histology of       suspected eosinophilic esophagitis. Estimated blood loss was minimal.      The entire examined stomach was normal.      The examined duodenum was normal. Impression:            - No endoscopic esophageal abnormality to explain                         patient's dysphagia. Biopsied.                        - Normal stomach.                         - Normal examined duodenum. Recommendation:        - Discharge patient to home.                        - Resume previous diet.                        - Continue present medications.                        - Await pathology results.                        - Return to referring physician as previously                         scheduled. Procedure Code(s):     --- Professional ---                        [id number], Esophagogastroduodenoscopy, flexible,                         transoral; with biopsy, single or multiple Diagnosis Code(s):     --- Professional ---                        R13.10, Dysphagia, unspecified CPT copyright 2019 [employer]. All rights reserved. The codes documented in this report are preliminary and upon coder review may  be revised to meet current compliance requirements. Andrey Farmer, MD Andrey Farmer MD, MD 11/20/2020 8:30:45 AM Number of Addenda: 0 Note Initiated On: 11/20/2020 7:36 AM Estimated Blood Loss:  Estimated blood loss was minimal.      [facility]

## 2020-11-20 NOTE — Anesthesia Postprocedure Evaluation (Signed)
Anesthesia Post Note  Patient: Ashlee Fields  Procedure(s) Performed: ESOPHAGOGASTRODUODENOSCOPY (EGD) WITH PROPOFOL (N/A )  Patient location during evaluation: Endoscopy Anesthesia Type: General Level of consciousness: awake and alert Pain management: pain level controlled Vital Signs Assessment: post-procedure vital signs reviewed and stable Respiratory status: spontaneous breathing and respiratory function stable Cardiovascular status: stable Anesthetic complications: no   No complications documented.   Last Vitals:  Vitals:   11/20/20 0840 11/20/20 0850  BP: 133/72 134/68  Pulse: 77 76  Resp: 20 12  Temp:    SpO2: 96% 97%    Last Pain:  Vitals:   11/20/20 0830  TempSrc: Temporal  PainSc:                  [name]

## 2020-11-20 NOTE — Anesthesia Preprocedure Evaluation (Signed)
Anesthesia Evaluation  Patient identified by MRN, date of birth, ID band Patient awake    Reviewed: Allergy & Precautions, NPO status , Patient's Chart, lab work & pertinent test results  History of Anesthesia Complications Negative for: history of anesthetic complications  Airway Mallampati: III       Dental   Pulmonary sleep apnea and Continuous Positive Airway Pressure Ventilation , neg COPD, Patient abstained from smoking.Not current smoker, former smoker,           Cardiovascular hypertension, Pt. on medications (-) Past MI and (-) CHF (-) dysrhythmias (-) Valvular Problems/Murmurs     Neuro/Psych neg Seizures Anxiety Depression    GI/Hepatic Neg liver ROS, GERD  Medicated and Controlled,  Endo/Other  diabetes, Type 2, Oral Hypoglycemic AgentsHypothyroidism Morbid obesity  Renal/GU negative Renal ROS     Musculoskeletal   Abdominal   Peds  Hematology   Anesthesia Other Findings   Reproductive/Obstetrics                             Anesthesia Physical Anesthesia Plan  ASA: III  Anesthesia Plan: General   Post-op Pain Management:    Induction: Intravenous  PONV Risk Score and Plan: 3 and Propofol infusion, TIVA and Treatment may vary due to age or medical condition  Airway Management Planned: Nasal Cannula  Additional Equipment:   Intra-op Plan:   Post-operative Plan:   Informed Consent: I have reviewed the patients History and Physical, chart, labs and discussed the procedure including the risks, benefits and alternatives for the proposed anesthesia with the patient or authorized representative who has indicated his/her understanding and acceptance.       Plan Discussed with:   Anesthesia Plan Comments:         Anesthesia Quick Evaluation

## 2020-11-21 ENCOUNTER — Encounter: Payer: Self-pay | Admitting: Gastroenterology

## 2020-11-21 LAB — SURGICAL PATHOLOGY

## 2021-01-07 ENCOUNTER — Ambulatory Visit: Payer: Medicare HMO | Admitting: Urology

## 2021-02-22 ENCOUNTER — Other Ambulatory Visit: Payer: Self-pay | Admitting: Internal Medicine

## 2021-02-22 DIAGNOSIS — R1012 Left upper quadrant pain: Secondary | ICD-10-CM

## 2021-02-28 ENCOUNTER — Other Ambulatory Visit: Payer: Self-pay

## 2021-02-28 ENCOUNTER — Ambulatory Visit (HOSPITAL_COMMUNITY)
Admission: RE | Admit: 2021-02-28 | Discharge: 2021-02-28 | Disposition: A | Discharge status: Home / Self Care | Payer: Medicare HMO | Source: Ambulatory Visit | Attending: Internal Medicine | Admitting: Internal Medicine

## 2021-02-28 DIAGNOSIS — R1012 Left upper quadrant pain: Secondary | ICD-10-CM | POA: Diagnosis not present

## 2021-03-04 ENCOUNTER — Telehealth: Payer: Self-pay | Admitting: Urology

## 2021-03-04 NOTE — Telephone Encounter (Signed)
Pt wants to know if she should continue her Premarin Cream.  I couldn't get her in with MacDiarmid to talk about Botox til 5/2.  She said she is very dry down there.

## 2021-03-06 MED ORDER — ESTRADIOL 0.1 MG/GM VA CREA: TOPICAL_CREAM | VAGINAL | 12 refills | Status: DC

## 2021-03-06 NOTE — Addendum Note (Signed)
Addended by: Verlene Mayer A on: 03/06/2021 02:01 PM   Modules accepted: Orders

## 2021-03-06 NOTE — Telephone Encounter (Signed)
Left patient a VM with details, sent in refills for Estrace. Asked to return call with further questions.

## 2021-03-25 ENCOUNTER — Ambulatory Visit: Payer: Medicare HMO | Admitting: Urology

## 2021-03-25 ENCOUNTER — Encounter: Payer: Self-pay | Admitting: Urology

## 2021-03-25 ENCOUNTER — Other Ambulatory Visit: Payer: Self-pay

## 2021-03-25 VITALS — BP 131/83 | HR 86

## 2021-03-25 DIAGNOSIS — N3946 Mixed incontinence: Secondary | ICD-10-CM | POA: Diagnosis not present

## 2021-03-25 MED ORDER — CIPROFLOXACIN HCL 250 MG PO TABS: ORAL_TABLET | ORAL | 0 refills | Status: DC

## 2021-03-25 NOTE — Addendum Note (Signed)
Addended by: Verlene Mayer A on: 03/25/2021 01:28 PM   Modules accepted: Orders

## 2021-03-25 NOTE — Progress Notes (Signed)
03/25/2021 1:14 PM   [place] 25-Apr-1956 [id number]  Referring provider: [name], MD [place] [facility] [place],  [place] [address]  Chief Complaint  Patient presents with  . Urinary Incontinence    HPI: Patient history renewed.  She has high-volume incontinence with functional issues and neurogenic bladder.  Percutaneous tibial nerve stimulation was too expensive.  She had Botox July 23, 2020.  She has vaginal irritation symptoms and has been given Diflucan in the past.  I gave her estrogen cream last time and we schedule her for Botox in January.  Urine culture in November was negative.  Had shingles and therefore delayed  Still incontinent.  Just having some shots in her back on the right side for muscular pain and has an MRI next week  Clinically not infected.  Frequency stable   PMH: Past Medical History:  Diagnosis Date  . Anxiety   . Arthritis   . Depression   . Diabetes mellitus without complication ([place])   . GERD (gastroesophageal reflux disease)   . Hypertension   . Hypothyroidism   . Multiple sclerosis ([place]) 1980  . Osteoporosis   . Sleep apnea   . Vision abnormalities     Surgical History: Past Surgical History:  Procedure Laterality Date  . CESAREAN SECTION    . COLONOSCOPY    . ESOPHAGOGASTRODUODENOSCOPY (EGD) WITH PROPOFOL N/A 11/20/2020   Procedure: ESOPHAGOGASTRODUODENOSCOPY (EGD) WITH PROPOFOL;  Surgeon: [name], MD;  Location: [facility] ENDOSCOPY;  Service: Endoscopy;  Laterality: N/A;  . JOINT REPLACEMENT    . REPLACEMENT TOTAL KNEE Left 2009  . RIGHT HEART CATH Right 08/05/2017   Procedure: RIGHT HEART CATH;  Surgeon: Teodoro Spray, MD;  Location: [place] [facility];  Service: Cardiovascular;  Laterality: Right;  . TOTAL KNEE ARTHROPLASTY Right 02/28/2020   Procedure: TOTAL KNEE ARTHROPLASTY;  Surgeon: Thornton Park, MD;  Location: [facility] ORS;  Service: Orthopedics;  Laterality: Right;    Home  Medications:  Allergies as of 03/25/2021      Reactions   Other Hives   succinylsulfathiazole   Penicillins Hives   Did it involve swelling of the face/tongue/throat, SOB, or low BP? No Did it involve sudden or severe rash/hives, skin peeling, or any reaction on the inside of your mouth or nose? Yes Did you need to seek medical attention at a hospital or doctor's office? Unknown When did it last happen?20 + years If all above answers are "NO", may proceed with cephalosporin use.   Sulfa Antibiotics Hives, Nausea And Vomiting   Ibuprofen Rash      Medication List       Accurate as of Mar 25, 2021  1:14 PM. If you have any questions, ask your nurse or doctor.        STOP taking these medications   clindamycin 150 MG capsule Commonly known as: CLEOCIN Stopped by: Reece Packer, MD   predniSONE 10 MG tablet Commonly known as: DELTASONE Stopped by: Reece Packer, MD     TAKE these medications   acetaminophen 500 MG tablet Commonly known as: TYLENOL Take 2 tablets (1,000 mg total) by mouth 3 (three) times daily.   alendronate 70 MG tablet Commonly known as: FOSAMAX Take 70 mg by mouth once a week. Take with a full glass of water on an empty stomach.  sunday   ARIPiprazole 5 MG tablet Commonly known as: ABILIFY Take by mouth.   ascorbic acid 500 MG tablet Commonly  known as: VITAMIN C Take 500 mg by mouth daily.   atorvastatin 80 MG tablet Commonly known as: LIPITOR Take 80 mg by mouth at bedtime.   baclofen 20 MG tablet Commonly known as: LIORESAL Take 40 mg by mouth at bedtime.   bisoprolol 5 MG tablet Commonly known as: ZEBETA Take 5 mg by mouth daily.   calcium-vitamin D 500-200 MG-UNIT tablet Commonly known as: OSCAL WITH D Take 1 tablet by mouth 2 (two) times daily. With meals   cyanocobalamin 1000 MCG/ML injection Commonly known as: (VITAMIN B-12) Inject 1,000 mcg into the muscle every 30 (thirty) days.   diazepam 2 MG tablet Commonly  known as: VALIUM Take 2 mg by mouth 4 (four) times daily as needed for muscle spasms.   Dimethyl Fumarate 240 MG Cpdr Take 240 mg by mouth in the morning and at bedtime.   estradiol 0.1 MG/GM vaginal cream Commonly known as: ESTRACE Estrogen Cream Instruction  Discard applicator  Apply pea sized amount to tip of finger to urethra before bed. Wash hands well after application. Use Monday, Wednesday and Friday for four weeks. Then apply once a week   famotidine 40 MG tablet Commonly known as: PEPCID Take 1 tablet (40 mg total) by mouth at bedtime.   FLUoxetine 20 MG capsule Commonly known as: PROZAC Take 60 mg by mouth daily.   gabapentin 300 MG capsule Commonly known as: NEURONTIN Take 1 capsule by mouth 2 (two) times daily. What changed: Another medication with the same name was removed. Continue taking this medication, and follow the directions you see here. Changed by: Reece Packer, MD   hydrochlorothiazide 25 MG tablet Commonly known as: HYDRODIURIL Take 25 mg by mouth daily.   levothyroxine 50 MCG tablet Commonly known as: SYNTHROID Take 50 mcg by mouth daily before breakfast.   LORazepam 1 MG tablet Commonly known as: ATIVAN Take 1 mg by mouth 2 (two) times daily.   metFORMIN 500 MG 24 hr tablet Commonly known as: GLUCOPHAGE-XR Take 1,500 mg by mouth at bedtime.   methylphenidate 10 MG tablet Commonly known as: RITALIN Take 10 mg by mouth 2 (two) times daily.   metoCLOPramide 5 MG tablet Commonly known as: REGLAN metoclopramide 5 mg tablet  TAKE 1 TABLET (5 MG TOTAL) BY MOUTH 3 (THREE) TIMES DAILY BEFORE MEALS FOR 10 DAYS   mirabegron ER 50 MG Tb24 tablet Commonly known as: MYRBETRIQ Take 50 mg by mouth daily.   multivitamin with minerals tablet Take 1 tablet by mouth daily.   nitrofurantoin (macrocrystal-monohydrate) 100 MG capsule Commonly known as: MACROBID Take 100 mg by mouth 2 (two) times daily.   ondansetron 4 MG disintegrating  tablet Commonly known as: ZOFRAN-ODT Take 4 mg by mouth every 8 (eight) hours as needed for nausea/vomiting.   oxybutynin 10 MG 24 hr tablet Commonly known as: DITROPAN-XL oxybutynin chloride ER 10 mg tablet,extended release 24 hr  TAKE 1 TABLET BY MOUTH EVERY DAY.   Ozempic (0.25 or 0.5 MG/DOSE) 2 MG/1.5ML Sopn Generic drug: Semaglutide(0.25 or 0.5MG/DOS) Inject into the skin. What changed: Another medication with the same name was removed. Continue taking this medication, and follow the directions you see here. Changed by: Reece Packer, MD   pantoprazole 40 MG tablet Commonly known as: PROTONIX Take 40 mg by mouth daily.   Phendimetrazine Tartrate 35 MG Tabs Take 105 mg by mouth daily.   traMADol 50 MG tablet Commonly known as: ULTRAM Take by mouth every 6 (six) hours as needed.  valsartan-hydrochlorothiazide 320-12.5 MG tablet Commonly known as: DIOVAN-HCT Take 1 tablet by mouth daily.       Allergies:  Allergies  Allergen Reactions  . Other Hives    succinylsulfathiazole  . Penicillins Hives    Did it involve swelling of the face/tongue/throat, SOB, or low BP? No Did it involve sudden or severe rash/hives, skin peeling, or any reaction on the inside of your mouth or nose? Yes Did you need to seek medical attention at a hospital or doctor's office? Unknown When did it last happen?20 + years If all above answers are "NO", may proceed with cephalosporin use.    . Sulfa Antibiotics Hives and Nausea And Vomiting  . Ibuprofen Rash    Family History: Family History  Problem Relation Age of Onset  . Breast cancer Paternal Grandmother   . Heart disease Mother   . Kidney failure Father   . Colon cancer Maternal Grandmother     Social History:  reports that she quit smoking about 4 years ago. Her smoking use included cigarettes. She smoked 0.00 packs per day. She has never used smokeless tobacco. She reports that she does not drink alcohol and does not  use drugs.  ROS:                                        Physical Exam: There were no vitals taken for this visit.    Laboratory Data: Lab Results  Component Value Date   WBC 11.6 (H) 03/01/2020   HGB 10.0 (L) 03/01/2020   HCT 31.3 (L) 03/01/2020   MCV 93.2 03/01/2020   PLT 333 03/01/2020    Lab Results  Component Value Date   CREATININE 0.63 02/29/2020    No results found for: PSA  No results found for: TESTOSTERONE  No results found for: HGBA1C  Urinalysis    Component Value Date/Time   COLORURINE RED (A) 02/29/2020 0130   APPEARANCEUR Cloudy (A) 10/08/2020 1126   LABSPEC 1.025 02/29/2020 0130   PHURINE 6.0 02/29/2020 0130   GLUCOSEU Negative 10/08/2020 1126   HGBUR LARGE (A) 02/29/2020 0130   BILIRUBINUR Negative 10/08/2020 1126   KETONESUR NEGATIVE 02/29/2020 0130   PROTEINUR Negative 10/08/2020 1126   PROTEINUR 100 (A) 02/29/2020 0130   NITRITE Negative 10/08/2020 1126   NITRITE NEGATIVE 02/29/2020 0130   LEUKOCYTESUR 2+ (A) 10/08/2020 1126   LEUKOCYTESUR NEGATIVE 02/29/2020 0130    Pertinent Imaging:   Assessment & Plan: Usual protocol.  Urine sent for culture.  3-day prescription of Cipro given.  Scheduled for Botox  There are no diagnoses linked to this encounter.  No follow-ups on file.  Reece Packer, MD  [place] [address], [place], [place] [address] [phone]

## 2021-03-27 ENCOUNTER — Ambulatory Visit: Payer: Medicare HMO

## 2021-03-27 ENCOUNTER — Other Ambulatory Visit: Payer: Self-pay

## 2021-03-27 DIAGNOSIS — R32 Unspecified urinary incontinence: Secondary | ICD-10-CM

## 2021-03-27 LAB — MICROSCOPIC EXAMINATION: WBC, UA: >30 /hpf — AB (ref 0–5)

## 2021-03-27 LAB — URINALYSIS, COMPLETE
Bilirubin, UA: NEGATIVE
Glucose, UA: NEGATIVE
Nitrite, UA: NEGATIVE
RBC, UA: NEGATIVE
Specific Gravity, UA: 1.025 (ref 1.005–1.030)
Urobilinogen, Ur: 0.2 mg/dL (ref 0.2–1.0)
pH, UA: 5.5 (ref 5.0–7.5)

## 2021-04-01 ENCOUNTER — Ambulatory Visit: Payer: Self-pay | Admitting: Urology

## 2021-04-03 ENCOUNTER — Telehealth: Payer: Self-pay | Admitting: *Deleted

## 2021-04-03 LAB — CULTURE, URINE COMPREHENSIVE

## 2021-04-03 MED ORDER — CIPROFLOXACIN HCL 250 MG PO TABS: 250.0000 mg | ORAL_TABLET | Freq: Two times a day (BID) | ORAL | 0 refills | Status: DC

## 2021-04-03 NOTE — Telephone Encounter (Addendum)
   Patient informed, voiced understanding. Sent RX to [facility]  ----- Message from Bjorn Loser, MD sent at 04/03/2021  1:08 PM EDT ----- Ciprofloxacin 250 mg twice a day for 1 week    ----- Message ----- From: Shanon Ace, CMA Sent: 04/02/2021   4:44 PM EDT To: Bjorn Loser, MD  She is scheduled for Botox on 5/16-this is the pre-lim report Do you want to start her on anything? Thanks Adria Devon  ----- Message ----- From: Lavone Neri Lab Results In Sent: 03/27/2021   4:37 PM EDT To: Rowe Robert Clinical

## 2021-04-08 ENCOUNTER — Ambulatory Visit: Payer: Medicare HMO | Admitting: Urology

## 2021-04-08 ENCOUNTER — Other Ambulatory Visit: Payer: Self-pay

## 2021-04-08 ENCOUNTER — Encounter: Payer: Self-pay | Admitting: Urology

## 2021-04-08 VITALS — BP 136/78 | HR 98

## 2021-04-08 DIAGNOSIS — N3941 Urge incontinence: Secondary | ICD-10-CM

## 2021-04-08 DIAGNOSIS — N3946 Mixed incontinence: Secondary | ICD-10-CM

## 2021-04-08 MED ORDER — ONABOTULINUMTOXINA 100 UNITS IJ SOLR
100.0000 [IU] | Freq: Once | INTRAMUSCULAR | Status: AC
  Administered 2021-04-08: 100 [IU] via INTRAMUSCULAR

## 2021-04-08 MED ORDER — LIDOCAINE HCL 2 % IJ SOLN
60.0000 mL | Freq: Once | INTRAMUSCULAR | Status: AC
Start: 2021-04-08 — End: 2021-04-08
  Administered 2021-04-08: 1200 mg

## 2021-04-08 NOTE — Progress Notes (Signed)
Bladder Instillation  Due to Botox patient is present today for a Bladder Instillation of 2% Lidocaine. Patient was cleaned and prepped in a sterile fashion with betadine and lidocaine 2% jelly was instilled into the urethra.  A 14FR catheter was inserted, urine return was noted 140m, urine was yellow in color.  60 ml was instilled into the bladder. The catheter was then removed. Patient tolerated well, no complications were noted Patient held in bladder for 30 minutes prior to procedure starting.   Performed by: RVerlene Mayer [place]

## 2021-04-08 NOTE — Patient Instructions (Signed)
Step 1 Get all of your supplies ready and place near you. Step 2 Wash your hands or put on gloves. Step 3 Wash around urethral opening with warm antibacterial/ hypoallergenic soapy water from front to back. Step 4 take the catheter out of the package and drain the lubricant over toilet. Step 5 Sit on the toilet and spread your legs to begin catheterization. Step 6 Use your fingers to spread the labia and feel for the urethra.             A MIRROR MAY BE HELPFUL AT FIRST Step 7 Insert the catheter slowly into the urethra. If there is resistance when the catheter reaches the the sphincter muscle,              take a deep breath and gently apply steady pressure.            DO NOT FORCE THE CATHETER Step 8 When the urine begins to flow insert another inch, allow the urine to flow into the toilet. Step 9 When the flow of urine stops, slowly remove the catheter.

## 2021-04-08 NOTE — Progress Notes (Signed)
04/08/2021 1:22 PM   [place] June 05, 1956 [id number]  Referring provider: [name], MD [place] [facility] [place],  [place] [address]  Chief Complaint  Patient presents with  . Botulinum Toxin Injection    HPI: Reviewed chart.  Patient soaks a number of pads a day with urge incontinence.  90% better on last Botox treatment.  She is functional issues and high-volume incontinence.  Could not afford percutaneous tibial nerve stimulation.  Patient had a positive culture last week and is on the antibiotic appropriately.  Incontinence persisting.  Clinically not infected  Cystoscopy: Patient underwent flexible cystoscopy utilizing sterile technique.  Bladder mucosa and trigone were normal.  No cystitis.  No carcinoma  Using the injection needle I injected 10 cc of normal saline with 100 units of Botox with more of the midline diffuse template.  No bleeding.  She tolerated very well.     PMH: Past Medical History:  Diagnosis Date  . Anxiety   . Arthritis   . Depression   . Diabetes mellitus without complication ([place])   . GERD (gastroesophageal reflux disease)   . Hypertension   . Hypothyroidism   . Multiple sclerosis ([place]) 1980  . Osteoporosis   . Sleep apnea   . Vision abnormalities     Surgical History: Past Surgical History:  Procedure Laterality Date  . CESAREAN SECTION    . COLONOSCOPY    . ESOPHAGOGASTRODUODENOSCOPY (EGD) WITH PROPOFOL N/A 11/20/2020   Procedure: ESOPHAGOGASTRODUODENOSCOPY (EGD) WITH PROPOFOL;  Surgeon: [name], MD;  Location: [facility] ENDOSCOPY;  Service: Endoscopy;  Laterality: N/A;  . JOINT REPLACEMENT    . REPLACEMENT TOTAL KNEE Left 2009  . RIGHT HEART CATH Right 08/05/2017   Procedure: RIGHT HEART CATH;  Surgeon: Teodoro Spray, MD;  Location: [place] [facility] LAB;  Service: Cardiovascular;  Laterality: Right;  . TOTAL KNEE ARTHROPLASTY Right 02/28/2020   Procedure: TOTAL KNEE ARTHROPLASTY;  Surgeon:  Thornton Park, MD;  Location: [facility] ORS;  Service: Orthopedics;  Laterality: Right;    Home Medications:  Allergies as of 04/08/2021      Reactions   Other Hives   succinylsulfathiazole   Penicillins Hives   Did it involve swelling of the face/tongue/throat, SOB, or low BP? No Did it involve sudden or severe rash/hives, skin peeling, or any reaction on the inside of your mouth or nose? Yes Did you need to seek medical attention at a hospital or doctor's office? Unknown When did it last happen?20 + years If all above answers are "NO", may proceed with cephalosporin use.   Sulfa Antibiotics Hives, Nausea And Vomiting   Ibuprofen Rash      Medication List       Accurate as of Apr 08, 2021  1:22 PM. If you have any questions, ask your nurse or doctor.        acetaminophen 500 MG tablet Commonly known as: TYLENOL Take 2 tablets (1,000 mg total) by mouth 3 (three) times daily.   alendronate 70 MG tablet Commonly known as: FOSAMAX Take 70 mg by mouth once a week. Take with a full glass of water on an empty stomach.  sunday   ARIPiprazole 5 MG tablet Commonly known as: ABILIFY Take by mouth.   ascorbic acid 500 MG tablet Commonly known as: VITAMIN C Take 500 mg by mouth daily.   atorvastatin 80 MG tablet Commonly known as: LIPITOR Take 80 mg by mouth at bedtime.   baclofen 20 MG tablet  Commonly known as: LIORESAL Take 40 mg by mouth at bedtime.   bisoprolol 5 MG tablet Commonly known as: ZEBETA Take 5 mg by mouth daily.   calcium-vitamin D 500-200 MG-UNIT tablet Commonly known as: OSCAL WITH D Take 1 tablet by mouth 2 (two) times daily. With meals   ciprofloxacin 250 MG tablet Commonly known as: Cipro Take 2 Tablets day before procedure, day of procedure and day after   ciprofloxacin 250 MG tablet Commonly known as: Cipro Take 1 tablet (250 mg total) by mouth 2 (two) times daily.   cyanocobalamin 1000 MCG/ML injection Commonly known as: (VITAMIN  B-12) Inject 1,000 mcg into the muscle every 30 (thirty) days.   diazepam 2 MG tablet Commonly known as: VALIUM Take 2 mg by mouth 4 (four) times daily as needed for muscle spasms.   estradiol 0.1 MG/GM vaginal cream Commonly known as: ESTRACE Estrogen Cream Instruction  Discard applicator  Apply pea sized amount to tip of finger to urethra before bed. Wash hands well after application. Use Monday, Wednesday and Friday for four weeks. Then apply once a week   famotidine 40 MG tablet Commonly known as: PEPCID Take 1 tablet (40 mg total) by mouth at bedtime.   FLUoxetine 20 MG capsule Commonly known as: PROZAC Take 60 mg by mouth daily.   gabapentin 300 MG capsule Commonly known as: NEURONTIN Take 1 capsule by mouth 2 (two) times daily.   hydrochlorothiazide 25 MG tablet Commonly known as: HYDRODIURIL Take 25 mg by mouth daily.   levothyroxine 50 MCG tablet Commonly known as: SYNTHROID Take 50 mcg by mouth daily before breakfast.   LORazepam 1 MG tablet Commonly known as: ATIVAN Take 1 mg by mouth 2 (two) times daily.   metFORMIN 500 MG 24 hr tablet Commonly known as: GLUCOPHAGE-XR Take 1,500 mg by mouth at bedtime.   methylphenidate 10 MG tablet Commonly known as: RITALIN Take 10 mg by mouth 2 (two) times daily.   metoCLOPramide 5 MG tablet Commonly known as: REGLAN metoclopramide 5 mg tablet  TAKE 1 TABLET (5 MG TOTAL) BY MOUTH 3 (THREE) TIMES DAILY BEFORE MEALS FOR 10 DAYS   multivitamin with minerals tablet Take 1 tablet by mouth daily.   ondansetron 4 MG disintegrating tablet Commonly known as: ZOFRAN-ODT Take 4 mg by mouth every 8 (eight) hours as needed for nausea/vomiting.   oxybutynin 10 MG 24 hr tablet Commonly known as: DITROPAN-XL oxybutynin chloride ER 10 mg tablet,extended release 24 hr  TAKE 1 TABLET BY MOUTH EVERY DAY.   Ozempic (0.25 or 0.5 MG/DOSE) 2 MG/1.5ML Sopn Generic drug: Semaglutide(0.25 or 0.5MG/DOS) Inject into the skin.    pantoprazole 40 MG tablet Commonly known as: PROTONIX Take 40 mg by mouth daily.   Phendimetrazine Tartrate 35 MG Tabs Take 105 mg by mouth daily.   traMADol 50 MG tablet Commonly known as: ULTRAM Take by mouth every 6 (six) hours as needed.   valsartan-hydrochlorothiazide 320-12.5 MG tablet Commonly known as: DIOVAN-HCT Take 1 tablet by mouth daily.       Allergies:  Allergies  Allergen Reactions  . Other Hives    succinylsulfathiazole  . Penicillins Hives    Did it involve swelling of the face/tongue/throat, SOB, or low BP? No Did it involve sudden or severe rash/hives, skin peeling, or any reaction on the inside of your mouth or nose? Yes Did you need to seek medical attention at a hospital or doctor's office? Unknown When did it last happen?20 + years If all above  answers are "NO", may proceed with cephalosporin use.    . Sulfa Antibiotics Hives and Nausea And Vomiting  . Ibuprofen Rash    Family History: Family History  Problem Relation Age of Onset  . Breast cancer Paternal Grandmother   . Heart disease Mother   . Kidney failure Father   . Colon cancer Maternal Grandmother     Social History:  reports that she quit smoking about 4 years ago. Her smoking use included cigarettes. She smoked 0.00 packs per day. She has never used smokeless tobacco. She reports that she does not drink alcohol and does not use drugs.  ROS:                                        Physical Exam: There were no vitals taken for this visit.  Constitutional:  Alert and oriented, No acute distress.  Laboratory Data: Lab Results  Component Value Date   WBC 11.6 (H) 03/01/2020   HGB 10.0 (L) 03/01/2020   HCT 31.3 (L) 03/01/2020   MCV 93.2 03/01/2020   PLT 333 03/01/2020    Lab Results  Component Value Date   CREATININE 0.63 02/29/2020    No results found for: PSA  No results found for: TESTOSTERONE  No results found for:  HGBA1C  Urinalysis    Component Value Date/Time   COLORURINE RED (A) 02/29/2020 0130   APPEARANCEUR Cloudy (A) 03/27/2021 1127   LABSPEC 1.025 02/29/2020 0130   PHURINE 6.0 02/29/2020 0130   GLUCOSEU Negative 03/27/2021 1127   HGBUR LARGE (A) 02/29/2020 0130   BILIRUBINUR Negative 03/27/2021 1127   KETONESUR NEGATIVE 02/29/2020 0130   PROTEINUR 1+ (A) 03/27/2021 1127   PROTEINUR 100 (A) 02/29/2020 0130   NITRITE Negative 03/27/2021 1127   NITRITE NEGATIVE 02/29/2020 0130   LEUKOCYTESUR 1+ (A) 03/27/2021 1127   LEUKOCYTESUR NEGATIVE 02/29/2020 0130    Pertinent Imaging:   Assessment & Plan: Continue with antibiotics follow as per protocol  1. Urgency incontinence  - Urinalysis, Complete   No follow-ups on file.  Reece Packer, MD  [place] [address], [place], [place] [address] [phone]

## 2021-04-09 ENCOUNTER — Telehealth: Payer: Self-pay | Admitting: *Deleted

## 2021-04-09 NOTE — Telephone Encounter (Signed)
Patient called with concerns post botox-has noticed some hematuria. Denies any other symptoms. Patient aware hematuria should improve if her symptoms do not to seek medical attention. Voiced understanding.

## 2021-04-11 ENCOUNTER — Other Ambulatory Visit: Payer: Self-pay | Admitting: Internal Medicine

## 2021-04-11 DIAGNOSIS — M7989 Other specified soft tissue disorders: Secondary | ICD-10-CM

## 2021-04-23 ENCOUNTER — Ambulatory Visit: Payer: Self-pay | Admitting: Physician Assistant

## 2021-04-24 ENCOUNTER — Ambulatory Visit: Payer: Self-pay | Admitting: Physician Assistant

## 2021-05-02 ENCOUNTER — Other Ambulatory Visit: Payer: Self-pay

## 2021-05-02 ENCOUNTER — Ambulatory Visit (INDEPENDENT_AMBULATORY_CARE_PROVIDER_SITE_OTHER): Payer: Medicare HMO | Admitting: Physician Assistant

## 2021-05-02 VITALS — BP 116/80 | HR 86 | Ht 63.0 in | Wt 190.0 lb

## 2021-05-02 DIAGNOSIS — N3941 Urge incontinence: Secondary | ICD-10-CM

## 2021-05-02 DIAGNOSIS — N9489 Other specified conditions associated with female genital organs and menstrual cycle: Secondary | ICD-10-CM

## 2021-05-02 LAB — URINALYSIS, COMPLETE
Bilirubin, UA: NEGATIVE
Glucose, UA: NEGATIVE
Ketones, UA: NEGATIVE
Nitrite, UA: NEGATIVE
Specific Gravity, UA: 1.015 (ref 1.005–1.030)
Urobilinogen, Ur: 0.2 mg/dL (ref 0.2–1.0)
pH, UA: 6 (ref 5.0–7.5)

## 2021-05-02 LAB — BLADDER SCAN AMB NON-IMAGING

## 2021-05-02 LAB — MICROSCOPIC EXAMINATION: WBC, UA: >30 /hpf — ABNORMAL HIGH (ref 0–5)

## 2021-05-02 NOTE — Progress Notes (Signed)
05/02/2021 1:38 PM   Ashlee Fields [birth year] [id number]  CC: Chief Complaint  Patient presents with   Follow-up    HPI: Ashlee Fields is a 65 y.o. female with PMH OAB wet managed with intravesical Botox, mostly recently treated by Dr. Matilde Sprang 24 days ago who presents today for follow-up UA and PVR.   Today she reports complete resolution of her urge incontinence.  She is having some intermittent dysuria and slight foreign body sensations of the urethra, which are not uncommon for her at baseline.  She reports some previous vulvar burning which resolved on topical vaginal estrogen.  She stopped topical vaginal estrogen, but still has some at home.  On chart review, she has grown E faecalis and Pseudomonas aeruginosa on several occasions  In-office UA today positive for trace intact blood, trace protein, and 2+ leukocyte esterase; urine microscopy with >30 WBCs/HPF, 3-10 RBCs/HPF, and many bacteria. PVR 11m.  PMH: Past Medical History:  Diagnosis Date   Anxiety    Arthritis    Depression    Diabetes mellitus [place] complication [place]    GERD (gastroesophageal reflux disease)    Hypertension    Hypothyroidism    Multiple sclerosis [place]   Osteoporosis    Sleep apnea    Vision abnormalities     Surgical History: Past Surgical History:  Procedure Laterality Date   CESAREAN SECTION     COLONOSCOPY     ESOPHAGOGASTRODUODENOSCOPY (EGD) WITH PROPOFOL N/A 11/20/2020   Procedure: ESOPHAGOGASTRODUODENOSCOPY (EGD) WITH PROPOFOL;  Surgeon: LLesly Rubenstein MD;  Location: [facility] ENDOSCOPY;  Service: Endoscopy;  Laterality: N/A;   JOINT REPLACEMENT     REPLACEMENT TOTAL KNEE Left 2009   RIGHT HEART CATH Right 08/05/2017   Procedure: RIGHT HEART CATH;  Surgeon: FTeodoro Spray MD;  [facility];  Service: Cardiovascular;  Laterality: Right;   TOTAL KNEE ARTHROPLASTY Right 02/28/2020   Procedure: TOTAL KNEE ARTHROPLASTY;  Surgeon: KThornton Park  MD;  Location: [facility] ORS;  Service: Orthopedics;  Laterality: Right;    Home Medications:  Allergies as of 05/02/2021       Reactions   Other Hives   succinylsulfathiazole   Penicillins Hives   Did it involve swelling of the face/tongue/throat, SOB, or low BP? No Did it involve sudden or severe rash/hives, skin peeling, or any reaction on the inside of your mouth or nose? Yes Did you need to seek medical attention at a hospital or doctor's office? Unknown When did it last happen?      20 + years If all above answers are "NO", may proceed with cephalosporin use.   Sulfa Antibiotics Hives, Nausea And Vomiting   Ibuprofen Rash        Medication List        Accurate as of May 02, 2021  1:38 PM. If you have any questions, ask your nurse or doctor.          acetaminophen 500 MG tablet Commonly known as: TYLENOL Take 2 tablets (1,000 mg total) by mouth 3 (three) times daily.   alendronate 70 MG tablet Commonly known as: FOSAMAX Take 70 mg by mouth once a week. Take with a full glass of water on an empty stomach.  sunday   ascorbic acid 500 MG tablet Commonly known as: VITAMIN C Take 500 mg by mouth daily.   atorvastatin 80 MG tablet Commonly known as: LIPITOR Take 80 mg by mouth at bedtime.   baclofen 20 MG tablet Commonly  known as: LIORESAL Take 40 mg by mouth at bedtime.   calcium-vitamin D 500-200 MG-UNIT tablet Commonly known as: OSCAL WITH D Take 1 tablet by mouth 2 (two) times daily. With meals   ciprofloxacin 250 MG tablet Commonly known as: Cipro Take 2 Tablets day before procedure, day of procedure and day after   ciprofloxacin 250 MG tablet Commonly known as: Cipro Take 1 tablet (250 mg total) by mouth 2 (two) times daily.   cyanocobalamin 1000 MCG/ML injection Commonly known as: (VITAMIN B-12) Inject 1,000 mcg into the muscle every 30 (thirty) days.   diazepam 2 MG tablet Commonly known as: VALIUM Take 2 mg by mouth 4 (four) times daily as needed  for muscle spasms.   FLUoxetine 20 MG capsule Commonly known as: PROZAC Take 60 mg by mouth daily.   gabapentin 300 MG capsule Commonly known as: NEURONTIN Take 1 capsule by mouth 2 (two) times daily.   hydrochlorothiazide 25 MG tablet Commonly known as: HYDRODIURIL Take 25 mg by mouth daily.   levothyroxine 50 MCG tablet Commonly known as: SYNTHROID Take 50 mcg by mouth daily before breakfast.   LORazepam 1 MG tablet Commonly known as: ATIVAN Take 1 mg by mouth 2 (two) times daily.   metFORMIN 500 MG 24 hr tablet Commonly known as: GLUCOPHAGE-XR Take 1,500 mg by mouth at bedtime.   methylphenidate 10 MG tablet Commonly known as: RITALIN Take 10 mg by mouth 2 (two) times daily.   multivitamin with minerals tablet Take 1 tablet by mouth daily.   ondansetron 4 MG disintegrating tablet Commonly known as: ZOFRAN-ODT Take 4 mg by mouth every 8 (eight) hours as needed for nausea/vomiting.   Ozempic (0.25 or 0.5 MG/DOSE) 2 MG/1.5ML Sopn Generic drug: Semaglutide(0.25 or 0.5MG/DOS) Inject into the skin.   pantoprazole 40 MG tablet Commonly known as: PROTONIX Take 40 mg by mouth daily.   Phendimetrazine Tartrate 35 MG Tabs Take 105 mg by mouth daily.   traMADol 50 MG tablet Commonly known as: ULTRAM Take by mouth every 6 (six) hours as needed.   valsartan-hydrochlorothiazide 320-12.5 MG tablet Commonly known as: DIOVAN-HCT Take 1 tablet by mouth daily.        Allergies:  Allergies  Allergen Reactions   Other Hives    succinylsulfathiazole   Penicillins Hives    Did it involve swelling of the face/tongue/throat, SOB, or low BP? No Did it involve sudden or severe rash/hives, skin peeling, or any reaction on the inside of your mouth or nose? Yes Did you need to seek medical attention at a hospital or doctor's office? Unknown When did it last happen?      20 + years If all above answers are "NO", may proceed with cephalosporin use.     Sulfa Antibiotics  Hives and Nausea And Vomiting   Ibuprofen Rash    Family History: Family History  Problem Relation Age of Onset   Breast cancer Paternal Grandmother    Heart disease Mother    Kidney failure Father    Colon cancer Maternal Grandmother     Social History:   reports that she quit smoking about 4 years ago. Her smoking use included cigarettes. She has never used smokeless tobacco. She reports that she does not drink alcohol and does not use drugs.  Physical Exam: BP 116/80   Pulse 86   Ht _0  (1.6 m)   Wt 190 lb (86.2 kg)   BMI 33.66 kg/m   Constitutional:  Alert and oriented, no acute  distress, nontoxic appearing HEENT: [place], AT Cardiovascular: No clubbing, cyanosis, or edema Respiratory: Normal respiratory effort, no increased work of breathing Skin: No rashes, bruises or suspicious lesions Neurologic: Grossly intact, no focal deficits, moving all 4 extremities Psychiatric: Normal mood and affect  Laboratory Data: Results for orders placed or performed in visit on 05/02/21  Microscopic Examination   Urine  Result Value Ref Range   WBC, UA >30 (H) 0 - 5 /hpf   RBC 3-10 (A) 0 - 2 /hpf   Epithelial Cells (non renal) 0-10 0 - 10 /hpf   Bacteria, UA Many (A) None seen/Few  Urinalysis, Complete  Result Value Ref Range   Specific Gravity, UA 1.015 1.005 - 1.030   pH, UA 6.0 5.0 - 7.5   Color, UA Yellow Yellow   Appearance Ur Cloudy (A) Clear   Leukocytes,UA 2+ (A) Negative   Protein,UA Trace (A) Negative/Trace   Glucose, UA Negative Negative   Ketones, UA Negative Negative   RBC, UA Trace (A) Negative   Bilirubin, UA Negative Negative   Urobilinogen, Ur 0.2 0.2 - 1.0 mg/dL   Nitrite, UA Negative Negative   Microscopic Examination See below:   Bladder Scan (Post Void Residual) in office  Result Value Ref Range   Scan Result 70m    Assessment & Plan:   1. Urgency incontinence Resolved following intravesical Botox and patient is emptying appropriately.  Her UA does  appear grossly infected today, however she is not terribly symptomatic.  With multiple prior cultures growing the same E faecalis and pseudomonas species, suspect she may be colonized.  Will send for culture today and call her with her results.  If she remains asymptomatic, will defer therapy.  Patient expressed understanding and is in agreement with this plan.  We discussed return precautions today including fever, chills, nausea, vomiting, and flank pain. - Urinalysis, Complete - Bladder Scan (Post Void Residual) in office - CULTURE, URINE COMPREHENSIVE  2. Vulvar burning Improved to resolved on topical vaginal estrogen, however she has discontinued this.  I suspect she may have an element of atrophic vaginitis and counseled her to resume this.  She expressed understanding.  Return in about 5 months (around 10/02/2021) for Post-Botox sx recheck and scheduling.  SDebroah Loop PA-C  [facility] [address] [place] [phone]

## 2021-05-02 NOTE — Progress Notes (Deleted)
urin

## 2021-05-05 LAB — CULTURE, URINE COMPREHENSIVE

## 2021-05-06 ENCOUNTER — Telehealth: Payer: Self-pay

## 2021-05-06 NOTE — Telephone Encounter (Signed)
Patient reports she is not having any UTI symptoms at this time. I explained to patient she is likely colonized and without symptoms, treatment would be deferred. Patient verbalized understanding.

## 2021-05-06 NOTE — Telephone Encounter (Signed)
-----  Message from Debroah Loop, [place] sent at 05/06/2021 10:11 AM EDT ----- Please contact her and find out if she's having any UTI symptoms. If not, no indication to treat as she's likely colonized. If she is, then ok to send in Macrobid 17m BID x5 days. ----- Message ----- From: ILavone NeriLab Results In Sent: 05/02/2021   4:36 PM EDT To: SDebroah Loop PA-C

## 2021-05-13 ENCOUNTER — Telehealth: Payer: Self-pay

## 2021-05-13 NOTE — Telephone Encounter (Signed)
Incoming call from pt who states that since completing 2 rounds of antibiotics in May she has had diarrhea and at this point is concerned she may have C--Diff. Pt has tried Pepto bismol with no relief, and has tried Imodium AD with some relief however does not feel comfortable taking long term. She states she has spoken to her PCP about this and he has prescribed a medication however she feels it is not helping. Advised pt that are urology and therefore unable to test and or treat c-diff should that be her diagnosis. Advised pt follow up w/ PCP or urgent care asap for further assessment and possible GI referral. Pt voiced understanding.

## 2021-05-15 ENCOUNTER — Ambulatory Visit
Admission: RE | Admit: 2021-05-15 | Discharge: 2021-05-15 | Disposition: A | Discharge status: Home / Self Care | Payer: Medicare HMO | Source: Ambulatory Visit | Attending: Internal Medicine | Admitting: Internal Medicine

## 2021-05-15 ENCOUNTER — Other Ambulatory Visit: Payer: Self-pay

## 2021-05-15 DIAGNOSIS — M7989 Other specified soft tissue disorders: Secondary | ICD-10-CM | POA: Diagnosis not present

## 2021-09-11 ENCOUNTER — Other Ambulatory Visit: Payer: Self-pay | Admitting: Internal Medicine

## 2021-09-11 DIAGNOSIS — Z1231 Encounter for screening mammogram for malignant neoplasm of breast: Secondary | ICD-10-CM

## 2021-09-13 ENCOUNTER — Other Ambulatory Visit: Payer: Self-pay | Admitting: Internal Medicine

## 2021-09-13 DIAGNOSIS — R634 Abnormal weight loss: Secondary | ICD-10-CM

## 2021-09-30 ENCOUNTER — Ambulatory Visit (INDEPENDENT_AMBULATORY_CARE_PROVIDER_SITE_OTHER): Payer: Medicare HMO | Admitting: Urology

## 2021-09-30 ENCOUNTER — Other Ambulatory Visit: Payer: Self-pay

## 2021-09-30 ENCOUNTER — Telehealth: Payer: Self-pay

## 2021-09-30 DIAGNOSIS — N3946 Mixed incontinence: Secondary | ICD-10-CM | POA: Diagnosis not present

## 2021-09-30 NOTE — Progress Notes (Signed)
This service is provided via telemedicine   No vital signs collected/recorded due to the encounter was a telemedicine visit.     Patient consents to a telephone visit:  Yes    Names of all persons participating in the telemedicine service and their role in the encounter:  Jahnessa Vanduyn O`Sullivan, RMA   

## 2021-09-30 NOTE — Telephone Encounter (Signed)
Dr. Matilde Sprang would like to set this pt up for BOTOX.

## 2021-09-30 NOTE — Progress Notes (Signed)
Virtual Visit via Telephone Note  I connected with Ashlee Fields on 09/30/21 at 10:30 AM EST by telephone and verified that I am speaking with the correct person using two identifiers.   I discussed the limitations, risks, security and privacy concerns of performing an evaluation and management service by telephone and the availability of in person appointments. We discussed the impact of the COVID-19 pandemic on the healthcare system, and the importance of social distancing and reducing patient and provider exposure. I also discussed with the patient that there may be a patient responsible charge related to this service. The patient expressed understanding and agreed to proceed.  Reason for visit: Patient is stable refractory overactive bladder.  She has good days and bad days.  Sometimes she floods and sometimes she has good days.  Clinically not infected.  Frequency stable   History of Present Illness:   Assessment and Plan: Patient does have functional issues.  First 2 Botox worked very well but especially the first 1.  Second 1 took a few more weeks.   Follow Up: Schedule Botox as per protocol    I discussed the assessment and treatment plan with the patient. The patient was provided an opportunity to ask questions and all were answered. The patient agreed with the plan and demonstrated an understanding of the instructions.   The patient was advised to call back or seek an in-person evaluation if the symptoms worsen or if the condition fails to improve as anticipated.  I provided 10 minutes of non-face-to-face time during this encounter.   Reece Packer, MD

## 2021-10-08 ENCOUNTER — Telehealth: Payer: Self-pay | Admitting: Urology

## 2021-10-08 NOTE — Telephone Encounter (Signed)
Pt called in stating Dr. Matilde Sprang told her on her last visit someone would be calling her to set up an appt for botox and she has not received a call yet. She would like a call back today to get scheduled, as she had 2 "gushes" this past weekend.

## 2021-10-09 ENCOUNTER — Other Ambulatory Visit: Payer: Self-pay | Admitting: *Deleted

## 2021-10-09 MED ORDER — CIPROFLOXACIN HCL 500 MG PO TABS: ORAL_TABLET | ORAL | 0 refills | Status: DC

## 2021-10-09 NOTE — Addendum Note (Signed)
Addended by: Verlene Mayer A on: 10/09/2021 03:13 PM   Modules accepted: Orders

## 2021-10-09 NOTE — Telephone Encounter (Signed)
Will get scheduled for botox. Message sent

## 2021-10-09 NOTE — Telephone Encounter (Signed)
Patient scheduled for UA/UCX and Botox. Patient aware to pick up Cipro per Botox protocol. Sent to pharmacy. Voiced  understanding.

## 2021-10-10 ENCOUNTER — Other Ambulatory Visit: Payer: Self-pay

## 2021-10-10 DIAGNOSIS — N3946 Mixed incontinence: Secondary | ICD-10-CM

## 2021-10-11 ENCOUNTER — Other Ambulatory Visit: Payer: Self-pay

## 2021-10-11 ENCOUNTER — Other Ambulatory Visit: Payer: Medicare HMO

## 2021-10-11 DIAGNOSIS — N3946 Mixed incontinence: Secondary | ICD-10-CM

## 2021-10-11 LAB — URINALYSIS, COMPLETE
Bilirubin, UA: NEGATIVE
Glucose, UA: NEGATIVE
Nitrite, UA: NEGATIVE
Protein,UA: NEGATIVE
RBC, UA: NEGATIVE
Specific Gravity, UA: 1.015 (ref 1.005–1.030)
Urobilinogen, Ur: 0.2 mg/dL (ref 0.2–1.0)
pH, UA: 6.5 (ref 5.0–7.5)

## 2021-10-11 LAB — MICROSCOPIC EXAMINATION: Renal Epithel, UA: NONE SEEN /hpf

## 2021-10-15 ENCOUNTER — Ambulatory Visit: Payer: Medicare HMO

## 2021-10-17 LAB — CULTURE, URINE COMPREHENSIVE

## 2021-10-21 ENCOUNTER — Telehealth: Payer: Self-pay | Admitting: *Deleted

## 2021-10-21 MED ORDER — NITROFURANTOIN MACROCRYSTAL 100 MG PO CAPS: 100.0000 mg | ORAL_CAPSULE | Freq: Two times a day (BID) | ORAL | 0 refills | Status: DC

## 2021-10-21 NOTE — Telephone Encounter (Addendum)
Patient advised, sent in RX to [facility]  ----- Message from Bjorn Loser, MD sent at 10/21/2021  8:06 AM EST ----- Macrodantin 100 mg twice a day for 7 days The patient may be having Botox soon FYI-  ----- Message ----- From: Shanon Ace, CMA Sent: 10/21/2021   8:00 AM EST To: Bjorn Loser, MD   ----- Message ----- From: Interface, [facility] Lab Results In Sent: 10/11/2021   4:36 PM EST To: Rowe Robert Clinical

## 2021-10-28 ENCOUNTER — Ambulatory Visit: Payer: Medicare HMO | Admitting: Urology

## 2021-11-20 ENCOUNTER — Other Ambulatory Visit: Payer: Self-pay

## 2021-11-20 ENCOUNTER — Ambulatory Visit
Admission: RE | Admit: 2021-11-20 | Discharge: 2021-11-20 | Disposition: A | Discharge status: Home / Self Care | Payer: Medicare HMO | Source: Ambulatory Visit | Attending: Internal Medicine | Admitting: Internal Medicine

## 2021-11-20 DIAGNOSIS — Z1231 Encounter for screening mammogram for malignant neoplasm of breast: Secondary | ICD-10-CM | POA: Insufficient documentation

## 2021-12-09 ENCOUNTER — Other Ambulatory Visit: Payer: Medicare HMO

## 2021-12-09 ENCOUNTER — Other Ambulatory Visit: Payer: Self-pay

## 2021-12-09 DIAGNOSIS — N3946 Mixed incontinence: Secondary | ICD-10-CM

## 2021-12-09 DIAGNOSIS — N3941 Urge incontinence: Secondary | ICD-10-CM

## 2021-12-09 LAB — URINALYSIS, COMPLETE
Bilirubin, UA: NEGATIVE
Glucose, UA: NEGATIVE
Nitrite, UA: NEGATIVE
Specific Gravity, UA: 1.015 (ref 1.005–1.030)
Urobilinogen, Ur: 0.2 mg/dL (ref 0.2–1.0)
pH, UA: 7 (ref 5.0–7.5)

## 2021-12-09 LAB — MICROSCOPIC EXAMINATION: RBC, Urine: >30 /hpf — AB (ref 0–2)

## 2021-12-11 LAB — CULTURE, URINE COMPREHENSIVE

## 2021-12-12 ENCOUNTER — Telehealth: Payer: Self-pay | Admitting: *Deleted

## 2021-12-12 NOTE — Telephone Encounter (Signed)
PA not required for Botox procedure PA approved for RX 4 visits for Botox #[id number]

## 2021-12-12 NOTE — Telephone Encounter (Addendum)
Left VM to return call Dr. Matilde Sprang would like to prescribed Keflex 582m TID x 7 days  ----- Message from SBjorn Loser MD sent at 12/12/2021  3:14 PM EST ----- Can this patient take Keflex ----- Message ----- From: OAlvera Novel CMA Sent: 12/11/2021   8:08 AM EST To: SBjorn Loser MD   ----- Message ----- From: Interface, [facility] Lab Results In Sent: 12/09/2021   4:36 PM EST To: AAlvera Novel CMA

## 2021-12-13 ENCOUNTER — Telehealth: Payer: Self-pay

## 2021-12-13 MED ORDER — CEPHALEXIN 500 MG PO CAPS: 500.0000 mg | ORAL_CAPSULE | Freq: Three times a day (TID) | ORAL | 0 refills | Status: AC

## 2021-12-13 NOTE — Telephone Encounter (Signed)
Pt reaction to penicillin is hives. She does not remember if she has ever had keflex.

## 2021-12-13 NOTE — Telephone Encounter (Signed)
Patient was seen at ENT. Pt states the ENT does not want her to take keflex and was given Doxycyline for her ear and would like to take doxycyline

## 2021-12-13 NOTE — Telephone Encounter (Signed)
OK to send in per dr.MacDiarmid.

## 2021-12-16 ENCOUNTER — Ambulatory Visit: Payer: Medicare HMO | Admitting: Urology

## 2022-02-12 ENCOUNTER — Other Ambulatory Visit: Payer: Self-pay | Admitting: Otolaryngology

## 2022-02-12 DIAGNOSIS — G35 Multiple sclerosis: Secondary | ICD-10-CM

## 2022-02-12 DIAGNOSIS — H905 Unspecified sensorineural hearing loss: Secondary | ICD-10-CM

## 2022-03-03 ENCOUNTER — Ambulatory Visit: Payer: Medicare HMO | Admitting: Urology

## 2022-03-03 ENCOUNTER — Encounter: Payer: Self-pay | Admitting: Urology

## 2022-03-03 VITALS — BP 148/84 | HR 76 | Ht 63.0 in

## 2022-03-03 DIAGNOSIS — N3001 Acute cystitis with hematuria: Secondary | ICD-10-CM

## 2022-03-03 DIAGNOSIS — N9489 Other specified conditions associated with female genital organs and menstrual cycle: Secondary | ICD-10-CM | POA: Diagnosis not present

## 2022-03-03 DIAGNOSIS — N3946 Mixed incontinence: Secondary | ICD-10-CM | POA: Diagnosis not present

## 2022-03-03 LAB — MICROSCOPIC EXAMINATION
Bacteria, UA: NONE SEEN
RBC, Urine: >30 /hpf — AB (ref 0–2)

## 2022-03-03 LAB — URINALYSIS, COMPLETE
Bilirubin, UA: NEGATIVE
Glucose, UA: NEGATIVE
Ketones, UA: NEGATIVE
Nitrite, UA: NEGATIVE
Protein,UA: NEGATIVE
Specific Gravity, UA: 1.015 (ref 1.005–1.030)
Urobilinogen, Ur: 0.2 mg/dL (ref 0.2–1.0)
pH, UA: 7 (ref 5.0–7.5)

## 2022-03-03 MED ORDER — NITROFURANTOIN MACROCRYSTAL 100 MG PO CAPS: 100.0000 mg | ORAL_CAPSULE | Freq: Two times a day (BID) | ORAL | 0 refills | Status: DC

## 2022-03-03 NOTE — Progress Notes (Signed)
? ?03/03/2022 ?1:04 PM  ? ?Ashlee Fields ?10-03-1956 ?[id number] ? ?Referring provider: [name], MD [facility],  [place] [address] ? ?No chief complaint on file. ? ? ?HPI: ?Reviewed chart.  Patient soaks a number of pads a day with urge incontinence.  90% better on last Botox treatment.  She is functional issues and high-volume incontinence.  Could not afford percutaneous tibial nerve stimulation.  Patient had a positive culture last week and is on the antibiotic appropriately. ?  ?Incontinence persisting.  Clinically not infected   ? ?Spoke to patient November with telehealth visit.  First 2 Botox were quite well.  Last Botox was in May 2022 ? ?Today ?Patient had a hematuria CT scan in April 2021 and had a small nonobstructing stone right kidney.  She had a positive urine culture with low count of Streptococcus in January 2023.  She had a positive culture with Enterococcus in November 2022 sensitive to Macrodantin and penicillin ? ?She actually is doing well with her urge incontinence.  She stays quite dry but then has spells where she has urge incontinence.  Since Friday she has gross hematuria.  She was having some suprapubic discomfort that is settled down.  No burning or fever ? ? ? ? ?PMH: ?Past Medical History:  ?Diagnosis Date  ? Anxiety   ? Arthritis   ? Depression   ? Diabetes mellitus without complication ([place])   ? GERD (gastroesophageal reflux disease)   ? Hypertension   ? Hypothyroidism   ? Multiple sclerosis ([place]) 1980  ? Osteoporosis   ? Sleep apnea   ? Vision abnormalities   ? ? ?Surgical History: ?Past Surgical History:  ?Procedure Laterality Date  ? CESAREAN SECTION    ? COLONOSCOPY    ? ESOPHAGOGASTRODUODENOSCOPY (EGD) WITH PROPOFOL N/A 11/20/2020  ? Procedure: ESOPHAGOGASTRODUODENOSCOPY (EGD) WITH PROPOFOL;  Surgeon: [name], MD;  Location: [facility] ENDOSCOPY;  Service: Endoscopy;  Laterality: N/A;  ? JOINT REPLACEMENT    ? REPLACEMENT TOTAL  KNEE Left 2009  ? RIGHT HEART CATH Right 08/05/2017  ? Procedure: RIGHT HEART CATH;  Surgeon: Teodoro Spray, MD;  Location: [facility] [facility];  Service: Cardiovascular;  Laterality: Right;  ? TOTAL KNEE ARTHROPLASTY Right 02/28/2020  ? Procedure: TOTAL KNEE ARTHROPLASTY;  Surgeon: Thornton Park, MD;  Location: [facility] ORS;  Service: Orthopedics;  Laterality: Right;  ? ? ?Home Medications:  ?Allergies as of 03/03/2022   ? ?   Reactions  ? Other Hives  ? succinylsulfathiazole  ? Penicillins Hives  ? Did it involve swelling of the face/tongue/throat, SOB, or low BP? No ?Did it involve sudden or severe rash/hives, skin peeling, or any reaction on the inside of your mouth or nose? Yes ?Did you need to seek medical attention at a hospital or doctor's office? Unknown ?When did it last happen?      20 + years ?If all above answers are ?NO?, may proceed with cephalosporin use.  ? Sulfa Antibiotics Hives, Nausea And Vomiting  ? Ibuprofen Rash  ? ?  ? ?  ?Medication List  ?  ? ?  ? Accurate as of March 03, 2022  1:04 PM. If you have any questions, ask your nurse or doctor.  ?  ?  ? ?  ? ?acetaminophen 500 MG tablet ?Commonly known as: TYLENOL ?Take 2 tablets (1,000 mg total) by mouth 3 (three) times daily. ?  ?alendronate 70 MG tablet ?Commonly known as: FOSAMAX ?Take 70  mg by mouth once a week. Take with a full glass of water on an empty stomach.  ?sunday ?  ?amantadine 100 MG capsule ?Commonly known as: SYMMETREL ?Take 100 mg by mouth daily. ?  ?ascorbic acid 500 MG tablet ?Commonly known as: VITAMIN C ?Take 500 mg by mouth daily. ?  ?atorvastatin 80 MG tablet ?Commonly known as: LIPITOR ?Take 80 mg by mouth at bedtime. ?  ?baclofen 20 MG tablet ?Commonly known as: LIORESAL ?Take 40 mg by mouth at bedtime. ?  ?bisoprolol 5 MG tablet ?Commonly known as: ZEBETA ?Take 5 mg by mouth daily. ?  ?calcium-vitamin D 500-200 MG-UNIT tablet ?Commonly known as: OSCAL WITH D ?Take 1 tablet by mouth 2 (two) times daily. With meals ?   ?ciprofloxacin 500 MG tablet ?Commonly known as: CIPRO ?Take 1 tablet day prior to procedure, day of procedure and day after ?  ?cyanocobalamin 1000 MCG/ML injection ?Commonly known as: (VITAMIN B-12) ?Inject 1,000 mcg into the muscle every 30 (thirty) days. ?  ?diazepam 2 MG tablet ?Commonly known as: VALIUM ?Take 2 mg by mouth 4 (four) times daily as needed for muscle spasms. ?  ?famotidine 40 MG tablet ?Commonly known as: PEPCID ?Take 40 mg by mouth at bedtime. ?  ?FLUoxetine 20 MG capsule ?Commonly known as: PROZAC ?Take 60 mg by mouth daily. ?  ?gabapentin 300 MG capsule ?Commonly known as: NEURONTIN ?Take 1 capsule by mouth 2 (two) times daily. ?  ?hydrochlorothiazide 25 MG tablet ?Commonly known as: HYDRODIURIL ?Take 25 mg by mouth daily. ?  ?levothyroxine 50 MCG tablet ?Commonly known as: SYNTHROID ?Take 50 mcg by mouth daily before breakfast. ?  ?LORazepam 1 MG tablet ?Commonly known as: ATIVAN ?Take 1 mg by mouth 2 (two) times daily. ?  ?metFORMIN 500 MG 24 hr tablet ?Commonly known as: GLUCOPHAGE-XR ?Take 1,500 mg by mouth at bedtime. ?  ?methylphenidate 10 MG tablet ?Commonly known as: RITALIN ?Take 10 mg by mouth 2 (two) times daily. ?  ?methylphenidate 10 MG tablet ?Commonly known as: RITALIN ?Take by mouth. ?  ?multivitamin with minerals tablet ?Take 1 tablet by mouth daily. ?  ?ondansetron 4 MG disintegrating tablet ?Commonly known as: ZOFRAN-ODT ?Take 4 mg by mouth every 8 (eight) hours as needed for nausea/vomiting. ?  ?Ozempic (0.25 or 0.5 MG/DOSE) 2 MG/1.5ML Sopn ?Generic drug: Semaglutide(0.25 or 0.5MG/DOS) ?Inject into the skin. ?  ?pantoprazole 40 MG tablet ?Commonly known as: PROTONIX ?Take 40 mg by mouth daily. ?  ?pantoprazole 40 MG tablet ?Commonly known as: PROTONIX ?Take 1 tablet by mouth 2 (two) times daily. ?  ?Phendimetrazine Tartrate 35 MG Tabs ?Take 105 mg by mouth daily. ?  ?predniSONE 10 MG tablet ?Commonly known as: DELTASONE ?Take by mouth. ?  ?Prolia 60 MG/ML Sosy  injection ?Generic drug: denosumab ?Inject into the skin. ?  ?traMADol 50 MG tablet ?Commonly known as: ULTRAM ?Take by mouth every 6 (six) hours as needed. ?  ?valsartan-hydrochlorothiazide 320-12.5 MG tablet ?Commonly known as: DIOVAN-HCT ?Take 1 tablet by mouth daily. ?  ? ?  ? ? ?Allergies:  ?Allergies  ?Allergen Reactions  ? Other Hives  ?  succinylsulfathiazole  ? Penicillins Hives  ?  Did it involve swelling of the face/tongue/throat, SOB, or low BP? No ?Did it involve sudden or severe rash/hives, skin peeling, or any reaction on the inside of your mouth or nose? Yes ?Did you need to seek medical attention at a hospital or doctor's office? Unknown ?When did it last happen?  20 + years ?If all above answers are ?NO?, may proceed with cephalosporin use. ? ?  ? Sulfa Antibiotics Hives and Nausea And Vomiting  ? Ibuprofen Rash  ? ? ?Family History: ?Family History  ?Problem Relation Age of Onset  ? Breast cancer Paternal Grandmother   ? Heart disease Mother   ? Kidney failure Father   ? Colon cancer Maternal Grandmother   ? ? ?Social History:  reports that she quit smoking about 5 years ago. Her smoking use included cigarettes. She has never used smokeless tobacco. She reports that she does not drink alcohol and does not use drugs. ? ?ROS: ?  ? ?  ? ?  ? ?  ? ?  ? ?  ? ?  ? ?  ? ?  ? ?  ? ?  ? ?  ? ?  ? ?Physical Exam: ?There were no vitals taken for this visit.  ?Constitutional:  Alert and oriented, No acute distress. ? ? ?Laboratory Data: ?Lab Results  ?Component Value Date  ? WBC 11.6 (H) 03/01/2020  ? HGB 10.0 (L) 03/01/2020  ? HCT 31.3 (L) 03/01/2020  ? MCV 93.2 03/01/2020  ? PLT 333 03/01/2020  ? ? ?Lab Results  ?Component Value Date  ? CREATININE 0.63 02/29/2020  ? ? ?No results found for: PSA ? ?No results found for: TESTOSTERONE ? ?No results found for: HGBA1C ? ?Urinalysis ?   ?Component Value Date/Time  ? COLORURINE RED (A) 02/29/2020 0130  ? APPEARANCEUR Cloudy (A) 12/09/2021 1116  ? LABSPEC 1.025  02/29/2020 0130  ? PHURINE 6.0 02/29/2020 0130  ? GLUCOSEU Negative 12/09/2021 1116  ? HGBUR LARGE (A) 02/29/2020 0130  ? BILIRUBINUR Negative 12/09/2021 1116  ? [place] NEGATIVE 02/29/2020 0130  ? PROTEINUR 1+ (

## 2022-03-06 ENCOUNTER — Ambulatory Visit
Admission: RE | Admit: 2022-03-06 | Discharge: 2022-03-06 | Disposition: A | Discharge status: Home / Self Care | Payer: Medicare HMO | Source: Ambulatory Visit | Attending: Otolaryngology | Admitting: Otolaryngology

## 2022-03-06 DIAGNOSIS — H905 Unspecified sensorineural hearing loss: Secondary | ICD-10-CM

## 2022-03-06 DIAGNOSIS — G35 Multiple sclerosis: Secondary | ICD-10-CM

## 2022-03-06 MED ORDER — GADOBENATE DIMEGLUMINE 529 MG/ML IV SOLN
18.0000 mL | Freq: Once | INTRAVENOUS | Status: AC | PRN
  Administered 2022-03-06: 18 mL via INTRAVENOUS

## 2022-03-07 LAB — CULTURE, URINE COMPREHENSIVE

## 2022-03-25 ENCOUNTER — Encounter: Payer: Self-pay | Admitting: Physician Assistant

## 2022-03-25 ENCOUNTER — Ambulatory Visit: Payer: Medicare HMO | Admitting: Physician Assistant

## 2022-03-25 VITALS — BP 157/95 | HR 77 | Ht 63.0 in | Wt 190.0 lb

## 2022-03-25 DIAGNOSIS — R310 Gross hematuria: Secondary | ICD-10-CM

## 2022-03-25 DIAGNOSIS — N3941 Urge incontinence: Secondary | ICD-10-CM | POA: Diagnosis not present

## 2022-03-25 LAB — URINALYSIS, COMPLETE
Bilirubin, UA: NEGATIVE
Glucose, UA: NEGATIVE
Ketones, UA: NEGATIVE
Leukocytes,UA: NEGATIVE
Nitrite, UA: NEGATIVE
Protein,UA: NEGATIVE
RBC, UA: NEGATIVE
Specific Gravity, UA: 1.02 (ref 1.005–1.030)
Urobilinogen, Ur: 0.2 mg/dL (ref 0.2–1.0)
pH, UA: 7.5 (ref 5.0–7.5)

## 2022-03-25 LAB — MICROSCOPIC EXAMINATION: Bacteria, UA: NONE SEEN

## 2022-03-25 NOTE — Progress Notes (Signed)
? ?03/25/2022 ?4:20 PM  ? ?Ashlee Fields ?1956/04/15 ?[id number] ? ?CC: ?Chief Complaint  ?Patient presents with  ? Urinary Incontinence  ? ?HPI: ?Ashlee Fields is a 66 y.o. female with PMH OAB managed with intravesical Botox with Dr. Matilde Sprang and recent history of gross hematuria possibly secondary to UTI who presents today for urine recheck after completing empiric Macrobid.  ? ?Today she reports having completed Macrobid as prescribed.  Her gross hematuria has resolved.  She denies any lower abdominal discomfort and feels well today. ? ?She is due for her next intravesical Botox treatment later this month, however she reports that her symptoms are currently rather mild and she only has 2 "gushing" episodes per month.  Based on this, she would like to cancel this treatment and schedule as needed. ? ?In-office UA and microscopy pan negative. ? ?PMH: ?Past Medical History:  ?Diagnosis Date  ? Anxiety   ? Arthritis   ? Depression   ? Diabetes mellitus without complication ([place])   ? GERD (gastroesophageal reflux disease)   ? Hypertension   ? Hypothyroidism   ? Multiple sclerosis ([place]) 1980  ? Osteoporosis   ? Sleep apnea   ? Vision abnormalities   ? ? ?Surgical History: ?Past Surgical History:  ?Procedure Laterality Date  ? CESAREAN SECTION    ? COLONOSCOPY    ? ESOPHAGOGASTRODUODENOSCOPY (EGD) WITH PROPOFOL N/A 11/20/2020  ? Procedure: ESOPHAGOGASTRODUODENOSCOPY (EGD) WITH PROPOFOL;  Surgeon: [name], MD;  Location: [facility];  Service: Endoscopy;  Laterality: N/A;  ? JOINT REPLACEMENT    ? REPLACEMENT TOTAL KNEE Left 2009  ? RIGHT HEART CATH Right 08/05/2017  ? Procedure: RIGHT HEART CATH;  Surgeon: Teodoro Spray, MD;  Location: [facility];  Service: Cardiovascular;  Laterality: Right;  ? TOTAL KNEE ARTHROPLASTY Right 02/28/2020  ? Procedure: TOTAL KNEE ARTHROPLASTY;  Surgeon: Thornton Park, MD;  Location: [facility];  Service: Orthopedics;  Laterality: Right;  ? ? ?Home Medications:   ?Allergies as of 03/25/2022   ? ?   Reactions  ? Other Hives  ? succinylsulfathiazole  ? Penicillins Hives  ? Did it involve swelling of the face/tongue/throat, SOB, or low BP? No ?Did it involve sudden or severe rash/hives, skin peeling, or any reaction on the inside of your mouth or nose? Yes ?Did you need to seek medical attention at a hospital or doctor's office? Unknown ?When did it last happen?      20 + years ?If all above answers are ?NO?, may proceed with cephalosporin use.  ? Sulfa Antibiotics Hives, Nausea And Vomiting  ? Ibuprofen Rash  ? ?  ? ?  ?Medication List  ?  ? ?  ? Accurate as of Mar 25, 2022  4:20 PM. If you have any questions, ask your nurse or doctor.  ?  ?  ? ?  ? ?STOP taking these medications   ? ?ciprofloxacin 500 MG tablet ?Commonly known as: CIPRO ?Stopped by: Debroah Loop, PA-C ?  ?nitrofurantoin 100 MG capsule ?Commonly known as: Macrodantin ?Stopped by: Debroah Loop, PA-C ?  ? ?  ? ?TAKE these medications   ? ?acetaminophen 500 MG tablet ?Commonly known as: TYLENOL ?Take 2 tablets (1,000 mg total) by mouth 3 (three) times daily. ?  ?ascorbic acid 500 MG tablet ?Commonly known as: VITAMIN C ?Take 500 mg by mouth daily. ?  ?atorvastatin 80 MG tablet ?Commonly known as: LIPITOR ?Take 80 mg by mouth at bedtime. ?  ?baclofen 20 MG tablet ?  Commonly known as: LIORESAL ?Take 40 mg by mouth at bedtime. ?  ?bisoprolol 5 MG tablet ?Commonly known as: ZEBETA ?Take 5 mg by mouth daily. ?  ?calcium-vitamin D 500-200 MG-UNIT tablet ?Commonly known as: OSCAL WITH D ?Take 1 tablet by mouth 2 (two) times daily. With meals ?  ?cyanocobalamin 1000 MCG/ML injection ?Commonly known as: (VITAMIN B-12) ?Inject 1,000 mcg into the muscle every 30 (thirty) days. ?  ?diazepam 2 MG tablet ?Commonly known as: VALIUM ?Take 2 mg by mouth 4 (four) times daily as needed for muscle spasms. ?  ?famotidine 40 MG tablet ?Commonly known as: PEPCID ?Take 40 mg by mouth at bedtime. ?  ?FLUoxetine 20 MG  capsule ?Commonly known as: PROZAC ?Take 60 mg by mouth daily. ?  ?gabapentin 300 MG capsule ?Commonly known as: NEURONTIN ?Take 1 capsule by mouth 2 (two) times daily. ?  ?hydrochlorothiazide 25 MG tablet ?Commonly known as: HYDRODIURIL ?Take 25 mg by mouth daily. ?  ?levothyroxine 50 MCG tablet ?Commonly known as: SYNTHROID ?Take 50 mcg by mouth daily before breakfast. ?  ?LORazepam 1 MG tablet ?Commonly known as: ATIVAN ?Take 1 mg by mouth 2 (two) times daily. ?  ?metFORMIN 500 MG 24 hr tablet ?Commonly known as: GLUCOPHAGE-XR ?Take 1,500 mg by mouth at bedtime. ?  ?methylphenidate 10 MG tablet ?Commonly known as: RITALIN ?Take 10 mg by mouth 2 (two) times daily. ?  ?multivitamin with minerals tablet ?Take 1 tablet by mouth daily. ?  ?ondansetron 4 MG disintegrating tablet ?Commonly known as: ZOFRAN-ODT ?Take 4 mg by mouth every 8 (eight) hours as needed for nausea/vomiting. ?  ?Ozempic (0.25 or 0.5 MG/DOSE) 2 MG/1.5ML Sopn ?Generic drug: Semaglutide(0.25 or 0.5MG/DOS) ?Inject into the skin. ?  ?pantoprazole 40 MG tablet ?Commonly known as: PROTONIX ?Take 1 tablet by mouth 2 (two) times daily. ?  ?Phendimetrazine Tartrate 35 MG Tabs ?Take 105 mg by mouth daily. ?  ?predniSONE 10 MG tablet ?Commonly known as: DELTASONE ?Take by mouth. ?  ?Prolia 60 MG/ML Sosy injection ?Generic drug: denosumab ?Inject into the skin. ?  ?traMADol 50 MG tablet ?Commonly known as: ULTRAM ?Take by mouth every 6 (six) hours as needed. ?  ?valsartan-hydrochlorothiazide 320-12.5 MG tablet ?Commonly known as: DIOVAN-HCT ?Take 1 tablet by mouth daily. ?  ? ?  ? ? ?Allergies:  ?Allergies  ?Allergen Reactions  ? Other Hives  ?  succinylsulfathiazole  ? Penicillins Hives  ?  Did it involve swelling of the face/tongue/throat, SOB, or low BP? No ?Did it involve sudden or severe rash/hives, skin peeling, or any reaction on the inside of your mouth or nose? Yes ?Did you need to seek medical attention at a hospital or doctor's office?  Unknown ?When did it last happen?      20 + years ?If all above answers are ?NO?, may proceed with cephalosporin use. ? ?  ? Sulfa Antibiotics Hives and Nausea And Vomiting  ? Ibuprofen Rash  ? ? ?Family History: ?Family History  ?Problem Relation Age of Onset  ? Breast cancer Paternal Grandmother   ? Heart disease Mother   ? Kidney failure Father   ? Colon cancer Maternal Grandmother   ? ? ?Social History:  ? reports that she quit smoking about 5 years ago. Her smoking use included cigarettes. She has never used smokeless tobacco. She reports that she does not drink alcohol and does not use drugs. ? ?Physical Exam: ?BP (!) 157/95   Pulse 77   Ht _0  (1.6 m)  Wt 190 lb (86.2 kg)   BMI 33.66 kg/m?   ?Constitutional:  Alert and oriented, no acute distress, nontoxic appearing ?HEENT: [place], AT ?Cardiovascular: No clubbing, cyanosis, or edema ?Respiratory: Normal respiratory effort, no increased work of breathing ?Skin: No rashes, bruises or suspicious lesions ?Neurologic: Grossly intact, no focal deficits, moving all 4 extremities ?Psychiatric: Normal mood and affect ? ?Laboratory Data: ?Results for orders placed or performed in visit on 03/25/22  ?Microscopic Examination  ? Urine  ?Result Value Ref Range  ? WBC, UA 0-5 0 - 5 /hpf  ? RBC 0-2 0 - 2 /hpf  ? Epithelial Cells (non renal) 0-10 0 - 10 /hpf  ? Bacteria, UA None seen None seen/Few  ?Urinalysis, Complete  ?Result Value Ref Range  ? Specific Gravity, UA 1.020 1.005 - 1.030  ? pH, UA 7.5 5.0 - 7.5  ? Color, UA Yellow Yellow  ? Appearance Ur Clear Clear  ? Leukocytes,UA Negative Negative  ? Protein,UA Negative Negative/Trace  ? Glucose, UA Negative Negative  ? Ketones, UA Negative Negative  ? RBC, UA Negative Negative  ? Bilirubin, UA Negative Negative  ? Urobilinogen, Ur 0.2 0.2 - 1.0 mg/dL  ? Nitrite, UA Negative Negative  ? Microscopic Examination See below:   ? ?Assessment & Plan:   ?1. Gross hematuria ?Resolved after completing Macrobid.  She is no longer  symptomatic.  We discussed return precautions including new gross hematuria, flank pain, or dysuria.  Will defer cystoscopy and CT scan at this time.  She is in agreement with this plan. ?- Urinalysis, Complete ? ?2. Ur

## 2022-04-03 ENCOUNTER — Other Ambulatory Visit: Payer: Medicare HMO

## 2022-04-07 ENCOUNTER — Ambulatory Visit: Payer: Self-pay | Admitting: Physician Assistant

## 2022-04-14 ENCOUNTER — Ambulatory Visit: Payer: Medicare HMO | Admitting: Urology

## 2022-08-11 ENCOUNTER — Other Ambulatory Visit: Payer: Self-pay | Admitting: Internal Medicine

## 2022-08-11 DIAGNOSIS — Z1231 Encounter for screening mammogram for malignant neoplasm of breast: Secondary | ICD-10-CM

## 2022-08-22 ENCOUNTER — Telehealth: Payer: Self-pay | Admitting: Urology

## 2022-08-22 NOTE — Telephone Encounter (Signed)
Spoke with patient and advised she would need to be approved for Botox and I would need to discuss with Macdiarmid/Sam. At pt's last OV she declined Botox.  Pt states she may not need it but she just can't control her bladder. Pt also asked about " the cream" which is estradiol. Pt states she only uses the cream for Botox? I advised she should be using it 3 times per week and went over instructions. Pt opted to see Sam on 09/01/2022 for OAB because the original appt on 09/08/22 she can't come and needed to reschedule anyway. I advised pt we will get a urine sample the day of her appt.

## 2022-08-22 NOTE — Telephone Encounter (Signed)
Pt called in, an upcoming appt was made for 10/16 at 10:00 am for Botox injection.  Pt wanted to know if she can start the cream? Also, when does she need to bring in a UA before the appt?  Please call [phone]

## 2022-09-01 ENCOUNTER — Telehealth: Payer: Self-pay

## 2022-09-01 ENCOUNTER — Ambulatory Visit: Payer: Medicare HMO | Admitting: Physician Assistant

## 2022-09-01 VITALS — BP 164/91 | HR 76 | Ht 63.0 in | Wt 190.0 lb

## 2022-09-01 DIAGNOSIS — N3941 Urge incontinence: Secondary | ICD-10-CM

## 2022-09-01 LAB — BLADDER SCAN AMB NON-IMAGING: Scan Result: 16

## 2022-09-01 NOTE — Telephone Encounter (Signed)
STACEYANN KNOUFF [id number] 14-Jul-1956  Provider & NPI:__Scott MacDirmid [id number]  Procedure [id number] Drug UPJS:R1594  OAB:100 units Neurogenic Bladder:200 units  Insurance: ___[employer]_____________________________________________________________________________ Policy#:__[id number]___________________________________________________________________ Telephone:___[phone]________________________________________________________________ Spoke to:_Myka C_______________________     Approved    Denied  Reference#:_[id number]______________________________________________________________________ Notes:__Authorization not required _______________ Person collecting information name:_S.Shaquandra Galano, CMA Date:__10/09/2023_______________________   Patient aware of appointment.

## 2022-09-01 NOTE — Progress Notes (Unsigned)
09/01/2022 1:26 PM   Ashlee Fields [id number]  CC: Chief Complaint  Patient presents with   Follow-up   HPI: Ashlee Fields is a 66 y.o. female with PMH OAB was not managed with intravesical Botox by Dr. Matilde Sprang and gross hematuria possibly secondary to UTI who presents today for symptom recheck and to discuss Botox retreatment.   She previously deferred 9-monthBotox injections due to treatment durability. Today she reports her urinary symptoms have been back at her baseline for the past 3 weeks.  She describes high-volume, "gushing" leaks mostly at night.  She denies dysuria or gross hematuria.  In-office UA today positive for trace ketones, trace intact blood, trace protein, and 1+ leukocytes; urine microscopy with 6-10 WBCs/HPF, 3-10 RBCs/HPF, amorphous sediment, calcium oxalate crystals, and moderate bacteria. PVR 161m  PMH: Past Medical History:  Diagnosis Date   Anxiety    Arthritis    Depression    [place]   GERD (gastroesophageal reflux disease)    Hypertension    Hypothyroidism    [place]   Osteoporosis    Sleep apnea    Vision abnormalities     Surgical History: Past Surgical History:  Procedure Laterality Date   CESAREAN SECTION     COLONOSCOPY     ESOPHAGOGASTRODUODENOSCOPY (EGD) WITH PROPOFOL N/A 11/20/2020   Procedure: ESOPHAGOGASTRODUODENOSCOPY (EGD) WITH PROPOFOL;  Surgeon: LoLesly RubensteinMD;  Location: [facility];  Service: Endoscopy;  Laterality: N/A;   JOINT REPLACEMENT     REPLACEMENT TOTAL KNEE Left 2009   RIGHT HEART CATH Right 08/05/2017   Procedure: RIGHT HEART CATH;  Surgeon: FaTeodoro SprayMD;  [facility];  Service: Cardiovascular;  Laterality: Right;   TOTAL KNEE ARTHROPLASTY Right 02/28/2020   Procedure: TOTAL KNEE ARTHROPLASTY;  Surgeon: KrThornton ParkMD;  Location: [facility];  Service: Orthopedics;  Laterality: Right;    Home  Medications:  Allergies as of 09/01/2022       Reactions   Penicillins Hives   Did it involve swelling of the face/tongue/throat, SOB, or low BP? No Did it involve sudden or severe rash/hives, skin peeling, or any reaction on the inside of your mouth or nose? Yes Did you need to seek medical attention at a hospital or doctor's office? Unknown When did it last happen?      20 + years If all above answers are "NO", may proceed with cephalosporin use.   Other Hives   succinylsulfathiazole   Ibuprofen Rash   Sulfa Antibiotics Hives, Nausea And Vomiting, Itching, Rash        Medication List        Accurate as of September 01, 2022  1:26 PM. If you have any questions, ask your nurse or doctor.          acetaminophen 500 MG tablet Commonly known as: TYLENOL Take 2 tablets (1,000 mg total) by mouth 3 (three) times daily.   ascorbic acid 500 MG tablet Commonly known as: VITAMIN C Take 500 mg by mouth daily.   atorvastatin 80 MG tablet Commonly known as: LIPITOR Take 80 mg by mouth at bedtime.   baclofen 20 MG tablet Commonly known as: LIORESAL Take 40 mg by mouth at bedtime.   bisoprolol 5 MG tablet Commonly known as: ZEBETA Take 5 mg by mouth daily.   calcium-vitamin D 500-200 MG-UNIT tablet Commonly known as: OSCAL WITH D Take 1 tablet by mouth 2 (two) times daily.  With meals   cyanocobalamin 1000 MCG/ML injection Commonly known as: VITAMIN B12 Inject 1,000 mcg into the muscle every 30 (thirty) days.   diazepam 2 MG tablet Commonly known as: VALIUM Take 2 mg by mouth 4 (four) times daily as needed for muscle spasms.   famotidine 40 MG tablet Commonly known as: PEPCID Take 40 mg by mouth at bedtime.   FLUoxetine 20 MG capsule Commonly known as: PROZAC Take 60 mg by mouth daily.   gabapentin 300 MG capsule Commonly known as: NEURONTIN Take 1 capsule by mouth 2 (two) times daily.   hydrochlorothiazide 25 MG tablet Commonly known as: HYDRODIURIL Take 25 mg  by mouth daily.   levothyroxine 50 MCG tablet Commonly known as: SYNTHROID Take 50 mcg by mouth daily before breakfast.   LORazepam 1 MG tablet Commonly known as: ATIVAN Take 1 mg by mouth 2 (two) times daily.   metFORMIN 500 MG 24 hr tablet Commonly known as: GLUCOPHAGE-XR Take 1,500 mg by mouth at bedtime.   methylphenidate 10 MG tablet Commonly known as: RITALIN Take 10 mg by mouth 2 (two) times daily.   multivitamin with minerals tablet Take 1 tablet by mouth daily.   ondansetron 4 MG disintegrating tablet Commonly known as: ZOFRAN-ODT Take 4 mg by mouth every 8 (eight) hours as needed for nausea/vomiting.   Ozempic (0.25 or 0.5 MG/DOSE) 2 MG/1.5ML Sopn Generic drug: Semaglutide(0.25 or 0.5MG/DOS) Inject into the skin.   pantoprazole 40 MG tablet Commonly known as: PROTONIX Take 1 tablet by mouth 2 (two) times daily.   Phendimetrazine Tartrate 35 MG Tabs Take 105 mg by mouth daily.   predniSONE 10 MG tablet Commonly known as: DELTASONE Take by mouth.   Prolia 60 MG/ML Sosy injection Generic drug: denosumab Inject into the skin.   traMADol 50 MG tablet Commonly known as: ULTRAM Take by mouth every 6 (six) hours as needed.   valsartan-hydrochlorothiazide 320-12.5 MG tablet Commonly known as: DIOVAN-HCT Take 1 tablet by mouth daily.        Allergies:  Allergies  Allergen Reactions   Penicillins Hives    Did it involve swelling of the face/tongue/throat, SOB, or low BP? No Did it involve sudden or severe rash/hives, skin peeling, or any reaction on the inside of your mouth or nose? Yes Did you need to seek medical attention at a hospital or doctor's office? Unknown When did it last happen?      20 + years If all above answers are "NO", may proceed with cephalosporin use.     Other Hives    succinylsulfathiazole   Ibuprofen Rash   Sulfa Antibiotics Hives, Nausea And Vomiting, Itching and Rash    Family History: Family History  Problem Relation  Age of Onset   Breast cancer Paternal Grandmother    Heart disease Mother    Kidney failure Father    Colon cancer Maternal Grandmother     Social History:   reports that she quit smoking about 5 years ago. Her smoking use included cigarettes. She has never used smokeless tobacco. She reports that she does not drink alcohol and does not use drugs.  Physical Exam: BP (!) 164/91   Pulse 76   Ht _0  (1.6 m)   Wt 190 lb (86.2 kg)   BMI 33.66 kg/m   Constitutional:  Alert and oriented, no acute distress, nontoxic appearing HEENT: , AT Cardiovascular: No clubbing, cyanosis, or edema Respiratory: Normal respiratory effort, no increased work of breathing Skin: No rashes, bruises or  suspicious lesions Neurologic: Grossly intact, no focal deficits, moving all 4 extremities Psychiatric: Normal mood and affect  Laboratory Data: Results for orders placed or performed in visit on 09/01/22  Microscopic Examination   Urine  Result Value Ref Range   WBC, UA 6-10 (A) 0 - 5 /hpf   RBC, Urine 3-10 (A) 0 - 2 /hpf   Epithelial Cells (non renal) 0-10 0 - 10 /hpf   Crystals Present (A) N/A   Crystal Type Amorphous Sediment N/A   Bacteria, UA Moderate (A) None seen/Few  Urinalysis, Complete  Result Value Ref Range   Specific Gravity, UA 1.020 1.005 - 1.030   pH, UA 7.0 5.0 - 7.5   Color, UA Yellow Yellow   Appearance Ur Hazy (A) Clear   Leukocytes,UA 1+ (A) Negative   Protein,UA Trace (A) Negative/Trace   Glucose, UA Negative Negative   Ketones, UA Trace (A) Negative   RBC, UA Trace (A) Negative   Bilirubin, UA Negative Negative   Urobilinogen, Ur 0.2 0.2 - 1.0 mg/dL   Nitrite, UA Negative Negative   Microscopic Examination See below:   Bladder Scan (Post Void Residual) in office  Result Value Ref Range   Scan Result 16    Assessment & Plan:   1. Urgency incontinence Symptoms now back at their baseline with most recent intravesical Botox about 17 months ago.  She would like to  schedule her next treatment, which is reasonable.  UA today with mild pyuria, microscopic hematuria, and bacteriuria, though she is not clinically infected today.  We discussed that this likely represents asymptomatic bacteriuria, which would does not require treatment unless she is undergoing urologic procedures.  Per Dr. Matilde Sprang, will treat based on culture results and obtain pre-Botox UA ~2 weeks prior to appt. - Urinalysis, Complete - Bladder Scan (Post Void Residual) in office - CULTURE, URINE COMPREHENSIVE  Return in about 4 weeks (around 09/29/2022) for Intravesical Botox with Dr. Matilde Sprang.  Debroah Loop, PA-C  [facility] [address], [place], [place] [address] [phone]

## 2022-09-01 NOTE — Patient Instructions (Addendum)
Try the Interdry product by [employer] to help with moisture in your skin folds., will call you with an appointment once this approved though your insurance.  Then someone from the office will contract regarding an appointment with Dr. Catha Brow

## 2022-09-02 LAB — URINALYSIS, COMPLETE
Bilirubin, UA: NEGATIVE
Glucose, UA: NEGATIVE
Nitrite, UA: NEGATIVE
Specific Gravity, UA: 1.02 (ref 1.005–1.030)
Urobilinogen, Ur: 0.2 mg/dL (ref 0.2–1.0)
pH, UA: 7 (ref 5.0–7.5)

## 2022-09-02 LAB — MICROSCOPIC EXAMINATION

## 2022-09-04 LAB — CULTURE, URINE COMPREHENSIVE

## 2022-09-08 ENCOUNTER — Ambulatory Visit: Payer: Medicare HMO | Admitting: Urology

## 2022-09-12 ENCOUNTER — Telehealth: Payer: Self-pay | Admitting: *Deleted

## 2022-09-12 DIAGNOSIS — Z9229 Personal history of other drug therapy: Secondary | ICD-10-CM

## 2022-09-12 MED ORDER — CIPROFLOXACIN HCL 500 MG PO TABS: ORAL_TABLET | ORAL | 0 refills | Status: DC

## 2022-09-12 NOTE — Telephone Encounter (Signed)
-----  Message from Debroah Loop, [place] sent at 09/10/2022  5:13 PM EDT ----- Can you please make sure she's scheduled for lab visit for UA 2 weeks prior to upcoming Botox with Dr. Matilde Sprang?

## 2022-09-12 NOTE — Telephone Encounter (Signed)
Spoke with patient and advised results Appt scheduled rx sent to pharmacy by e-script

## 2022-09-29 ENCOUNTER — Other Ambulatory Visit: Payer: Medicare HMO

## 2022-09-29 DIAGNOSIS — Z9229 Personal history of other drug therapy: Secondary | ICD-10-CM

## 2022-09-29 LAB — URINALYSIS, COMPLETE
Bilirubin, UA: NEGATIVE
Glucose, UA: NEGATIVE
Ketones, UA: NEGATIVE
Nitrite, UA: NEGATIVE
Specific Gravity, UA: 1.025 (ref 1.005–1.030)
Urobilinogen, Ur: 0.2 mg/dL (ref 0.2–1.0)
pH, UA: 7 (ref 5.0–7.5)

## 2022-09-29 LAB — MICROSCOPIC EXAMINATION

## 2022-10-02 LAB — CULTURE, URINE COMPREHENSIVE

## 2022-10-08 ENCOUNTER — Telehealth: Payer: Self-pay | Admitting: Physician Assistant

## 2022-10-08 NOTE — Telephone Encounter (Signed)
I'm so sorry, there was a mixup in the text on her AVS from her visit with me. I don't need to order the Interdry product I recommended for her; she can buy it herself online. They even have it [place]

## 2022-10-08 NOTE — Telephone Encounter (Signed)
.  left message to have patient return my call.

## 2022-10-08 NOTE — Telephone Encounter (Signed)
Pt called office asking if you had ordered a certain type of pad she could use?  Also, she said thank you so much for telling her about Aquaphor, it is the best ever!

## 2022-10-10 NOTE — Telephone Encounter (Signed)
Spoke with patient and she will check on [facility]

## 2022-10-13 ENCOUNTER — Encounter: Payer: Self-pay | Admitting: Urology

## 2022-10-13 ENCOUNTER — Ambulatory Visit: Payer: Medicare HMO | Admitting: Urology

## 2022-10-13 VITALS — BP 155/89 | HR 69 | Ht 63.0 in | Wt 190.0 lb

## 2022-10-13 DIAGNOSIS — N3001 Acute cystitis with hematuria: Secondary | ICD-10-CM

## 2022-10-13 DIAGNOSIS — Z9229 Personal history of other drug therapy: Secondary | ICD-10-CM

## 2022-10-13 DIAGNOSIS — N3941 Urge incontinence: Secondary | ICD-10-CM | POA: Diagnosis not present

## 2022-10-13 DIAGNOSIS — R310 Gross hematuria: Secondary | ICD-10-CM

## 2022-10-13 LAB — URINALYSIS, COMPLETE
Bilirubin, UA: NEGATIVE
Glucose, UA: NEGATIVE
Nitrite, UA: NEGATIVE
Specific Gravity, UA: 1.03 (ref 1.005–1.030)
Urobilinogen, Ur: 0.2 mg/dL (ref 0.2–1.0)
pH, UA: 7.5 (ref 5.0–7.5)

## 2022-10-13 LAB — MICROSCOPIC EXAMINATION: RBC, Urine: >30 /hpf — AB (ref 0–2)

## 2022-10-13 MED ORDER — CIPROFLOXACIN HCL 250 MG PO TABS: 250.0000 mg | ORAL_TABLET | Freq: Two times a day (BID) | ORAL | 0 refills | Status: AC

## 2022-10-13 MED ORDER — ONABOTULINUMTOXINA 100 UNITS IJ SOLR
100.0000 [IU] | Freq: Once | INTRAMUSCULAR | Status: AC
  Administered 2022-10-13: 100 [IU] via INTRAMUSCULAR

## 2022-10-13 NOTE — Progress Notes (Signed)
10/13/2022 10:36 AM   Ashlee Fields [birth year] [id number]  Referring provider: [name], MD [facility] [place],  [place] [address]  Chief Complaint  Patient presents with   Botulinum Toxin Injection    HPI: Reviewed chart.  Patient soaks a number of pads a day with urge incontinence.  90% better on last Botox treatment.  She is functional issues and high-volume incontinence.  Could not afford percutaneous tibial nerve stimulation.  Patient had a positive culture last week and is on the antibiotic appropriately.   Patient had a hematuria CT scan in April 2021 and had a small nonobstructing stone right kidney.  She had a positive urine culture with low count of Streptococcus in January 2023.  She had a positive culture with Enterococcus in November 2022 sensitive to Macrodantin and penicillin   She actually is doing well with her urge incontinence.  She stays quite dry but then has spells where she has urge incontinence.  Since Friday she has gross hematuria.  She was having some suprapubic discomfort that is settled down.  No burning or fever     Based on last cultures I called in Macrodantin 100 mg twice a day for 7 days.  See nurse practitioner in 3 weeks to make certain she is normalized.  Otherwise we will need a CT scan repeat and cystoscopy.  She does not take daily aspirin or blood thinner.  She quit smoking 5 years ago.  She is having tooth issues in my opinion not related   Today I last saw the patient April 2023.  Culture was negative.  No CT scan ordered.  It was discussed which she did clinically beautifully on the [facility] with nurse practitioner.  Other than a small nonobstructing stone in right kidney her CT scan April 2021 was normal.  She saw our nurse practitioner again in October 2023.  Her urine looked infected with microscopic hematuria.  Urine culture in October and in November was normal.  Urine culture April 2023  negative  Patient clinically is not infected today and urge incontinence with gushing persisting.  She describes being treated for a bladder infection 3 weeks ago and helping her incontinence.  She took perhaps more than 1 course of antibiotics and says ciprofloxacin for 5 days helped  The patient has limited mobility due to her multiple sclerosis.  At times she has hesitancy and this was discussed.  She talked about gasping episodes and this is her ongoing issues.  She thinks she probably get Botox every 6 months.  Certain we checked her flow symptoms and residual next visit  Cystoscopy: Patient underwent flexible cystoscopy.  Bladder mucosa and trigone were normal.  No obvious cystitis.  She is on her antibiotic starting yesterday.  I injected Botox 100 units and 10 cc in normal saline throughout the bladder primarily in the modified midline template.  She did find them tender.  There was minimal bleeding.  Overall she did well.   PMH: Past Medical History:  Diagnosis Date   Anxiety    Arthritis    Depression    Diabetes mellitus without complication (HCC)    GERD (gastroesophageal reflux disease)    Hypertension    Hypothyroidism    Multiple sclerosis ([place]) 1980   Osteoporosis    Sleep apnea    Vision abnormalities     Surgical History: Past Surgical History:  Procedure Laterality Date   CESAREAN SECTION     COLONOSCOPY  ESOPHAGOGASTRODUODENOSCOPY (EGD) WITH PROPOFOL N/A 11/20/2020   Procedure: ESOPHAGOGASTRODUODENOSCOPY (EGD) WITH PROPOFOL;  Surgeon: [name], MD;  Location: [facility] END[facility];  Service: Endoscopy;  Laterality: N/A;   JOINT REPLACEMENT     REPLACEMENT TOTAL KNEE Left 2009   RIGHT HEART CATH Right 08/05/2017   Procedure: RIGHT HEART CATH;  Surgeon: Teodoro Spray, MD;  Location: [place] [facility];  Service: Cardiovascular;  Laterality: Right;   TOTAL KNEE ARTHROPLASTY Right 02/28/2020   Procedure: TOTAL KNEE ARTHROPLASTY;  Surgeon: Thornton Park, MD;  Location: [facility] ORS;  Service: Orthopedics;  Laterality: Right;    Home Medications:  Allergies as of 10/13/2022       Reactions   Penicillins Hives   Did it involve swelling of the face/tongue/throat, SOB, or low BP? No Did it involve sudden or severe rash/hives, skin peeling, or any reaction on the inside of your mouth or nose? Yes Did you need to seek medical attention at a hospital or doctor's office? Unknown When did it last happen?      20 + years If all above answers are "NO", may proceed with cephalosporin use.   Other Hives   succinylsulfathiazole   Ibuprofen Rash   Sulfa Antibiotics Hives, Nausea And Vomiting, Itching, Rash        Medication List        Accurate as of October 13, 2022 10:36 AM. If you have any questions, ask your nurse or doctor.          STOP taking these medications    baclofen 20 MG tablet Commonly known as: LIORESAL Stopped by: Reece Packer, MD   ciprofloxacin 500 MG tablet Commonly known as: CIPRO Stopped by: Elayne Snare Remee Charley, MD   Ozempic (0.25 or 0.5 MG/DOSE) 2 MG/1.5ML Sopn Generic drug: Semaglutide(0.25 or 0.5MG/DOS) Stopped by: Reece Packer, MD       TAKE these medications    acetaminophen 500 MG tablet Commonly known as: TYLENOL Take 2 tablets (1,000 mg total) by mouth 3 (three) times daily.   ascorbic acid 500 MG tablet Commonly known as: VITAMIN C Take 500 mg by mouth daily.   atorvastatin 80 MG tablet Commonly known as: LIPITOR Take 80 mg by mouth at bedtime.   bisoprolol 10 MG tablet Commonly known as: ZEBETA Take 1 tablet by mouth daily. What changed: Another medication with the same name was removed. Continue taking this medication, and follow the directions you see here. Changed by: Reece Packer, MD   buPROPion 150 MG 24 hr tablet Commonly known as: WELLBUTRIN XL Take 150 mg by mouth every morning.   calcium-vitamin D 500-200 MG-UNIT tablet Commonly known as: OSCAL  WITH D Take 1 tablet by mouth 2 (two) times daily. With meals   cyanocobalamin 1000 MCG/ML injection Commonly known as: VITAMIN B12 Inject 1,000 mcg into the muscle every 30 (thirty) days.   diazepam 2 MG tablet Commonly known as: VALIUM Take 2 mg by mouth 4 (four) times daily as needed for muscle spasms.   famotidine 40 MG tablet Commonly known as: PEPCID Take 40 mg by mouth at bedtime.   FLUoxetine 20 MG capsule Commonly known as: PROZAC Take 60 mg by mouth daily.   gabapentin 300 MG capsule Commonly known as: NEURONTIN Take 1 capsule by mouth 2 (two) times daily.   hydrochlorothiazide 25 MG tablet Commonly known as: HYDRODIURIL Take 25 mg by mouth daily.   levothyroxine 50 MCG tablet Commonly known as: SYNTHROID Take 50 mcg by  mouth daily before breakfast.   LORazepam 1 MG tablet Commonly known as: ATIVAN Take 1 mg by mouth 2 (two) times daily.   metFORMIN 500 MG 24 hr tablet Commonly known as: GLUCOPHAGE-XR Take 1,500 mg by mouth at bedtime.   methylphenidate 10 MG tablet Commonly known as: RITALIN Take 10 mg by mouth 2 (two) times daily.   multivitamin with minerals tablet Take 1 tablet by mouth daily.   ondansetron 4 MG disintegrating tablet Commonly known as: ZOFRAN-ODT Take 4 mg by mouth every 8 (eight) hours as needed for nausea/vomiting.   pantoprazole 40 MG tablet Commonly known as: PROTONIX Take 1 tablet by mouth 2 (two) times daily.   Phendimetrazine Tartrate 35 MG Tabs Take 105 mg by mouth daily.   predniSONE 10 MG tablet Commonly known as: DELTASONE Take by mouth.   Prolia 60 MG/ML Sosy injection Generic drug: denosumab Inject into the skin.   traMADol 50 MG tablet Commonly known as: ULTRAM Take by mouth every 6 (six) hours as needed.   valsartan-hydrochlorothiazide 320-12.5 MG tablet Commonly known as: DIOVAN-HCT Take 1 tablet by mouth daily.   Victoza 18 MG/3ML Sopn Generic drug: liraglutide Inject into the skin.         Allergies:  Allergies  Allergen Reactions   Penicillins Hives    Did it involve swelling of the face/tongue/throat, SOB, or low BP? No Did it involve sudden or severe rash/hives, skin peeling, or any reaction on the inside of your mouth or nose? Yes Did you need to seek medical attention at a hospital or doctor's office? Unknown When did it last happen?      20 + years If all above answers are "NO", may proceed with cephalosporin use.     Other Hives    succinylsulfathiazole   Ibuprofen Rash   Sulfa Antibiotics Hives, Nausea And Vomiting, Itching and Rash    Family History: Family History  Problem Relation Age of Onset   Breast cancer Paternal Grandmother    Heart disease Mother    Kidney failure Father    Colon cancer Maternal Grandmother     Social History:  reports that she quit smoking about 5 years ago. Her smoking use included cigarettes. She has never used smokeless tobacco. She reports that she does not drink alcohol and does not use drugs.  ROS:                                        Physical Exam: There were no vitals taken for this visit.    Laboratory Data: Lab Results  Component Value Date   WBC 11.6 (H) 03/01/2020   HGB 10.0 (L) 03/01/2020   HCT 31.3 (L) 03/01/2020   MCV 93.2 03/01/2020   PLT 333 03/01/2020    Lab Results  Component Value Date   CREATININE 0.63 02/29/2020    No results found for: "PSA"  No results found for: "TESTOSTERONE"  No results found for: "HGBA1C"  Urinalysis    Component Value Date/Time   COLORURINE RED (A) 02/29/2020 0130   APPEARANCEUR Clear 09/29/2022 1048   LABSPEC 1.025 02/29/2020 0130   PHURINE 6.0 02/29/2020 0130   GLUCOSEU Negative 09/29/2022 1048   HGBUR LARGE (A) 02/29/2020 0130   BILIRUBINUR Negative 09/29/2022 1048   KETONESUR NEGATIVE 02/29/2020 0130   PROTEINUR 1+ (A) 09/29/2022 1048   PROTEINUR 100 (A) 02/29/2020 0130   NITRITE Negative 09/29/2022  1048   NITRITE  NEGATIVE 02/29/2020 0130   LEUKOCYTESUR 1+ (A) 09/29/2022 1048   LEUKOCYTESUR NEGATIVE 02/29/2020 0130    Pertinent Imaging:   Assessment & Plan: I thought it was best to add 5 more days of ciprofloxacin to the 3 days.  She was tender perhaps more than usual.  I will get a CT scan and call if abnormal because she has had microscopic hematuria as noted above.  Follow as per protocol.  Call if culture differs  There are no diagnoses linked to this encounter.  No follow-ups on file.  Reece Packer, MD  [facility] [address] [place], [place] [address] [phone]

## 2022-10-15 LAB — CULTURE, URINE COMPREHENSIVE

## 2022-10-20 ENCOUNTER — Other Ambulatory Visit: Payer: Self-pay | Admitting: *Deleted

## 2022-10-20 MED ORDER — NITROFURANTOIN MONOHYD MACRO 100 MG PO CAPS: 100.0000 mg | ORAL_CAPSULE | Freq: Two times a day (BID) | ORAL | 0 refills | Status: AC

## 2022-10-28 ENCOUNTER — Ambulatory Visit: Payer: Medicare HMO | Admitting: Physician Assistant

## 2022-11-06 ENCOUNTER — Ambulatory Visit: Payer: Medicare HMO

## 2022-11-10 ENCOUNTER — Ambulatory Visit: Payer: Medicare HMO | Admitting: Physician Assistant

## 2022-11-10 ENCOUNTER — Encounter: Payer: Self-pay | Admitting: Physician Assistant

## 2022-11-10 VITALS — BP 167/85 | HR 68 | Ht 63.0 in | Wt 195.0 lb

## 2022-11-10 DIAGNOSIS — N3941 Urge incontinence: Secondary | ICD-10-CM | POA: Diagnosis not present

## 2022-11-10 DIAGNOSIS — R3129 Other microscopic hematuria: Secondary | ICD-10-CM | POA: Diagnosis not present

## 2022-11-10 LAB — URINALYSIS, COMPLETE
Bilirubin, UA: NEGATIVE
Glucose, UA: NEGATIVE
Ketones, UA: NEGATIVE
Leukocytes,UA: NEGATIVE
Nitrite, UA: NEGATIVE
Specific Gravity, UA: 1.025 (ref 1.005–1.030)
Urobilinogen, Ur: 0.2 mg/dL (ref 0.2–1.0)
pH, UA: 7.5 (ref 5.0–7.5)

## 2022-11-10 LAB — MICROSCOPIC EXAMINATION: RBC, Urine: >30 /hpf — AB (ref 0–2)

## 2022-11-10 LAB — BLADDER SCAN AMB NON-IMAGING: Scan Result: 0

## 2022-11-10 NOTE — Patient Instructions (Signed)
Please call [phone] to schedule your CT scan.

## 2022-11-10 NOTE — Progress Notes (Signed)
11/10/2022 11:01 AM   Ashlee Fields [birth year] [id number]  CC: Chief Complaint  Patient presents with   Follow-up    HPI: Ashlee Fields is a 66 y.o. female with PMH MS, OSA, diabetes, OAB wet who underwent intravesical Botox with Dr. Matilde Sprang 4 weeks ago and a recent history of microscopic hematuria awaiting CT urogram who presents today for Botox follow-up.   Today she reports she has had some persistent leakage and hesitancy after her most recent Botox, however overall she feels that her symptoms are well-controlled.  She was able to empty well upon arrival today.  PVR 0 mL.  She reports she was previously scheduled for CT urogram, but rescheduled this due to a labial abscess that was causing her discomfort.  She would like to reschedule it for January.  In-office UA today positive for 3+ blood and trace protein; urine microscopy with >30 RBCs/HPF.  PMH: Past Medical History:  Diagnosis Date   Anxiety    Arthritis    Depression    Diabetes mellitus without complication ([place])    GERD (gastroesophageal reflux disease)    Hypertension    Hypothyroidism    Multiple sclerosis ([place]) 1980   Osteoporosis    Sleep apnea    Vision abnormalities     Surgical History: Past Surgical History:  Procedure Laterality Date   CESAREAN SECTION     COLONOSCOPY     ESOPHAGOGASTRODUODENOSCOPY (EGD) WITH PROPOFOL N/A 11/20/2020   Procedure: ESOPHAGOGASTRODUODENOSCOPY (EGD) WITH PROPOFOL;  Surgeon: [name], MD;  Location: [facility];  Service: Endoscopy;  Laterality: N/A;   JOINT REPLACEMENT     REPLACEMENT TOTAL KNEE Left 2009   RIGHT HEART CATH Right 08/05/2017   Procedure: RIGHT HEART CATH;  Surgeon: Teodoro Spray, MD;  Location: [place] [facility];  Service: Cardiovascular;  Laterality: Right;   TOTAL KNEE ARTHROPLASTY Right 02/28/2020   Procedure: TOTAL KNEE ARTHROPLASTY;  Surgeon: Thornton Park, MD;  Location: [facility];  Service: Orthopedics;  Laterality:  Right;    Home Medications:  Allergies as of 11/10/2022       Reactions   Penicillins Hives   Did it involve swelling of the face/tongue/throat, SOB, or low BP? No Did it involve sudden or severe rash/hives, skin peeling, or any reaction on the inside of your mouth or nose? Yes Did you need to seek medical attention at a hospital or doctor's office? Unknown When did it last happen?      20 + years If all above answers are "NO", may proceed with cephalosporin use.   Other Hives   succinylsulfathiazole   Ibuprofen Rash   Sulfa Antibiotics Hives, Nausea And Vomiting, Itching, Rash        Medication List        Accurate as of November 10, 2022 11:01 AM. If you have any questions, ask your nurse or doctor.          acetaminophen 500 MG tablet Commonly known as: TYLENOL Take 2 tablets (1,000 mg total) by mouth 3 (three) times daily.   ascorbic acid 500 MG tablet Commonly known as: VITAMIN C Take 500 mg by mouth daily.   atorvastatin 80 MG tablet Commonly known as: LIPITOR Take 80 mg by mouth at bedtime.   bisoprolol 10 MG tablet Commonly known as: ZEBETA Take 1 tablet by mouth daily.   buPROPion 150 MG 24 hr tablet Commonly known as: WELLBUTRIN XL Take 150 mg by mouth every morning.   calcium-vitamin D 500-200  MG-UNIT tablet Commonly known as: OSCAL WITH D Take 1 tablet by mouth 2 (two) times daily. With meals   cyanocobalamin 1000 MCG/ML injection Commonly known as: VITAMIN B12 Inject 1,000 mcg into the muscle every 30 (thirty) days.   diazepam 2 MG tablet Commonly known as: VALIUM Take 2 mg by mouth 4 (four) times daily as needed for muscle spasms.   famotidine 40 MG tablet Commonly known as: PEPCID Take 40 mg by mouth at bedtime.   FLUoxetine 20 MG capsule Commonly known as: PROZAC Take 60 mg by mouth daily.   gabapentin 300 MG capsule Commonly known as: NEURONTIN Take 1 capsule by mouth 2 (two) times daily.   hydrochlorothiazide 25 MG  tablet Commonly known as: HYDRODIURIL Take 25 mg by mouth daily.   levothyroxine 50 MCG tablet Commonly known as: SYNTHROID Take 50 mcg by mouth daily before breakfast.   LORazepam 1 MG tablet Commonly known as: ATIVAN Take 1 mg by mouth 2 (two) times daily.   metFORMIN 500 MG 24 hr tablet Commonly known as: GLUCOPHAGE-XR Take 1,500 mg by mouth at bedtime.   methylphenidate 10 MG tablet Commonly known as: RITALIN Take 10 mg by mouth 2 (two) times daily.   multivitamin with minerals tablet Take 1 tablet by mouth daily.   ondansetron 4 MG disintegrating tablet Commonly known as: ZOFRAN-ODT Take 4 mg by mouth every 8 (eight) hours as needed for nausea/vomiting.   pantoprazole 40 MG tablet Commonly known as: PROTONIX Take 1 tablet by mouth 2 (two) times daily.   Phendimetrazine Tartrate 35 MG Tabs Take 105 mg by mouth daily.   predniSONE 10 MG tablet Commonly known as: DELTASONE Take by mouth.   Prolia 60 MG/ML Sosy injection Generic drug: denosumab Inject into the skin.   traMADol 50 MG tablet Commonly known as: ULTRAM Take by mouth every 6 (six) hours as needed.   valsartan-hydrochlorothiazide 320-12.5 MG tablet Commonly known as: DIOVAN-HCT Take 1 tablet by mouth daily.   Victoza 18 MG/3ML Sopn Generic drug: liraglutide Inject into the skin.        Allergies:  Allergies  Allergen Reactions   Penicillins Hives    Did it involve swelling of the face/tongue/throat, SOB, or low BP? No Did it involve sudden or severe rash/hives, skin peeling, or any reaction on the inside of your mouth or nose? Yes Did you need to seek medical attention at a hospital or doctor's office? Unknown When did it last happen?      20 + years If all above answers are "NO", may proceed with cephalosporin use.     Other Hives    succinylsulfathiazole   Ibuprofen Rash   Sulfa Antibiotics Hives, Nausea And Vomiting, Itching and Rash    Family History: Family History  Problem  Relation Age of Onset   Breast cancer Paternal Grandmother    Heart disease Mother    Kidney failure Father    Colon cancer Maternal Grandmother     Social History:   reports that she quit smoking about 5 years ago. Her smoking use included cigarettes. She has been exposed to tobacco smoke. She has never used smokeless tobacco. She reports that she does not drink alcohol and does not use drugs.  Physical Exam: BP (!) 167/85   Pulse 68   Ht _0  (1.6 m)   Wt 195 lb (88.5 kg)   BMI 34.54 kg/m   Constitutional:  Alert and oriented, no acute distress, nontoxic appearing HEENT: [place], AT Cardiovascular: No  clubbing, cyanosis, or edema Respiratory: Normal respiratory effort, no increased work of breathing Skin: No rashes, bruises or suspicious lesions Neurologic: Grossly intact, no focal deficits, moving all 4 extremities Psychiatric: Normal mood and affect  Laboratory Data: Results for orders placed or performed in visit on 11/10/22  Microscopic Examination   Urine  Result Value Ref Range   WBC, UA 0-5 0 - 5 /hpf   RBC, Urine >30 (A) 0 - 2 /hpf   Epithelial Cells (non renal) 0-10 0 - 10 /hpf   Casts Present (A) None seen /lpf   Cast Type Hyaline casts N/A   Bacteria, UA Few None seen/Few  Urinalysis, Complete  Result Value Ref Range   Specific Gravity, UA 1.025 1.005 - 1.030   pH, UA 7.5 5.0 - 7.5   Color, UA Yellow Yellow   Appearance Ur Clear Clear   Leukocytes,UA Negative Negative   Protein,UA Trace (A) Negative/Trace   Glucose, UA Negative Negative   Ketones, UA Negative Negative   RBC, UA 3+ (A) Negative   Bilirubin, UA Negative Negative   Urobilinogen, Ur 0.2 0.2 - 1.0 mg/dL   Nitrite, UA Negative Negative   Microscopic Examination See below:   Bladder Scan (Post Void Residual) in office  Result Value Ref Range   Scan Result 0    Assessment & Plan:   1. Urgency incontinence She is emptying appropriately following recent intravesical Botox with no evidence of  urinary infection today.  She prefers to proactively schedule this treatment for every 6 months, we will have our staff arrange this. - Urinalysis, Complete - Bladder Scan (Post Void Residual) in office  2. Microscopic hematuria Persistent microscopic hematuria today.  I encouraged her to reschedule her CT urogram at her convenience and we will reach out to her with her results when available.  Return for Will call to schedule 6 month Botox with Dr. Matilde Sprang.  Debroah Loop, PA-C  [facility] [address] [place], [place] [address] [phone]

## 2022-11-22 ENCOUNTER — Other Ambulatory Visit: Payer: Self-pay

## 2022-11-22 ENCOUNTER — Emergency Department: Payer: Medicare HMO

## 2022-11-22 ENCOUNTER — Inpatient Hospital Stay
Admission: EM | Admit: 2022-11-22 | Discharge: 2022-11-28 | DRG: 871 | Disposition: A | Discharge status: Home / Self Care | Payer: Medicare HMO | Attending: Student in an Organized Health Care Education/Training Program | Admitting: Student in an Organized Health Care Education/Training Program

## 2022-11-22 DIAGNOSIS — F32A Depression, unspecified: Secondary | ICD-10-CM | POA: Diagnosis present

## 2022-11-22 DIAGNOSIS — R6520 Severe sepsis without septic shock: Secondary | ICD-10-CM | POA: Diagnosis present

## 2022-11-22 DIAGNOSIS — A419 Sepsis, unspecified organism: Secondary | ICD-10-CM | POA: Diagnosis not present

## 2022-11-22 DIAGNOSIS — Z8249 Family history of ischemic heart disease and other diseases of the circulatory system: Secondary | ICD-10-CM

## 2022-11-22 DIAGNOSIS — E785 Hyperlipidemia, unspecified: Secondary | ICD-10-CM | POA: Diagnosis present

## 2022-11-22 DIAGNOSIS — J13 Pneumonia due to Streptococcus pneumoniae: Secondary | ICD-10-CM | POA: Diagnosis present

## 2022-11-22 DIAGNOSIS — I272 Pulmonary hypertension, unspecified: Secondary | ICD-10-CM | POA: Diagnosis present

## 2022-11-22 DIAGNOSIS — Z87891 Personal history of nicotine dependence: Secondary | ICD-10-CM

## 2022-11-22 DIAGNOSIS — J1008 Influenza due to other identified influenza virus with other specified pneumonia: Secondary | ICD-10-CM | POA: Diagnosis present

## 2022-11-22 DIAGNOSIS — Z1152 Encounter for screening for COVID-19: Secondary | ICD-10-CM

## 2022-11-22 DIAGNOSIS — J441 Chronic obstructive pulmonary disease with (acute) exacerbation: Secondary | ICD-10-CM | POA: Diagnosis present

## 2022-11-22 DIAGNOSIS — J101 Influenza due to other identified influenza virus with other respiratory manifestations: Secondary | ICD-10-CM | POA: Diagnosis present

## 2022-11-22 DIAGNOSIS — I10 Essential (primary) hypertension: Secondary | ICD-10-CM | POA: Diagnosis present

## 2022-11-22 DIAGNOSIS — E119 Type 2 diabetes mellitus without complications: Secondary | ICD-10-CM

## 2022-11-22 DIAGNOSIS — G8194 Hemiplegia, unspecified affecting left nondominant side: Secondary | ICD-10-CM | POA: Diagnosis present

## 2022-11-22 DIAGNOSIS — E860 Dehydration: Secondary | ICD-10-CM | POA: Diagnosis present

## 2022-11-22 DIAGNOSIS — K219 Gastro-esophageal reflux disease without esophagitis: Secondary | ICD-10-CM | POA: Diagnosis present

## 2022-11-22 DIAGNOSIS — F418 Other specified anxiety disorders: Secondary | ICD-10-CM | POA: Diagnosis present

## 2022-11-22 DIAGNOSIS — D649 Anemia, unspecified: Secondary | ICD-10-CM | POA: Diagnosis present

## 2022-11-22 DIAGNOSIS — D703 Neutropenia due to infection: Secondary | ICD-10-CM

## 2022-11-22 DIAGNOSIS — J440 Chronic obstructive pulmonary disease with acute lower respiratory infection: Secondary | ICD-10-CM | POA: Diagnosis present

## 2022-11-22 DIAGNOSIS — A4189 Other specified sepsis: Principal | ICD-10-CM | POA: Diagnosis present

## 2022-11-22 DIAGNOSIS — J9601 Acute respiratory failure with hypoxia: Secondary | ICD-10-CM | POA: Diagnosis not present

## 2022-11-22 DIAGNOSIS — J9621 Acute and chronic respiratory failure with hypoxia: Secondary | ICD-10-CM | POA: Diagnosis not present

## 2022-11-22 DIAGNOSIS — D709 Neutropenia, unspecified: Secondary | ICD-10-CM | POA: Diagnosis not present

## 2022-11-22 DIAGNOSIS — Z79890 Hormone replacement therapy (postmenopausal): Secondary | ICD-10-CM

## 2022-11-22 DIAGNOSIS — R7881 Bacteremia: Secondary | ICD-10-CM | POA: Diagnosis present

## 2022-11-22 DIAGNOSIS — G35 Multiple sclerosis: Secondary | ICD-10-CM | POA: Diagnosis present

## 2022-11-22 DIAGNOSIS — N179 Acute kidney failure, unspecified: Secondary | ICD-10-CM | POA: Diagnosis not present

## 2022-11-22 DIAGNOSIS — Z7952 Long term (current) use of systemic steroids: Secondary | ICD-10-CM

## 2022-11-22 DIAGNOSIS — Z6833 Body mass index (BMI) 33.0-33.9, adult: Secondary | ICD-10-CM

## 2022-11-22 DIAGNOSIS — B953 Streptococcus pneumoniae as the cause of diseases classified elsewhere: Secondary | ICD-10-CM | POA: Diagnosis not present

## 2022-11-22 DIAGNOSIS — Z880 Allergy status to penicillin: Secondary | ICD-10-CM

## 2022-11-22 DIAGNOSIS — E039 Hypothyroidism, unspecified: Secondary | ICD-10-CM | POA: Diagnosis present

## 2022-11-22 DIAGNOSIS — E876 Hypokalemia: Secondary | ICD-10-CM | POA: Insufficient documentation

## 2022-11-22 DIAGNOSIS — G4733 Obstructive sleep apnea (adult) (pediatric): Secondary | ICD-10-CM | POA: Diagnosis present

## 2022-11-22 DIAGNOSIS — Z96653 Presence of artificial knee joint, bilateral: Secondary | ICD-10-CM | POA: Diagnosis present

## 2022-11-22 DIAGNOSIS — G35D Multiple sclerosis, unspecified: Secondary | ICD-10-CM | POA: Diagnosis present

## 2022-11-22 DIAGNOSIS — Z7984 Long term (current) use of oral hypoglycemic drugs: Secondary | ICD-10-CM

## 2022-11-22 DIAGNOSIS — F419 Anxiety disorder, unspecified: Secondary | ICD-10-CM | POA: Diagnosis present

## 2022-11-22 DIAGNOSIS — E669 Obesity, unspecified: Secondary | ICD-10-CM | POA: Diagnosis present

## 2022-11-22 DIAGNOSIS — Z79899 Other long term (current) drug therapy: Secondary | ICD-10-CM

## 2022-11-22 DIAGNOSIS — Z882 Allergy status to sulfonamides status: Secondary | ICD-10-CM

## 2022-11-22 HISTORY — DX: Neutropenia, unspecified: D70.9

## 2022-11-22 LAB — CBC WITH DIFFERENTIAL/PLATELET
Abs Immature Granulocytes: 0 10*3/uL (ref 0.00–0.07)
Band Neutrophils: 15 %
Basophils Absolute: 0 10*3/uL (ref 0.0–0.1)
Basophils Relative: 1 %
Eosinophils Absolute: 0 10*3/uL (ref 0.0–0.5)
Eosinophils Relative: 0 %
HCT: 43.3 % (ref 36.0–46.0)
Hemoglobin: 13.6 g/dL (ref 12.0–15.0)
Lymphocytes Relative: 61 %
Lymphs Abs: 0.5 10*3/uL — ABNORMAL LOW (ref 0.7–4.0)
MCH: 29.5 pg (ref 26.0–34.0)
MCHC: 31.4 g/dL (ref 30.0–36.0)
MCV: 93.9 fL (ref 80.0–100.0)
Metamyelocytes Relative: 2 %
Monocytes Absolute: 0 10*3/uL — ABNORMAL LOW (ref 0.1–1.0)
Monocytes Relative: 6 %
Myelocytes: 2 %
Neutro Abs: 0.2 10*3/uL — CL (ref 1.7–7.7)
Neutrophils Relative %: 13 %
Platelets: 288 10*3/uL (ref 150–400)
RBC: 4.61 MIL/uL (ref 3.87–5.11)
RDW: 15.1 % (ref 11.5–15.5)
WBC: 0.8 10*3/uL — CL (ref 4.0–10.5)
nRBC: 0 % (ref 0.0–0.2)

## 2022-11-22 LAB — RESP PANEL BY RT-PCR (RSV, FLU A&B, COVID)  RVPGX2
Influenza A by PCR: POSITIVE — AB
Influenza B by PCR: NEGATIVE
Resp Syncytial Virus by PCR: NEGATIVE
SARS Coronavirus 2 by RT PCR: NEGATIVE

## 2022-11-22 LAB — URINALYSIS, COMPLETE (UACMP) WITH MICROSCOPIC
Bilirubin Urine: NEGATIVE
Glucose, UA: NEGATIVE mg/dL
Ketones, ur: NEGATIVE mg/dL
Leukocytes,Ua: NEGATIVE
Nitrite: NEGATIVE
Protein, ur: 100 mg/dL — AB
RBC / HPF: >50 RBC/hpf — ABNORMAL HIGH (ref 0–5)
Specific Gravity, Urine: 1.016 (ref 1.005–1.030)
pH: 5 (ref 5.0–8.0)

## 2022-11-22 LAB — COMPREHENSIVE METABOLIC PANEL
ALT: 43 U/L (ref 0–44)
AST: 62 U/L — ABNORMAL HIGH (ref 15–41)
Albumin: 3.3 g/dL — ABNORMAL LOW (ref 3.5–5.0)
Alkaline Phosphatase: 68 U/L (ref 38–126)
Anion gap: 20 — ABNORMAL HIGH (ref 5–15)
BUN: 31 mg/dL — ABNORMAL HIGH (ref 8–23)
CO2: 20 mmol/L — ABNORMAL LOW (ref 22–32)
Calcium: 8.8 mg/dL — ABNORMAL LOW (ref 8.9–10.3)
Chloride: 100 mmol/L (ref 98–111)
Creatinine, Ser: 1.41 mg/dL — ABNORMAL HIGH (ref 0.44–1.00)
GFR, Estimated: 41 mL/min — ABNORMAL LOW (ref >60)
Glucose, Bld: 149 mg/dL — ABNORMAL HIGH (ref 70–99)
Potassium: 4.5 mmol/L (ref 3.5–5.1)
Sodium: 140 mmol/L (ref 135–145)
Total Bilirubin: 0.5 mg/dL (ref 0.3–1.2)
Total Protein: 6.9 g/dL (ref 6.5–8.1)

## 2022-11-22 LAB — BRAIN NATRIURETIC PEPTIDE: B Natriuretic Peptide: 426.4 pg/mL — ABNORMAL HIGH (ref 0.0–100.0)

## 2022-11-22 LAB — TROPONIN I (HIGH SENSITIVITY)
Troponin I (High Sensitivity): 10 ng/L (ref <18)
Troponin I (High Sensitivity): 12 ng/L (ref <18)

## 2022-11-22 LAB — STREP PNEUMONIAE URINARY ANTIGEN: Strep Pneumo Urinary Antigen: POSITIVE — AB

## 2022-11-22 LAB — CBG MONITORING, ED
Glucose-Capillary: 104 mg/dL — ABNORMAL HIGH (ref 70–99)
Glucose-Capillary: 153 mg/dL — ABNORMAL HIGH (ref 70–99)

## 2022-11-22 LAB — LACTIC ACID, PLASMA
Lactic Acid, Venous: 5.4 mmol/L (ref 0.5–1.9)
Lactic Acid, Venous: 3.7 mmol/L (ref 0.5–1.9)
Lactic Acid, Venous: 4.5 mmol/L (ref 0.5–1.9)

## 2022-11-22 LAB — PATHOLOGIST SMEAR REVIEW

## 2022-11-22 LAB — HIV ANTIBODY (ROUTINE TESTING W REFLEX): HIV Screen 4th Generation wRfx: NONREACTIVE

## 2022-11-22 LAB — PROCALCITONIN: Procalcitonin: 46.62 ng/mL

## 2022-11-22 MED ORDER — LORAZEPAM 1 MG PO TABS
1.0000 mg | ORAL_TABLET | Freq: Two times a day (BID) | ORAL | Status: DC
  Administered 2022-11-23 – 2022-11-27 (×10): 1 mg via ORAL
  Filled 2022-11-22 (×10): qty 1

## 2022-11-22 MED ORDER — SODIUM CHLORIDE 0.9 % IV SOLN: INTRAVENOUS | Status: DC

## 2022-11-22 MED ORDER — ENOXAPARIN SODIUM 40 MG/0.4ML IJ SOSY
40.0000 mg | PREFILLED_SYRINGE | INTRAMUSCULAR | Status: DC
  Administered 2022-11-22 – 2022-11-27 (×6): 40 mg via SUBCUTANEOUS
  Filled 2022-11-22 (×6): qty 0.4

## 2022-11-22 MED ORDER — BISOPROLOL FUMARATE 5 MG PO TABS
10.0000 mg | ORAL_TABLET | Freq: Every day | ORAL | Status: DC
  Administered 2022-11-23 – 2022-11-28 (×6): 10 mg via ORAL
  Filled 2022-11-22 (×7): qty 2

## 2022-11-22 MED ORDER — INSULIN ASPART 100 UNIT/ML IJ SOLN
0.0000 [IU] | Freq: Three times a day (TID) | INTRAMUSCULAR | Status: DC
  Administered 2022-11-23: 2 [IU] via SUBCUTANEOUS
  Administered 2022-11-23: 1 [IU] via SUBCUTANEOUS
  Administered 2022-11-23: 2 [IU] via SUBCUTANEOUS
  Administered 2022-11-24: 1 [IU] via SUBCUTANEOUS
  Administered 2022-11-25: 2 [IU] via SUBCUTANEOUS
  Administered 2022-11-25: 3 [IU] via SUBCUTANEOUS
  Administered 2022-11-26: 2 [IU] via SUBCUTANEOUS
  Administered 2022-11-26 – 2022-11-27 (×3): 3 [IU] via SUBCUTANEOUS
  Administered 2022-11-27 – 2022-11-28 (×2): 2 [IU] via SUBCUTANEOUS
  Filled 2022-11-22 (×12): qty 1

## 2022-11-22 MED ORDER — DM-GUAIFENESIN ER 30-600 MG PO TB12: 1.0000 | ORAL_TABLET | Freq: Two times a day (BID) | ORAL | Status: DC | PRN

## 2022-11-22 MED ORDER — LACTATED RINGERS IV BOLUS (SEPSIS): 1000.0000 mL | Freq: Once | INTRAVENOUS | Status: DC

## 2022-11-22 MED ORDER — SODIUM CHLORIDE 0.9 % IV SOLN
1.0000 g | Freq: Once | INTRAVENOUS | Status: AC
  Administered 2022-11-22: 1 g via INTRAVENOUS
  Filled 2022-11-22: qty 10

## 2022-11-22 MED ORDER — GABAPENTIN 300 MG PO CAPS: 300.0000 mg | ORAL_CAPSULE | Freq: Two times a day (BID) | ORAL | Status: DC

## 2022-11-22 MED ORDER — OSELTAMIVIR PHOSPHATE 30 MG PO CAPS
30.0000 mg | ORAL_CAPSULE | Freq: Two times a day (BID) | ORAL | Status: DC
  Administered 2022-11-22: 30 mg via ORAL
  Filled 2022-11-22 (×2): qty 1

## 2022-11-22 MED ORDER — ACETAMINOPHEN 325 MG PO TABS
650.0000 mg | ORAL_TABLET | Freq: Four times a day (QID) | ORAL | Status: DC | PRN
  Administered 2022-11-22 – 2022-11-28 (×4): 650 mg via ORAL
  Filled 2022-11-22 (×4): qty 2

## 2022-11-22 MED ORDER — DIAZEPAM 2 MG PO TABS
2.0000 mg | ORAL_TABLET | Freq: Four times a day (QID) | ORAL | Status: DC | PRN
  Administered 2022-11-23: 2 mg via ORAL
  Filled 2022-11-22: qty 1

## 2022-11-22 MED ORDER — FLUOXETINE HCL 20 MG PO CAPS
60.0000 mg | ORAL_CAPSULE | Freq: Every day | ORAL | Status: DC
  Administered 2022-11-23 – 2022-11-28 (×6): 60 mg via ORAL
  Filled 2022-11-22 (×6): qty 3

## 2022-11-22 MED ORDER — METHYLPREDNISOLONE SODIUM SUCC 40 MG IJ SOLR: 40.0000 mg | Freq: Two times a day (BID) | INTRAMUSCULAR | Status: DC

## 2022-11-22 MED ORDER — HYDRALAZINE HCL 20 MG/ML IJ SOLN
5.0000 mg | INTRAMUSCULAR | Status: DC | PRN
  Administered 2022-11-26: 5 mg via INTRAVENOUS
  Filled 2022-11-22: qty 1

## 2022-11-22 MED ORDER — ATORVASTATIN CALCIUM 80 MG PO TABS
80.0000 mg | ORAL_TABLET | Freq: Every day | ORAL | Status: DC
  Administered 2022-11-23 – 2022-11-27 (×5): 80 mg via ORAL
  Filled 2022-11-22: qty 4
  Filled 2022-11-22 (×3): qty 1
  Filled 2022-11-22: qty 4

## 2022-11-22 MED ORDER — SODIUM CHLORIDE 0.9 % IV SOLN: 500.0000 mg | INTRAVENOUS | Status: DC

## 2022-11-22 MED ORDER — IPRATROPIUM-ALBUTEROL 0.5-2.5 (3) MG/3ML IN SOLN
3.0000 mL | RESPIRATORY_TRACT | Status: DC
  Administered 2022-11-22 – 2022-11-24 (×13): 3 mL via RESPIRATORY_TRACT
  Filled 2022-11-22 (×13): qty 3

## 2022-11-22 MED ORDER — METHYLPREDNISOLONE SODIUM SUCC 125 MG IJ SOLR
80.0000 mg | Freq: Two times a day (BID) | INTRAMUSCULAR | Status: DC
  Administered 2022-11-22 – 2022-11-24 (×4): 80 mg via INTRAVENOUS
  Filled 2022-11-22 (×4): qty 2

## 2022-11-22 MED ORDER — OSELTAMIVIR PHOSPHATE 75 MG PO CAPS
75.0000 mg | ORAL_CAPSULE | Freq: Once | ORAL | Status: AC
  Administered 2022-11-22: 75 mg via ORAL
  Filled 2022-11-22: qty 1

## 2022-11-22 MED ORDER — SODIUM CHLORIDE 0.9 % IV SOLN
500.0000 mg | Freq: Once | INTRAVENOUS | Status: AC
  Administered 2022-11-22: 500 mg via INTRAVENOUS
  Filled 2022-11-22: qty 5

## 2022-11-22 MED ORDER — METHYLPREDNISOLONE SODIUM SUCC 125 MG IJ SOLR
125.0000 mg | Freq: Once | INTRAMUSCULAR | Status: AC
  Administered 2022-11-22: 125 mg via INTRAVENOUS
  Filled 2022-11-22: qty 2

## 2022-11-22 MED ORDER — BUPROPION HCL ER (XL) 150 MG PO TB24: 150.0000 mg | ORAL_TABLET | Freq: Every morning | ORAL | Status: DC

## 2022-11-22 MED ORDER — SODIUM CHLORIDE 0.9 % IV SOLN: 1.0000 g | INTRAVENOUS | Status: DC

## 2022-11-22 MED ORDER — METHYLPHENIDATE HCL 10 MG PO TABS
10.0000 mg | ORAL_TABLET | Freq: Two times a day (BID) | ORAL | Status: DC
  Administered 2022-11-23 – 2022-11-28 (×11): 10 mg via ORAL
  Filled 2022-11-22 (×12): qty 1

## 2022-11-22 MED ORDER — TRAMADOL HCL 50 MG PO TABS
50.0000 mg | ORAL_TABLET | Freq: Four times a day (QID) | ORAL | Status: DC | PRN
  Filled 2022-11-22: qty 1

## 2022-11-22 MED ORDER — LACTATED RINGERS IV BOLUS (SEPSIS)
1000.0000 mL | Freq: Once | INTRAVENOUS | Status: AC
  Administered 2022-11-22: 1000 mL via INTRAVENOUS

## 2022-11-22 MED ORDER — OSELTAMIVIR PHOSPHATE 30 MG PO CAPS: 30.0000 mg | ORAL_CAPSULE | Freq: Two times a day (BID) | ORAL | Status: DC

## 2022-11-22 MED ORDER — PANTOPRAZOLE SODIUM 40 MG PO TBEC
40.0000 mg | DELAYED_RELEASE_TABLET | Freq: Two times a day (BID) | ORAL | Status: DC
  Administered 2022-11-23 – 2022-11-28 (×11): 40 mg via ORAL
  Filled 2022-11-22 (×11): qty 1

## 2022-11-22 MED ORDER — LEVOTHYROXINE SODIUM 50 MCG PO TABS
50.0000 ug | ORAL_TABLET | Freq: Every day | ORAL | Status: DC
  Administered 2022-11-23 – 2022-11-28 (×6): 50 ug via ORAL
  Filled 2022-11-22 (×6): qty 1

## 2022-11-22 MED ORDER — INSULIN ASPART 100 UNIT/ML IJ SOLN: 0.0000 [IU] | Freq: Every day | INTRAMUSCULAR | Status: DC

## 2022-11-22 MED ORDER — ONDANSETRON HCL 4 MG/2ML IJ SOLN: 4.0000 mg | Freq: Three times a day (TID) | INTRAMUSCULAR | Status: DC | PRN

## 2022-11-22 MED ORDER — ALBUTEROL SULFATE (2.5 MG/3ML) 0.083% IN NEBU
2.5000 mg | INHALATION_SOLUTION | RESPIRATORY_TRACT | Status: DC | PRN
  Administered 2022-11-25 – 2022-11-26 (×2): 2.5 mg via RESPIRATORY_TRACT
  Filled 2022-11-22 (×2): qty 3

## 2022-11-22 NOTE — ED Notes (Signed)
Assorted pill box removed from room and delivered to pharmacy by Lonn [place]. Bag #[id number]. Receipt placed in patients chart.

## 2022-11-22 NOTE — H&P (Signed)
History and Physical    Ashlee Fields XMI:[id number] DOB: [birth year] DOA: 11/22/2022  Referring MD/NP/PA:   PCP: [name], MD   Patient coming from:  The patient is coming from home.   Chief Complaint: SOB  HPI: Ashlee Fields is a 66 y.o. female with medical history significant of hypertension, hyperlipidemia, diabetes mellitus, COPD, GERD, hypothyroidism, depression with anxiety, OSA on CPAP, multiple sclerosis with left-sided weakness, pulmonary hypertension, who presents with SOB.   Patient has been sick about 2 days.  She has cough and shortness breath. No fever, chills and chest pain.  She coughs up little yellow-colored sputum.  Patient does not have nausea vomiting, diarrhea or abdominal pain.  No symptoms of UTI.   Patient was found to have severe respiratory distress, oxygen desaturation to 83% on room air, cannot speak in full sentence, initially started 6 L oxygen, still has respiratory distress, BiPAP is started in ED.  Data reviewed independently and ED Course: pt was found to have positive flu A PCR, lactic acid 4.5, 3.7, procalcitonin 46.62, BNP 426, troponin level 10, neutropenia with WBC 0.8 (neutrophils 13%, lymphocyte 61%).  She had WBC 11.9 on 09/15/2022.  AKI with creatinine 1.41, BUN 31, GFR 41 (baseline creatinine 0.7 on 09/15/22).  Temperature normal, blood pressure 167/101, heart rate 96, RR 32.  Chest x-ray showed possible right lower lobe and left lower lobe infiltration.  Patient is admitted to PCU as inpatient.  Dr. Tasia Catchings of hematology is consulted.  CXR: New right lower greater than mid lung and possible minimal left lower lung heterogeneous airspace opacification. This could be due to pneumonia or asymmetric pulmonary edema.   EKG: I have personally reviewed.  Sinus rhythm, QTc 441, LAE, LAD.   Review of Systems:   General: no fevers, chills, no body weight gain, has fatigue HEENT: no blurry vision, hearing changes or sore  throat Respiratory: has dyspnea, coughing, no wheezing CV: no chest pain, no palpitations GI: no nausea, vomiting, abdominal pain, diarrhea, constipation GU: no dysuria, burning on urination, increased urinary frequency, hematuria  Ext: no leg edema Neuro:  no vision change or hearing loss.  Has chronic left-sided weakness Skin: no rash, no skin tear. MSK: No muscle spasm, no deformity, no limitation of range of movement in spin Heme: No easy bruising.  Travel history: No recent long distant travel.   Allergy:  Allergies  Allergen Reactions   Penicillins Hives    Did it involve swelling of the face/tongue/throat, SOB, or low BP? No Did it involve sudden or severe rash/hives, skin peeling, or any reaction on the inside of your mouth or nose? Yes Did you need to seek medical attention at a hospital or doctor's office? Unknown When did it last happen?      20 + years If all above answers are "NO", may proceed with cephalosporin use.     Other Hives    succinylsulfathiazole   Ibuprofen Rash   Sulfa Antibiotics Hives, Nausea And Vomiting, Itching and Rash    Past Medical History:  Diagnosis Date   Anxiety    Arthritis    Depression    Diabetes mellitus without complication (HCC)    GERD (gastroesophageal reflux disease)    Hypertension    Hypothyroidism    Multiple sclerosis ([place]) 1980   Osteoporosis    Sleep apnea    Vision abnormalities     Past Surgical History:  Procedure Laterality Date   CESAREAN SECTION     COLONOSCOPY  ESOPHAGOGASTRODUODENOSCOPY (EGD) WITH PROPOFOL N/A 11/20/2020   Procedure: ESOPHAGOGASTRODUODENOSCOPY (EGD) WITH PROPOFOL;  Surgeon: [name], MD;  Location: [facility];  Service: Endoscopy;  Laterality: N/A;   JOINT REPLACEMENT     REPLACEMENT TOTAL KNEE Left 2009   RIGHT HEART CATH Right 08/05/2017   Procedure: RIGHT HEART CATH;  Surgeon: Teodoro Spray, MD;  Location: [facility];  Service: Cardiovascular;   Laterality: Right;   TOTAL KNEE ARTHROPLASTY Right 02/28/2020   Procedure: TOTAL KNEE ARTHROPLASTY;  Surgeon: Thornton Park, MD;  Location: [facility];  Service: Orthopedics;  Laterality: Right;    Social History:  reports that she quit smoking about 5 years ago. Her smoking use included cigarettes. She has been exposed to tobacco smoke. She has never used smokeless tobacco. She reports that she does not drink alcohol and does not use drugs.  Family History:  Family History  Problem Relation Age of Onset   Breast cancer Paternal Grandmother    Heart disease Mother    Kidney failure Father    Colon cancer Maternal Grandmother      Prior to Admission medications   Medication Sig Start Date End Date Taking? Authorizing Provider  acetaminophen (TYLENOL) 500 MG tablet Take 2 tablets (1,000 mg total) by mouth 3 (three) times daily. 03/09/20   Thornton Park, MD  ascorbic acid (VITAMIN C) 500 MG tablet Take 500 mg by mouth daily.    [provider]  atorvastatin (LIPITOR) 80 MG tablet Take 80 mg by mouth at bedtime.    [provider]  bisoprolol (ZEBETA) 10 MG tablet Take 1 tablet by mouth daily. 10/06/22 10/06/23  [provider]  buPROPion (WELLBUTRIN XL) 150 MG 24 hr tablet Take 150 mg by mouth every morning. 10/12/22   [provider]  calcium-vitamin D (OSCAL WITH D) 500-200 MG-UNIT tablet Take 1 tablet by mouth 2 (two) times daily. With meals    [provider]  cyanocobalamin (,VITAMIN B-12,) 1000 MCG/ML injection Inject 1,000 mcg into the muscle every 30 (thirty) days.  01/25/16   [provider]  diazepam (VALIUM) 2 MG tablet Take 2 mg by mouth 4 (four) times daily as needed for muscle spasms.     [provider]  famotidine (PEPCID) 40 MG tablet Take 40 mg by mouth at bedtime. 07/15/21   [provider]  FLUoxetine (PROZAC) 20 MG capsule Take 60 mg by mouth daily.    [provider]  gabapentin (NEURONTIN)  300 MG capsule Take 1 capsule by mouth 2 (two) times daily. 02/06/21   [provider]  hydrochlorothiazide (HYDRODIURIL) 25 MG tablet Take 25 mg by mouth daily.    [provider]  levothyroxine (SYNTHROID, LEVOTHROID) 50 MCG tablet Take 50 mcg by mouth daily before breakfast.    [provider]  LORazepam (ATIVAN) 1 MG tablet Take 1 mg by mouth 2 (two) times daily.  11/30/15   [provider]  metFORMIN (GLUCOPHAGE-XR) 500 MG 24 hr tablet Take 1,500 mg by mouth at bedtime.  07/26/17   [provider]  methylphenidate (RITALIN) 10 MG tablet Take 10 mg by mouth 2 (two) times daily.    [provider]  Multiple Vitamins-Minerals (MULTIVITAMIN WITH MINERALS) tablet Take 1 tablet by mouth daily.    [provider]  ondansetron (ZOFRAN-ODT) 4 MG disintegrating tablet Take 4 mg by mouth every 8 (eight) hours as needed for nausea/vomiting. 02/15/20   [provider]  pantoprazole (PROTONIX) 40 MG tablet Take  1 tablet by mouth 2 (two) times daily. 07/29/21   [provider]  Phendimetrazine Tartrate 35 MG TABS Take 105 mg by mouth daily.    [provider]  predniSONE (DELTASONE) 10 MG tablet Take by mouth. 09/17/21   [provider]  PROLIA 60 MG/ML SOSY injection Inject into the skin. 05/07/21   [provider]  traMADol (ULTRAM) 50 MG tablet Take by mouth every 6 (six) hours as needed.    [provider]  valsartan-hydrochlorothiazide (DIOVAN-HCT) 320-12.5 MG tablet Take 1 tablet by mouth daily.    [provider]  VICTOZA 18 MG/3ML SOPN Inject into the skin. 10/12/22   [provider]    Physical Exam: Vitals:   11/22/22 1137 11/22/22 1149 11/22/22 1200 11/22/22 1411  BP: (!) 152/115  (!) 167/101 (!) 147/131  Pulse:  94 93 88  Resp:  (!) 32 (!) 31 (!) 27  Temp: 97.8 F (36.6 C)   98 F (36.7 C)  TempSrc: Oral     SpO2:  95% 95% 93%  Weight:      Height:        General: In acute respiratory distress HEENT:       Eyes: PERRL, EOMI, no scleral icterus.       ENT: No discharge from the ears and nose, no pharynx injection, no tonsillar enlargement.        Neck: No JVD, no bruit, no mass felt. Heme: No neck lymph node enlargement. Cardiac: S1/S2, RRR, No murmurs, No gallops or rubs. Respiratory: Has diffused coarse breathing sound, crackles, rhonchi bilaterally GI: Soft, nondistended, nontender, no rebound pain, no organomegaly, BS present. GU: No hematuria Ext: No pitting leg edema bilaterally. 1+DP/PT pulse bilaterally. Musculoskeletal: No joint deformities, No joint redness or warmth, no limitation of ROM in spin. Skin: No rashes.  Neuro: Alert, oriented X3, cranial nerves II-XII grossly intact,  Has chronic left-sided weakness Psych: Patient is not psychotic, no suicidal or hemocidal ideation.  Labs on Admission: I have personally reviewed following labs and imaging studies  CBC: Recent Labs  Lab 11/22/22 1129  WBC 0.8*  NEUTROABS 0.2*  HGB 13.6  HCT 43.3  MCV 93.9  PLT 939   Basic Metabolic Panel: Recent Labs  Lab 11/22/22 1129  NA 140  K 4.5  CL 100  CO2 20*  GLUCOSE 149*  BUN 31*  CREATININE 1.41*  CALCIUM 8.8*   GFR: Estimated Creatinine Clearance: 39.7 mL/min (A) (by C-G formula based on SCr of 1.41 mg/dL (H)). Liver Function Tests: Recent Labs  Lab 11/22/22 1129  AST 62*  ALT 43  ALKPHOS 68  BILITOT 0.5  PROT 6.9  ALBUMIN 3.3*   No results for input(s): "LIPASE", "AMYLASE" in the last 168 hours. No results for input(s): "AMMONIA" in the last 168 hours. Coagulation Profile: No results for input(s): "INR", "PROTIME" in the last 168 hours. Cardiac Enzymes: No results for input(s): "CKTOTAL", "CKMB", "CKMBINDEX", "TROPONINI" in the last 168 hours. BNP (last 3 results) No results for input(s): "PROBNP" in the last 8760 hours. HbA1C: No results for input(s): "HGBA1C" in the last 72 hours. CBG: No results  for input(s): "GLUCAP" in the last 168 hours. Lipid Profile: No results for input(s): "CHOL", "HDL", "LDLCALC", "TRIG", "CHOLHDL", "LDLDIRECT" in the last 72 hours. Thyroid Function Tests: No results for input(s): "TSH", "T4TOTAL", "FREET4", "T3FREE", "THYROIDAB" in the last 72 hours. Anemia Panel: No results for input(s): "VITAMINB12", "FOLATE", "FERRITIN", "TIBC", "IRON", "RETICCTPCT" in the last 72 hours. Urine analysis:  Component Value Date/Time   COLORURINE RED (A) 02/29/2020 0130   APPEARANCEUR Clear 11/10/2022 1021   LABSPEC 1.025 02/29/2020 0130   PHURINE 6.0 02/29/2020 0130   GLUCOSEU Negative 11/10/2022 1021   HGBUR LARGE (A) 02/29/2020 0130   BILIRUBINUR Negative 11/10/2022 1021   KETONESUR NEGATIVE 02/29/2020 0130   PROTEINUR Trace (A) 11/10/2022 1021   PROTEINUR 100 (A) 02/29/2020 0130   NITRITE Negative 11/10/2022 1021   NITRITE NEGATIVE 02/29/2020 0130   LEUKOCYTESUR Negative 11/10/2022 1021   LEUKOCYTESUR NEGATIVE 02/29/2020 0130   Sepsis Labs: _0 (procalcitonin:4,lacticidven:4) ) Recent Results (from the past 240 hour(s))  Resp panel by RT-PCR (RSV, Flu A&B, Covid) Anterior Nasal Swab     Status: Abnormal   Collection Time: 11/22/22 11:29 AM   Specimen: Anterior Nasal Swab  Result Value Ref Range Status   SARS Coronavirus 2 by RT PCR NEGATIVE NEGATIVE Final    Comment: (NOTE) SARS-CoV-2 target nucleic acids are NOT DETECTED.  The SARS-CoV-2 RNA is generally detectable in upper respiratory specimens during the acute phase of infection. The lowest concentration of SARS-CoV-2 viral copies this assay can detect is 138 copies/mL. A negative result does not preclude SARS-Cov-2 infection and should not be used as the sole basis for treatment or other patient management decisions. A negative result may occur with  improper specimen collection/handling, submission of specimen other than nasopharyngeal swab, presence of viral mutation(s) within the areas  targeted by this assay, and inadequate number of viral copies(<138 copies/mL). A negative result must be combined with clinical observations, patient history, and epidemiological information. The expected result is Negative.  Fact Sheet for Patients:  EntrepreneurPulse.com.au  Fact Sheet for Healthcare Providers:  IncredibleEmployment.be  This test is no t yet approved or cleared by the [place] FDA and  has been authorized for detection and/or diagnosis of SARS-CoV-2 by FDA under an Emergency Use Authorization (EUA). This EUA will remain  in effect (meaning this test can be used) for the duration of the COVID-19 declaration under Section 564(b)(1) of the Act, 21 U.S.C.section 360bbb-3(b)(1), unless the authorization is terminated  or revoked sooner.       Influenza A by PCR POSITIVE (A) NEGATIVE Final   Influenza B by PCR NEGATIVE NEGATIVE Final    Comment: (NOTE) The Xpert Xpress SARS-CoV-2/FLU/RSV plus assay is intended as an aid in the diagnosis of influenza from Nasopharyngeal swab specimens and should not be used as a sole basis for treatment. Nasal washings and aspirates are unacceptable for Xpert Xpress SARS-CoV-2/FLU/RSV testing.  Fact Sheet for Patients: EntrepreneurPulse.com.au  Fact Sheet for Healthcare Providers: IncredibleEmployment.be  This test is not yet approved or cleared by the [place] FDA and has been authorized for detection and/or diagnosis of SARS-CoV-2 by FDA under an Emergency Use Authorization (EUA). This EUA will remain in effect (meaning this test can be used) for the duration of the COVID-19 declaration under Section 564(b)(1) of the Act, 21 U.S.C. section 360bbb-3(b)(1), unless the authorization is terminated or revoked.     Resp Syncytial Virus by PCR NEGATIVE NEGATIVE Final    Comment: (NOTE) Fact Sheet for  Patients: EntrepreneurPulse.com.au  Fact Sheet for Healthcare Providers: IncredibleEmployment.be  This test is not yet approved or cleared by the [place] FDA and has been authorized for detection and/or diagnosis of SARS-CoV-2 by FDA under an Emergency Use Authorization (EUA). This EUA will remain in effect (meaning this test can be used) for the duration of the COVID-19 declaration under Section 564(b)(1) of the Act, 21  U.S.C. section 360bbb-3(b)(1), unless the authorization is terminated or revoked.  Performed at [facility], [address], [place], [place] [address]      Radiological Exams on Admission: DG Chest [facility] 1 View  Result Date: 11/22/2022 CLINICAL DATA:  Shortness of breath. EXAM: PORTABLE CHEST 1 VIEW COMPARISON:  AP chest 02/29/2020, chest and left rib radiographs 07/13/2016; CT chest 01/22/2017 FINDINGS: Cardiac silhouette is again mildly enlarged. Mediastinal contours are within normal limits. There is new right lower greater than mid lung and possible minimal left lower lung heterogeneous airspace opacification. No definite pleural effusion. No pneumothorax. 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 IMPRESSION: New right lower greater than mid lung and possible minimal left lower lung heterogeneous airspace opacification. This could be due to pneumonia or asymmetric pulmonary edema. Electronically Signed   By: Yvonne Kendall M.D.   On: 11/22/2022 11:51      Assessment/Plan Principal Problem:   Influenza A Active Problems:   Acute on chronic respiratory failure with hypoxia (HCC)   Severe sepsis (HCC)   COPD exacerbation (HCC)   AKI (acute kidney injury) (HCC)   Neutropenia (HCC)   Diabetes mellitus without complication (HCC)   Hypothyroidism   HTN (hypertension)   HLD (hyperlipidemia)   MS (multiple sclerosis) ([place])   Depression with  anxiety   Obesity (BMI 30-39.9)   Assessment and Plan:  Acute on chronic respiratory failure with hypoxia and sepsis due to influenza A and Flu A-induced COPD exacerbation: Patient has severe sepsis with WBC 0.8, heart rate 96, RR 32, lactic acid 5.4, 3.7.  Procalcitonin 46.62.  Chest x-ray showed possible right lower lobe and left lower lobe infiltration, cannot completely rule out pneumonia.  Will start on antibiotics.  - will admit to PCU as inpatient -Bronchodilators -Solu-Medrol 125 mg once, then 80 mg IV bid  -Tamiflu 75 mg twice daily -Start Rocephin and azithromycin empirically -Mucinex for cough  -Incentive spirometry -sputum culture and Bx -Nasal cannula oxygen as needed to maintain O2 saturation 93% or greater -Trend lactic acid level -IV fluid: 2L of LR bolus in ED, followed by 75 cc/h of NS (patient has elevated BNP 426, limiting aggressive IV fluids treatment).  AKI (acute kidney injury) ([place]): Likely due to dehydration and continuation of HCTZ, Diovan -Hold HCTZ, Diovan-HCTZ -IV fluid as above  Neutropenia ([place]): Possibly due to ongoing infection. -Consulted with Dr. Tasia Catchings of hematology -Peripheral smear -Neutropenia precaution  Diabetes mellitus without complication [facility]): Recent A1c 7.1, poorly controlled.  Patient is taking Victoza and metformin -Sliding scale insulin  Hypothyroidism -synthroid  HTN (hypertension):  -IV hydralazine prn -hold HCTZ-Diovan due to AKI and sepsis -Zebeta  HLD (hyperlipidemia) -Lipitor  MS (multiple sclerosis) ([place]): pt is taking prednisone 10 mg daily.  Has chronic left-sided weakness -pt is now on Solu-Medrol IV as above -Follow-up caution  Depression with anxiety -Continue home medications  Obesity (BMI 30-39.9): Body weight 86.2 kg, BMI 33.66 -Healthy diet and exercise -Encourage losing weight    DVT ppx: SQ Lovenox  Code Status: Full code  Family Communication: Yes, patient's husband   at bed side.     Disposition Plan:  Anticipate discharge back to previous environment  Consults called:  Dr. Tasia Catchings of hemetology  Admission status and Level of care: Progressive:   as inpt      Dispo: The patient is from: Home              Anticipated d/c is  to: Home              Anticipated d/c date is: 2 days              Patient currently is not medically stable to d/c.    Severity of Illness:  The appropriate patient status for this patient is INPATIENT. Inpatient status is judged to be reasonable and necessary in order to provide the required intensity of service to ensure the patient's safety. The patient's presenting symptoms, physical exam findings, and initial radiographic and laboratory data in the context of their chronic comorbidities is felt to place them at high risk for further clinical deterioration. Furthermore, it is not anticipated that the patient will be medically stable for discharge from the hospital within 2 midnights of admission.   * I certify that at the point of admission it is my clinical judgment that the patient will require inpatient hospital care spanning beyond 2 midnights from the point of admission due to high intensity of service, high risk for further deterioration and high frequency of surveillance required.*       Date of Service 11/22/2022    Ivor [facility]   If 7PM-7AM, please contact night-coverage [email or web address] 11/22/2022, 3:14 PM patient is on admitted that due to

## 2022-11-22 NOTE — ED Notes (Signed)
MD Blaine Hamper at bedside

## 2022-11-22 NOTE — ED Notes (Signed)
MD made aware of delay in CBC results due to additional testing required for WBC

## 2022-11-22 NOTE — ED Notes (Signed)
RT called for breathing tx. 

## 2022-11-22 NOTE — ED Provider Notes (Signed)
[facility] Provider Note    Event Date/Time   First MD Initiated Contact with Patient 11/22/22 1126     (approximate)   History   Shortness of Breath   HPI  Ashlee Fields is a 66 y.o. female with a history of diabetes, hypertension, hypothyroidism, OSA, osteoporosis, and GERD who presents with shortness of breath over the last 2 days, gradual onset, worsening course, associated with cough as well as with some chills.  The patient denies any vomiting or diarrhea.  She has not had any fevers at home.  She had 2 grandchildren who just had colds but denies other sick contacts.  She was recently on antibiotics for URI.  I reviewed the past medical records.  The patient's most recent outpatient encounter was with urology on 12/18 for follow-up after intravesical Botox.  She has no recent ED visits or admissions.  Physical Exam   Triage Vital Signs: ED Triage Vitals  Enc Vitals Group     BP --      Pulse --      Resp --      Temp --      Temp src --      SpO2 11/22/22 1121 (!) 83 %     Weight 11/22/22 1117 190 lb (86.2 kg)     Height 11/22/22 1117 _0  (1.6 m)     Head Circumference --      Peak Flow --      Pain Score 11/22/22 1117 0     Pain Loc --      Pain Edu? --      Excl. in [place]? --     Most recent vital signs: Vitals:   11/22/22 1200 11/22/22 1411  BP: (!) 167/101 (!) 147/131  Pulse: 93 88  Resp: (!) 31 (!) 27  Temp:  98 F (36.7 C)  SpO2: 95% 93%     General: Alert and oriented, somewhat uncomfortable appearing but in no acute distress. CV:  Good peripheral perfusion.  Resp:  Increased respiratory effort and tachypnea.  Diffuse rhonchi and rales. Abd:  No distention.  Other:  No peripheral edema.   ED Results / Procedures / Treatments   Labs (all labs ordered are listed, but only abnormal results are displayed) Labs Reviewed  RESP PANEL BY RT-PCR (RSV, FLU A&B, COVID)  RVPGX2 - Abnormal; Notable for the following  components:      Result Value   Influenza A by PCR POSITIVE (*)    All other components within normal limits  CBC WITH DIFFERENTIAL/PLATELET - Abnormal; Notable for the following components:   WBC 0.8 (*)    Neutro Abs 0.2 (*)    Lymphs Abs 0.5 (*)    Monocytes Absolute 0.0 (*)    All other components within normal limits  COMPREHENSIVE METABOLIC PANEL - Abnormal; Notable for the following components:   CO2 20 (*)    Glucose, Bld 149 (*)    BUN 31 (*)    Creatinine, Ser 1.41 (*)    Calcium 8.8 (*)    Albumin 3.3 (*)    AST 62 (*)    GFR, Estimated 41 (*)    Anion gap 20 (*)    All other components within normal limits  BRAIN NATRIURETIC PEPTIDE - Abnormal; Notable for the following components:   B Natriuretic Peptide 426.4 (*)    All other components within normal limits  LACTIC ACID, PLASMA - Abnormal; Notable for the following components:  Lactic Acid, Venous 5.4 (*)    All other components within normal limits  LACTIC ACID, PLASMA - Abnormal; Notable for the following components:   Lactic Acid, Venous 3.7 (*)    All other components within normal limits  CULTURE, BLOOD (ROUTINE X 2)  CULTURE, BLOOD (ROUTINE X 2)  EXPECTORATED SPUTUM ASSESSMENT W GRAM STAIN, RFLX TO RESP C  PROCALCITONIN  PATHOLOGIST SMEAR REVIEW  HIV ANTIBODY (ROUTINE TESTING W REFLEX)  STREP PNEUMONIAE URINARY ANTIGEN  LEGIONELLA PNEUMOPHILA SEROGP 1 UR AG  URINALYSIS, COMPLETE (UACMP) WITH MICROSCOPIC  LACTIC ACID, PLASMA  LACTIC ACID, PLASMA  LACTIC ACID, PLASMA  TROPONIN I (HIGH SENSITIVITY)  TROPONIN I (HIGH SENSITIVITY)     EKG  ED ECG REPORT I, Arta Silence, the attending physician, personally viewed and interpreted this ECG.  Date: 11/22/2022 EKG Time: 1119 Rate: 97 Rhythm: normal sinus rhythm QRS Axis: normal Intervals: normal ST/T Wave abnormalities: normal Narrative Interpretation: no evidence of acute ischemia    RADIOLOGY  Chest x-ray: I independently viewed and  interpreted the images; there is a right lower and midlung opacity most consistent with pneumonia.  PROCEDURES:  Critical Care performed: Yes, see critical care procedure note(s)  .Critical Care  Performed by: Arta Silence, MD Authorized by: Arta Silence, MD   Critical care provider statement:    Critical care time (minutes):  30   Critical care time was exclusive of:  Separately billable procedures and treating other patients   Critical care was necessary to treat or prevent imminent or life-threatening deterioration of the following conditions:  Respiratory failure   Critical care was time spent personally by me on the following activities:  Development of treatment plan with patient or surrogate, discussions with consultants, evaluation of patient's response to treatment, examination of patient, ordering and review of laboratory studies, ordering and review of radiographic studies, ordering and performing treatments and interventions, pulse oximetry, re-evaluation of patient's condition, review of old charts and obtaining history from patient or surrogate   Care discussed with: admitting provider      MEDICATIONS ORDERED IN ED: Medications  0.9 %  sodium chloride infusion ( Intravenous New Bag/Given 11/22/22 1516)  ipratropium-albuterol (DUONEB) 0.5-2.5 (3) MG/3ML nebulizer solution 3 mL (has no administration in time range)  albuterol (PROVENTIL) (2.5 MG/3ML) 0.083% nebulizer solution 2.5 mg (has no administration in time range)  dextromethorphan-guaiFENesin (MUCINEX DM) 30-600 MG per 12 hr tablet 1 tablet (has no administration in time range)  ondansetron (ZOFRAN) injection 4 mg (has no administration in time range)  acetaminophen (TYLENOL) tablet 650 mg (has no administration in time range)  hydrALAZINE (APRESOLINE) injection 5 mg (has no administration in time range)  insulin aspart (novoLOG) injection 0-5 Units (has no administration in time range)  insulin aspart  (novoLOG) injection 0-9 Units (has no administration in time range)  azithromycin (ZITHROMAX) 500 mg in sodium chloride 0.9 % 250 mL IVPB (has no administration in time range)  cefTRIAXone (ROCEPHIN) 1 g in sodium chloride 0.9 % 100 mL IVPB (has no administration in time range)  enoxaparin (LOVENOX) injection 40 mg (has no administration in time range)  oseltamivir (TAMIFLU) capsule 75 mg (has no administration in time range)  oseltamivir (TAMIFLU) capsule 30 mg (has no administration in time range)  methylPREDNISolone sodium succinate (SOLU-MEDROL) 125 mg/2 mL injection 80 mg (has no administration in time range)  methylPREDNISolone sodium succinate (SOLU-MEDROL) 125 mg/2 mL injection 125 mg (has no administration in time range)  azithromycin (ZITHROMAX) 500 mg in sodium chloride  0.9 % 250 mL IVPB (0 mg Intravenous Stopped 11/22/22 1403)  cefTRIAXone (ROCEPHIN) 1 g in sodium chloride 0.9 % 100 mL IVPB (0 g Intravenous Stopped 11/22/22 1335)  lactated ringers bolus 1,000 mL (0 mLs Intravenous Stopped 11/22/22 1517)    And  lactated ringers bolus 1,000 mL (0 mLs Intravenous Stopped 11/22/22 1403)     IMPRESSION / MDM / ASSESSMENT AND PLAN / ED COURSE  I reviewed the triage vital signs and the nursing notes.  66 year old female with PMH as noted above presents with worsening cough and shortness of breath over the last 2 days after being around 2 grandchildren that had URIs.  On arrival the patient was hypoxic to the low 80s with increased work of breathing.  Exam reveals very coarse, crackly, and wet sounding breath sounds bilaterally but no peripheral edema.  Differential diagnosis includes, but is not limited to, bacterial pneumonia, COVID-19, influenza, other viral etiology, less likely new onset CHF or other cardiac cause.  We will obtain chest x-ray, lab workup, and reassess.  Due to the patient's hypoxia, work of breathing, and breath sounds, we will start her on BiPAP.  Patient's  presentation is most consistent with acute presentation with potential threat to life or bodily function.  The patient is on the cardiac monitor to evaluate for evidence of arrhythmia and/or significant heart rate changes.   ----------------------------------------- 3:18 PM on 11/22/2022 -----------------------------------------  Respiratory panel is positive for influenza A.  Chest x-ray shows infiltrate as well so I have ordered empiric antibiotics.  CBC shows a WBC count of 0.8 and concern for neutropenia.  This is of unclear etiology.  Lactate is elevated so I started the patient on fluids and treatment per the sepsis protocol.  In terms of the respiratory status she is doing well on the BiPAP and states she is feeling significantly better.  Respiratory rate has improved.  O2 saturation remains in the 90s.  Given the hypoxia the patient will need additional treatment and workup in the hospital.  I consulted Dr. Blaine Hamper from the hospitalist service; based on discussion he agrees to admit the patient.    FINAL CLINICAL IMPRESSION(S) / ED DIAGNOSES   Final diagnoses:  Influenza A  Acute respiratory failure with hypoxia (HCC)  Neutropenia, unspecified type ([place])     Rx / DC Orders   ED Discharge Orders     None        Note:  This document was prepared using Dragon voice recognition software and may include unintentional dictation errors.   Arta Silence, MD 11/22/22 1520

## 2022-11-22 NOTE — Progress Notes (Signed)
  [facility]link following for sepsis protocol. 

## 2022-11-22 NOTE — ED Notes (Signed)
Pt sats 88% on 4L [place] - pt increased to 6L [place] with improvement to 89-92%.

## 2022-11-22 NOTE — ED Provider Triage Note (Signed)
Emergency Medicine Provider Triage Evaluation Note  Ashlee Fields , a 66 y.o. female  was evaluated in triage.  Pt complains of SOB. Sent from [place] for O2 of 80%, placed on 2L. Not on home O2. Has had cough and fever for 2 days.  Review of Systems  Positive: Cough, SOB, fever Negative: Chest pain  Physical Exam  Ht _0  (1.6 m)   Wt 86.2 kg   BMI 33.66 kg/m  Gen:   Awake, no distress   Resp:  Normal effort  MSK:   Moves extremities without difficulty  Other:  Audible wheezes  Medical Decision Making  Medically screening exam initiated at 11:19 AM.  Appropriate orders placed.  Ashlee Fields was informed that the remainder of the evaluation will be completed by another provider, this initial triage assessment does not replace that evaluation, and the importance of remaining in the ED until their evaluation is complete.     Marquette Old, PA-C 11/22/22 1121

## 2022-11-22 NOTE — ED Notes (Signed)
RT called for BiPAP per MD

## 2022-11-22 NOTE — ED Notes (Signed)
Respiratory aware of breathing treatment.

## 2022-11-22 NOTE — Consult Note (Signed)
CODE SEPSIS - PHARMACY COMMUNICATION  **Broad Spectrum Antibiotics should be administered within 1 hour of Sepsis diagnosis**  Time Code Sepsis Called/Page Received: 1214  Antibiotics Ordered: Ceftriaxone, Azithromycin  Time of 1st antibiotic administration: 1216  Additional action taken by pharmacy: N/A  If necessary, Name of Provider/Nurse Contacted: N/A  Will M. Ouida Sills, PharmD PGY-1 Pharmacy Resident 11/22/2022 12:15 PM

## 2022-11-22 NOTE — ED Notes (Signed)
Unable to obtain 2nd set of cultures. 1st set already obtianed - IV antibiotics started as to not delay care

## 2022-11-22 NOTE — ED Notes (Signed)
Lab called to obtain 2nd set of blood cultures

## 2022-11-22 NOTE — ED Triage Notes (Signed)
Pt sent by [facility] for [facility]- pt was 80% on RA and was placed on 2L [place] before brought over here- pt has had a cough and SHOB for 2 days- pt was around her grandkids recently who have a cold- pt denies chest pain- pt has been on antibiotics for an URI recently- pt not normally on O2

## 2022-11-22 NOTE — ED Notes (Signed)
Xray at bedside

## 2022-11-23 DIAGNOSIS — J9621 Acute and chronic respiratory failure with hypoxia: Secondary | ICD-10-CM | POA: Diagnosis not present

## 2022-11-23 DIAGNOSIS — J101 Influenza due to other identified influenza virus with other respiratory manifestations: Secondary | ICD-10-CM | POA: Diagnosis not present

## 2022-11-23 DIAGNOSIS — J441 Chronic obstructive pulmonary disease with (acute) exacerbation: Secondary | ICD-10-CM | POA: Diagnosis not present

## 2022-11-23 DIAGNOSIS — R7881 Bacteremia: Secondary | ICD-10-CM | POA: Diagnosis present

## 2022-11-23 DIAGNOSIS — A419 Sepsis, unspecified organism: Secondary | ICD-10-CM | POA: Diagnosis not present

## 2022-11-23 LAB — BLOOD CULTURE ID PANEL (REFLEXED) - BCID2

## 2022-11-23 LAB — CBC WITH DIFFERENTIAL/PLATELET
Abs Immature Granulocytes: 0.03 10*3/uL (ref 0.00–0.07)
Basophils Absolute: 0.1 10*3/uL (ref 0.0–0.1)
Basophils Relative: 2 %
Eosinophils Absolute: 0 10*3/uL (ref 0.0–0.5)
Eosinophils Relative: 0 %
HCT: 36.9 % (ref 36.0–46.0)
Hemoglobin: 11.7 g/dL — ABNORMAL LOW (ref 12.0–15.0)
Immature Granulocytes: 1 %
Lymphocytes Relative: 8 %
Lymphs Abs: 0.2 10*3/uL — ABNORMAL LOW (ref 0.7–4.0)
MCH: 29.8 pg (ref 26.0–34.0)
MCHC: 31.7 g/dL (ref 30.0–36.0)
MCV: 94.1 fL (ref 80.0–100.0)
Monocytes Absolute: 0 10*3/uL — ABNORMAL LOW (ref 0.1–1.0)
Monocytes Relative: 1 %
Neutro Abs: 2.2 10*3/uL (ref 1.7–7.7)
Neutrophils Relative %: 88 %
Platelets: 212 10*3/uL (ref 150–400)
RBC: 3.92 MIL/uL (ref 3.87–5.11)
RDW: 15.2 % (ref 11.5–15.5)
WBC: 2.5 10*3/uL — ABNORMAL LOW (ref 4.0–10.5)
nRBC: 0 % (ref 0.0–0.2)

## 2022-11-23 LAB — CBG MONITORING, ED
Glucose-Capillary: 172 mg/dL — ABNORMAL HIGH (ref 70–99)
Glucose-Capillary: 155 mg/dL — ABNORMAL HIGH (ref 70–99)
Glucose-Capillary: 147 mg/dL — ABNORMAL HIGH (ref 70–99)
Glucose-Capillary: 126 mg/dL — ABNORMAL HIGH (ref 70–99)

## 2022-11-23 LAB — BASIC METABOLIC PANEL
Anion gap: 10 (ref 5–15)
BUN: 22 mg/dL (ref 8–23)
CO2: 24 mmol/L (ref 22–32)
Calcium: 8 mg/dL — ABNORMAL LOW (ref 8.9–10.3)
Chloride: 107 mmol/L (ref 98–111)
Creatinine, Ser: 0.82 mg/dL (ref 0.44–1.00)
GFR, Estimated: >60 mL/min (ref >60)
Glucose, Bld: 187 mg/dL — ABNORMAL HIGH (ref 70–99)
Potassium: 4 mmol/L (ref 3.5–5.1)
Sodium: 141 mmol/L (ref 135–145)

## 2022-11-23 LAB — LACTIC ACID, PLASMA: Lactic Acid, Venous: 2.8 mmol/L (ref 0.5–1.9)

## 2022-11-23 MED ORDER — OSELTAMIVIR PHOSPHATE 75 MG PO CAPS
75.0000 mg | ORAL_CAPSULE | Freq: Two times a day (BID) | ORAL | Status: AC
  Administered 2022-11-23 – 2022-11-26 (×8): 75 mg via ORAL
  Filled 2022-11-23 (×9): qty 1

## 2022-11-23 MED ORDER — SODIUM CHLORIDE 0.9 % IV SOLN
2.0000 g | INTRAVENOUS | Status: AC
  Administered 2022-11-23 – 2022-11-26 (×4): 2 g via INTRAVENOUS
  Filled 2022-11-23 (×4): qty 20

## 2022-11-23 MED ORDER — SODIUM CHLORIDE 0.9 % IV SOLN
500.0000 mg | INTRAVENOUS | Status: DC
  Administered 2022-11-23: 500 mg via INTRAVENOUS
  Filled 2022-11-23 (×2): qty 5

## 2022-11-23 NOTE — Assessment & Plan Note (Signed)
-  Synthroid

## 2022-11-23 NOTE — Assessment & Plan Note (Signed)
Likely due to acute infection and myelosuppression. Hematology consulted on admission. WBC trend: 0.8 >> 2.5, no longer neutropenic. Follow CBC

## 2022-11-23 NOTE — Assessment & Plan Note (Addendum)
Due to Flu and pneumonia.  Transition from IV to PO steroids tomorrow Continue neb treatments, supportive care O2 per protocol

## 2022-11-23 NOTE — Assessment & Plan Note (Signed)
Body mass index is 33.66 kg/m. Complicates overall care and prognosis.  Recommend lifestyle modifications including physical activity and diet for weight loss and overall long-term health.

## 2022-11-23 NOTE — Assessment & Plan Note (Signed)
-  Continue home medications 

## 2022-11-23 NOTE — Assessment & Plan Note (Signed)
Pt is on prednisone 10 mg daily.   Has chronic left-sided weakness -Now on Solu-Medrol IV as above

## 2022-11-23 NOTE — Assessment & Plan Note (Signed)
Started on Tamiflu x 5 days Supportive care per orders

## 2022-11-23 NOTE — Progress Notes (Addendum)
Progress Note   Patient: Ashlee Fields OVF:[id number] DOB: 10/04/1956 DOA: 11/22/2022     1 DOS: the patient was seen and examined on 11/23/2022   Brief hospital course: Ashlee Fields is a 66 y.o. female with medical history significant of hypertension, hyperlipidemia, diabetes mellitus, COPD, GERD, hypothyroidism, depression with anxiety, OSA on CPAP, multiple sclerosis with left-sided weakness, pulmonary hypertension, who presented on 11/22/2022 for evaluation of worsening shortness breath, with productive cough x 2 days.  In the ED, patient was noted to have severe respiratory distress, oxygen desaturation to 83% on room air, could not speak in full sentence, initially started 6 L/min oxygen, but due to ongoing respiratory distress was started on BiPAP in ED.   ED course -- Positive flu A PCR, lactic acid 4.5, 3.7, procalcitonin 46.62, BNP 426, troponin level 10, neutropenia with WBC 0.8 (neutrophils 13%, lymphocyte 61%).  Noted prior WBC 11.9 on 09/15/2022.  AKI with creatinine 1.41 (baseline creatinine 0.7 on 09/15/22).  Temperature normal, blood pressure 167/101, heart rate 96, RR 32.  Chest x-ray showed possible right lower lobe and left lower lobe infiltration, concerning for pneumonia.  Patient was admitted to the hospital and started on empiric IV antibiotics for likely bacterial pneumonia secondary to influenza A infection.  12/31: remained on BiPAP with ongoing tachypnea and increased work of breathing this morning.  Tolerated two hour break this AM.  Feels improving overall.   Assessment and Plan: * Influenza A Started on Tamiflu x 5 days Supportive care per orders  Acute on chronic respiratory failure with hypoxia ([place]) Due to Flu A and secondary pneumonia. --BiPAP PRN --Supplement O2, goal spO2 88-95%, wean as tolerated --Pulmonary hygiene --Mgmt of Flu and pneumonia as outlined  Severe sepsis ([place]) POA with WBC 0.8, heart rate 96, RR 32, lactic acid 5.4, 3.7.   Procalcitonin 46.62.  Chest x-ray findings consistent with pneumonia.  Flu A positive. --Continue antibiotics --Follow cultures  COPD exacerbation (HCC) Due to Flu and pneumonia.  Continue IV steroids, neb treatments and supportive care O2 per protocol  Neutropenia (HCC) Likely due to acute infection and myelosuppression. Hematology consulted on admission. WBC trend: 0.8 >> 2.5, no longer neutropenic. Follow CBC  AKI (acute kidney injury) ([place]) Likely due to dehydration and continuation of HCTZ, Diovan -Hold HCTZ, Diovan-HCTZ -IV fluid as above  Diabetes mellitus without complication (HCC) Recent A1c 7.1, poorly controlled.   Patient is taking Victoza and metformin -Sliding scale insulin  Hypothyroidism -Synthroid   HTN (hypertension) -IV hydralazine prn -hold HCTZ-Diovan due to AKI and sepsis -Zebeta  HLD (hyperlipidemia) -Lipitor   MS (multiple sclerosis) (HCC) Pt is on prednisone 10 mg daily.   Has chronic left-sided weakness -Now on Solu-Medrol IV as above  Depression with anxiety -Continue home medications   Obesity (BMI 30-39.9) Body mass index is 33.66 kg/m. Complicates overall care and prognosis.  Recommend lifestyle modifications including physical activity and diet for weight loss and overall long-term health.   Positive blood culture Admission blood culture positive for Strep pneumoniae. --Follow culture for susceptibilities --Repeat Bcx tomorrow        Subjective: Pt seen in ED holding for a bed, husband at bedside.  She reports feeling better.  On Bipap but states she had about 2 hour break earlier this AM.  It is helping her breath more easily.  No other acute complaints.  Physical Exam: Vitals:   11/23/22 1000 11/23/22 1244 11/23/22 1400 11/23/22 1500  BP: (!) 167/88  (!) 144/72 (!) 141/87  Pulse: (!) 116  93 88  Resp: 20  18 (!) 25  Temp:  98.6 F (37 C)    TempSrc:  Oral    SpO2: 91%  96% 95%  Weight:      Height:        General exam: awake, alert, no acute distress, obese HEENT: wearing Bipap mask, hearing grossly normal  Respiratory system: very diminished breath sounds with poor air movement, no expiratory wheezes, increased respiratory effort with accessory muscle use, on BiPAP, spO2 upper 90's on monitor. Cardiovascular system: normal S1/S2, RRR, no pedal edema.   Gastrointestinal system: soft, NT, ND, no HSM felt, +bowel sounds. Central nervous system: A&O x3. no gross focal neurologic deficits, normal speech Extremities: moves all, no edema, normal tone Skin: dry, intact, normal temperature Psychiatry: normal mood, congruent affect, judgement and insight appear normal   Data Reviewed:  Notable labs --- WBC 0.8 >> 2.5, glucose 187, lactic acid 2.8 from 4.5.  Hbg 11.7 (from 13.6)   Strep pneumo antigen positive. Blood culture positive for strep pneumo.  Family Communication: husband at bedside on rounds  Disposition: Status is: Inpatient Remains inpatient appropriate because: remains on IV therapies as outlined, ongoing respiratory distress on bipap     Planned Discharge Destination: Home    Time spent: 45 minutes  Author: Ezekiel Slocumb, DO 11/23/2022 3:43 PM  For on call review [email or web address].

## 2022-11-23 NOTE — ED Notes (Signed)
Pt requested to be taken off bipap. Pt placed on [place] 6L and O2sats at 94%.

## 2022-11-23 NOTE — Progress Notes (Signed)
CROSS COVER NOTE  NAME: LELA MURFIN MRN: [id number] DOB : March 23, 1956       HPI/Events of Note   Nurse reported patient found with home meds in room. Patietn had denied taking any  Assessment and  Interventions   Assessment:  Plan: Hold ativan , metoprolool and atovastatin tonight Safe secure/lock patients home meds per policy       Kathlene Cote NP [facility]

## 2022-11-23 NOTE — ED Notes (Signed)
BiPAP replaced for pt to take a nap

## 2022-11-23 NOTE — Assessment & Plan Note (Signed)
Resolved. Likely due to dehydration and continuation Diovan -Resume home Diovan -Stop IV fluid  -Monitor BMP

## 2022-11-23 NOTE — Assessment & Plan Note (Signed)
Admission blood culture positive for Strep pneumoniae. --Follow culture for susceptibilities --Repeat Bcx tomorrow

## 2022-11-23 NOTE — Progress Notes (Signed)
PHARMACY NOTE:  ANTIMICROBIAL RENAL DOSAGE ADJUSTMENT  Current antimicrobial regimen includes a mismatch between antimicrobial dosage and estimated renal function.  As per policy approved by the Pharmacy & Therapeutics and Medical Executive Committees, the antimicrobial dosage will be adjusted accordingly.  Current antimicrobial dosage: Tamiflu 30 mg BID  Indication: Influenza  Renal Function:  Estimated Creatinine Clearance: 68.3 mL/min (by C-G formula based on SCr of 0.82 mg/dL).    Antimicrobial dosage has been changed to:  Tamiflu 75 mg BID  Thank you for allowing pharmacy to be a part of this patient's care.  Benita Gutter, [facility] 11/23/2022 8:09 AM

## 2022-11-23 NOTE — ED Notes (Signed)
Pt purewick and bed linen changed.

## 2022-11-23 NOTE — Assessment & Plan Note (Signed)
-  Lipitor

## 2022-11-23 NOTE — Progress Notes (Signed)
PHARMACY - PHYSICIAN COMMUNICATION CRITICAL VALUE ALERT - BLOOD CULTURE IDENTIFICATION (BCID)  Results for orders placed or performed during the hospital encounter of 11/22/22  Resp panel by RT-PCR (RSV, Flu A&B, Covid) Anterior Nasal Swab     Status: Abnormal   Collection Time: 11/22/22 11:29 AM   Specimen: Anterior Nasal Swab  Result Value Ref Range Status   SARS Coronavirus 2 by RT PCR NEGATIVE NEGATIVE Final    Comment: (NOTE) SARS-CoV-2 target nucleic acids are NOT DETECTED.  The SARS-CoV-2 RNA is generally detectable in upper respiratory specimens during the acute phase of infection. The lowest concentration of SARS-CoV-2 viral copies this assay can detect is 138 copies/mL. A negative result does not preclude SARS-Cov-2 infection and should not be used as the sole basis for treatment or other patient management decisions. A negative result may occur with  improper specimen collection/handling, submission of specimen other than nasopharyngeal swab, presence of viral mutation(s) within the areas targeted by this assay, and inadequate number of viral copies(<138 copies/mL). A negative result must be combined with clinical observations, patient history, and epidemiological information. The expected result is Negative.  Fact Sheet for Patients:  EntrepreneurPulse.com.au  Fact Sheet for Healthcare Providers:  IncredibleEmployment.be  This test is no t yet approved or cleared by the [place] FDA and  has been authorized for detection and/or diagnosis of SARS-CoV-2 by FDA under an Emergency Use Authorization (EUA). This EUA will remain  in effect (meaning this test can be used) for the duration of the COVID-19 declaration under Section 564(b)(1) of the Act, 21 U.S.C.section 360bbb-3(b)(1), unless the authorization is terminated  or revoked sooner.       Influenza A by PCR POSITIVE (A) NEGATIVE Final   Influenza B by PCR NEGATIVE NEGATIVE  Final    Comment: (NOTE) The Xpert Xpress SARS-CoV-2/FLU/RSV plus assay is intended as an aid in the diagnosis of influenza from Nasopharyngeal swab specimens and should not be used as a sole basis for treatment. Nasal washings and aspirates are unacceptable for Xpert Xpress SARS-CoV-2/FLU/RSV testing.  Fact Sheet for Patients: EntrepreneurPulse.com.au  Fact Sheet for Healthcare Providers: IncredibleEmployment.be  This test is not yet approved or cleared by the [place] FDA and has been authorized for detection and/or diagnosis of SARS-CoV-2 by FDA under an Emergency Use Authorization (EUA). This EUA will remain in effect (meaning this test can be used) for the duration of the COVID-19 declaration under Section 564(b)(1) of the Act, 21 U.S.C. section 360bbb-3(b)(1), unless the authorization is terminated or revoked.     Resp Syncytial Virus by PCR NEGATIVE NEGATIVE Final    Comment: (NOTE) Fact Sheet for Patients: EntrepreneurPulse.com.au  Fact Sheet for Healthcare Providers: IncredibleEmployment.be  This test is not yet approved or cleared by the [place] FDA and has been authorized for detection and/or diagnosis of SARS-CoV-2 by FDA under an Emergency Use Authorization (EUA). This EUA will remain in effect (meaning this test can be used) for the duration of the COVID-19 declaration under Section 564(b)(1) of the Act, 21 U.S.C. section 360bbb-3(b)(1), unless the authorization is terminated or revoked.  Performed at [facility], [address] [place], [place] [address]   Culture, blood (routine x 2)     Status: None (Preliminary result)   Collection Time: 11/22/22  1:13 PM   Specimen: BLOOD  Result Value Ref Range Status   Specimen Description BLOOD RIGHT ANTECUBITAL  Final   Special Requests   Final    BOTTLES DRAWN AEROBIC AND ANAEROBIC  Blood Culture adequate volume    Culture  Setup Time   Final    GRAM POSITIVE COCCI IN BOTH AEROBIC AND ANAEROBIC BOTTLES Organism ID to follow CRITICAL RESULT CALLED TO, READ BACK BY AND VERIFIED WITH: Hawkins Seaman @ 0302 11/23/22 LFD Performed at [facility], [address] [place], [place] [address]    Culture GRAM POSITIVE COCCI  Final   Report Status PENDING  Incomplete  Blood Culture ID Panel (Reflexed)     Status: Abnormal   Collection Time: 11/22/22  1:13 PM  Result Value Ref Range Status   Enterococcus faecalis NOT DETECTED NOT DETECTED Final   Enterococcus Faecium NOT DETECTED NOT DETECTED Final   Listeria monocytogenes NOT DETECTED NOT DETECTED Final   Staphylococcus species NOT DETECTED NOT DETECTED Final   Staphylococcus aureus (BCID) NOT DETECTED NOT DETECTED Final   Staphylococcus epidermidis NOT DETECTED NOT DETECTED Final   Staphylococcus lugdunensis NOT DETECTED NOT DETECTED Final   Streptococcus species DETECTED (A) NOT DETECTED Final    Comment: CRITICAL RESULT CALLED TO, READ BACK BY AND VERIFIED WITH: Karsynn Deweese @ 0302 11/23/22 LFD    Streptococcus agalactiae NOT DETECTED NOT DETECTED Final   Streptococcus pneumoniae DETECTED (A) NOT DETECTED Final    Comment: CRITICAL RESULT CALLED TO, READ BACK BY AND VERIFIED WITH: Terriyah Westra@ 0302 11/23/22 LFD    Streptococcus pyogenes NOT DETECTED NOT DETECTED Final   A.calcoaceticus-baumannii NOT DETECTED NOT DETECTED Final   Bacteroides fragilis NOT DETECTED NOT DETECTED Final   Enterobacterales NOT DETECTED NOT DETECTED Final   Enterobacter cloacae complex NOT DETECTED NOT DETECTED Final   Escherichia coli NOT DETECTED NOT DETECTED Final   Klebsiella aerogenes NOT DETECTED NOT DETECTED Final   Klebsiella oxytoca NOT DETECTED NOT DETECTED Final   Klebsiella pneumoniae NOT DETECTED NOT DETECTED Final   Proteus species NOT DETECTED NOT DETECTED Final   Salmonella species NOT DETECTED NOT DETECTED Final   Serratia marcescens NOT  DETECTED NOT DETECTED Final   Haemophilus influenzae NOT DETECTED NOT DETECTED Final   Neisseria meningitidis NOT DETECTED NOT DETECTED Final   Pseudomonas aeruginosa NOT DETECTED NOT DETECTED Final   Stenotrophomonas maltophilia NOT DETECTED NOT DETECTED Final   Candida albicans NOT DETECTED NOT DETECTED Final   Candida auris NOT DETECTED NOT DETECTED Final   Candida glabrata NOT DETECTED NOT DETECTED Final   Candida krusei NOT DETECTED NOT DETECTED Final   Candida parapsilosis NOT DETECTED NOT DETECTED Final   Candida tropicalis NOT DETECTED NOT DETECTED Final   Cryptococcus neoformans/gattii NOT DETECTED NOT DETECTED Final    Comment: Performed at [facility], [address] [place],  [address]    BCID Results: 2 bottles (same set) of 4 w/ Streptococcus pneumoniae no resistance.  Pt on currently on Ceftriaxone 1 gm and Azithromycin 500 mg daily for CAP.  Name of provider contacted: Rachael Fee, NP   Changes to prescribed antibiotics required: Increase Ceftriaxone to 2 gm daily.  Renda Rolls, PharmD, [facility] 11/23/2022 3:11 AM

## 2022-11-23 NOTE — Assessment & Plan Note (Signed)
-  IV hydralazine prn -hold HCTZ-Diovan due to AKI and sepsis -Zebeta

## 2022-11-23 NOTE — Assessment & Plan Note (Signed)
Recent A1c 7.1, poorly controlled.   Patient is taking Victoza and metformin -Sliding scale insulin

## 2022-11-23 NOTE — Assessment & Plan Note (Signed)
Due to Flu A and secondary pneumonia. --BiPAP PRN --Supplement O2, goal spO2 88-95%, wean as tolerated --Pulmonary hygiene --Mgmt of Flu and pneumonia as outlined

## 2022-11-23 NOTE — Hospital Course (Signed)
Ashlee Fields is a 66 y.o. female with medical history significant of hypertension, hyperlipidemia, diabetes mellitus, COPD, GERD, hypothyroidism, depression with anxiety, OSA on CPAP, multiple sclerosis with left-sided weakness, pulmonary hypertension, who presented on 11/22/2022 for evaluation of worsening shortness breath, with productive cough x 2 days.  In the ED, patient was noted to have severe respiratory distress, oxygen desaturation to 83% on room air, could not speak in full sentence, initially started 6 L/min oxygen, but due to ongoing respiratory distress was started on BiPAP in ED.   ED course -- Positive flu A PCR, lactic acid 4.5, 3.7, procalcitonin 46.62, BNP 426, troponin level 10, neutropenia with WBC 0.8 (neutrophils 13%, lymphocyte 61%).  Noted prior WBC 11.9 on 09/15/2022.  AKI with creatinine 1.41 (baseline creatinine 0.7 on 09/15/22).  Temperature normal, blood pressure 167/101, heart rate 96, RR 32.  Chest x-ray showed possible right lower lobe and left lower lobe infiltration, concerning for pneumonia.  Patient was admitted to the hospital and started on empiric IV antibiotics for likely bacterial pneumonia secondary to influenza A infection.  12/31: remained on BiPAP with ongoing tachypnea and increased work of breathing this morning.  Tolerated two hour break this AM.  Feels improving overall.   1/1--1/2: pt improving slowly, still on oxygen. Positive blood culture with Strep pneumo, ID consulted.

## 2022-11-23 NOTE — Assessment & Plan Note (Signed)
POA with WBC 0.8, heart rate 96, RR 32, lactic acid 5.4, 3.7.  Procalcitonin 46.62.  Chest x-ray findings consistent with pneumonia.  Flu A positive. --Continue antibiotics --Follow cultures

## 2022-11-24 ENCOUNTER — Encounter: Payer: Self-pay | Admitting: Internal Medicine

## 2022-11-24 DIAGNOSIS — J9621 Acute and chronic respiratory failure with hypoxia: Secondary | ICD-10-CM | POA: Diagnosis not present

## 2022-11-24 DIAGNOSIS — J101 Influenza due to other identified influenza virus with other respiratory manifestations: Secondary | ICD-10-CM | POA: Diagnosis not present

## 2022-11-24 DIAGNOSIS — E876 Hypokalemia: Secondary | ICD-10-CM | POA: Insufficient documentation

## 2022-11-24 DIAGNOSIS — J9601 Acute respiratory failure with hypoxia: Secondary | ICD-10-CM

## 2022-11-24 DIAGNOSIS — D709 Neutropenia, unspecified: Secondary | ICD-10-CM | POA: Diagnosis not present

## 2022-11-24 LAB — CBC
HCT: 34.7 % — ABNORMAL LOW (ref 36.0–46.0)
Hemoglobin: 10.9 g/dL — ABNORMAL LOW (ref 12.0–15.0)
MCH: 29.5 pg (ref 26.0–34.0)
MCHC: 31.4 g/dL (ref 30.0–36.0)
MCV: 93.8 fL (ref 80.0–100.0)
Platelets: 176 10*3/uL (ref 150–400)
RBC: 3.7 MIL/uL — ABNORMAL LOW (ref 3.87–5.11)
RDW: 15.3 % (ref 11.5–15.5)
WBC: 6.2 10*3/uL (ref 4.0–10.5)
nRBC: 0.3 % — ABNORMAL HIGH (ref 0.0–0.2)

## 2022-11-24 LAB — CBG MONITORING, ED
Glucose-Capillary: 112 mg/dL — ABNORMAL HIGH (ref 70–99)
Glucose-Capillary: 158 mg/dL — ABNORMAL HIGH (ref 70–99)
Glucose-Capillary: 158 mg/dL — ABNORMAL HIGH (ref 70–99)

## 2022-11-24 LAB — BASIC METABOLIC PANEL
Anion gap: 9 (ref 5–15)
BUN: 20 mg/dL (ref 8–23)
CO2: 24 mmol/L (ref 22–32)
Calcium: 7.5 mg/dL — ABNORMAL LOW (ref 8.9–10.3)
Chloride: 107 mmol/L (ref 98–111)
Creatinine, Ser: 0.7 mg/dL (ref 0.44–1.00)
GFR, Estimated: >60 mL/min (ref >60)
Glucose, Bld: 132 mg/dL — ABNORMAL HIGH (ref 70–99)
Potassium: 3.2 mmol/L — ABNORMAL LOW (ref 3.5–5.1)
Sodium: 140 mmol/L (ref 135–145)

## 2022-11-24 LAB — LACTIC ACID, PLASMA: Lactic Acid, Venous: 1.2 mmol/L (ref 0.5–1.9)

## 2022-11-24 LAB — MAGNESIUM: Magnesium: 2.2 mg/dL (ref 1.7–2.4)

## 2022-11-24 MED ORDER — AZITHROMYCIN 250 MG PO TABS
500.0000 mg | ORAL_TABLET | Freq: Every day | ORAL | Status: DC
  Administered 2022-11-24 – 2022-11-25 (×2): 500 mg via ORAL
  Filled 2022-11-24 (×2): qty 1

## 2022-11-24 MED ORDER — VALSARTAN-HYDROCHLOROTHIAZIDE 320-12.5 MG PO TABS: 1.0000 | ORAL_TABLET | Freq: Every day | ORAL | Status: DC

## 2022-11-24 MED ORDER — IRBESARTAN 150 MG PO TABS
300.0000 mg | ORAL_TABLET | Freq: Every day | ORAL | Status: DC
  Administered 2022-11-24 – 2022-11-27 (×4): 300 mg via ORAL
  Filled 2022-11-24 (×5): qty 2

## 2022-11-24 MED ORDER — MENTHOL 3 MG MT LOZG
1.0000 | LOZENGE | OROMUCOSAL | Status: DC | PRN
  Filled 2022-11-24: qty 9

## 2022-11-24 MED ORDER — METHYLPREDNISOLONE SODIUM SUCC 125 MG IJ SOLR
60.0000 mg | Freq: Two times a day (BID) | INTRAMUSCULAR | Status: DC
  Administered 2022-11-24: 60 mg via INTRAVENOUS
  Filled 2022-11-24: qty 2

## 2022-11-24 MED ORDER — IPRATROPIUM-ALBUTEROL 0.5-2.5 (3) MG/3ML IN SOLN
3.0000 mL | Freq: Four times a day (QID) | RESPIRATORY_TRACT | Status: DC
  Administered 2022-11-25 – 2022-11-26 (×7): 3 mL via RESPIRATORY_TRACT
  Filled 2022-11-24 (×7): qty 3

## 2022-11-24 MED ORDER — POTASSIUM CHLORIDE CRYS ER 20 MEQ PO TBCR
40.0000 meq | EXTENDED_RELEASE_TABLET | Freq: Once | ORAL | Status: AC
  Administered 2022-11-24: 40 meq via ORAL
  Filled 2022-11-24: qty 2

## 2022-11-24 MED ORDER — HYDROCHLOROTHIAZIDE 12.5 MG PO TABS
12.5000 mg | ORAL_TABLET | Freq: Every day | ORAL | Status: DC
  Administered 2022-11-24 – 2022-11-27 (×4): 12.5 mg via ORAL
  Filled 2022-11-24 (×4): qty 1

## 2022-11-24 NOTE — Assessment & Plan Note (Addendum)
Replaced on 11/24/22 for K 3.2  Monitor BMP, Mg level

## 2022-11-24 NOTE — ED Notes (Signed)
Pt. Repositioned in bed for breakfast. Sitting up, denies pain, breathing very wet sounding. Dr. Arbutus Ped aware. Pt. Denies any further need currently, NAD. Pt. Ate entire breakfast tray, pancake, oatmeal, juice, apple sauce.

## 2022-11-24 NOTE — Progress Notes (Signed)
PHARMACIST - PHYSICIAN COMMUNICATION  CONCERNING: Antibiotic IV to Oral Route Change Policy  RECOMMENDATION: This patient is receiving azithromycin by the intravenous route.  Based on criteria approved by the Pharmacy and Therapeutics Committee, the antibiotic(s) is/are being converted to the equivalent oral dose form(s).   DESCRIPTION: These criteria include: Patient being treated for a respiratory tract infection, urinary tract infection, cellulitis or clostridium difficile associated diarrhea if on metronidazole The patient is not neutropenic and does not exhibit a GI malabsorption state The patient is eating (either orally or via tube) and/or has been taking other orally administered medications for a least 24 hours The patient is improving clinically and has a Tmax < 100.5  If you have questions about this conversion, please contact the [facility] 11/24/22

## 2022-11-24 NOTE — ED Notes (Signed)
This RN to bedside, pt. Awake, requesting bipap removed. Bipap removed, 4L [place] inititated. Will monitor O2 sat and adjust O2 as needed.

## 2022-11-24 NOTE — ED Notes (Signed)
Dr. Arbutus Ped at bedside

## 2022-11-24 NOTE — Consult Note (Signed)
Hematology/Oncology Consult note [phone] [phone]      Patient Care Team: [name], MD as PCP - General (Internal Medicine)   Name of the patient: Ashlee Fields  [id number]  [birth year]   REASON FOR COSULTATION:  neutropenia History of presenting illness-  67 y.o. female with PMH listed at below who presents to ER for evaluation of productive cough and shortness of breath.  Denies any fever, chills, chest pain. In emergency room, patient was noted to have severe respiratory distress, hypoxia to 83% on room air. Chest x-ray showed right lower greater than mid lung and a possible minimal left lower lung heterogeneous airspace opacification, this could be due to pneumonia or asymmetric pulmonary edema. Her initial blood work showed leukopenia 0.8, absolute neutrophil 0.2. + Influenza A Patient was admitted for acute on chronic respiratory failure with hypoxia and sepsis due to influenza A and COPD exacerbation. Hematology was consulted for neutropenia.  I discussed the case with admitting physician Dr. Blaine Hamper.  Acute neutropenia is likely due to severe sepsis.  Her neutrophil has been monitored closely.   Allergies  Allergen Reactions   Penicillins Hives    Did it involve swelling of the face/tongue/throat, SOB, or low BP? No Did it involve sudden or severe rash/hives, skin peeling, or any reaction on the inside of your mouth or nose? Yes Did you need to seek medical attention at a hospital or doctor's office? Unknown When did it last happen?      20 + years If all above answers are "NO", may proceed with cephalosporin use.     Other Hives    succinylsulfathiazole   Ibuprofen Rash   Sulfa Antibiotics Hives, Nausea And Vomiting, Itching and Rash    Patient Active Problem List   Diagnosis Date Noted   Positive blood culture 11/23/2022   Influenza A 11/22/2022   Acute respiratory failure with hypoxia ([place]) 11/22/2022   HTN (hypertension)  11/22/2022   HLD (hyperlipidemia) 11/22/2022   AKI (acute kidney injury) ([place]) 11/22/2022   Obesity (BMI 30-39.9) 11/22/2022   Severe sepsis ([place]) 11/22/2022   Neutropenia ([place]) 11/22/2022   Hematuria 02/29/2020   Acute on chronic respiratory failure with hypoxia ([place]) 02/29/2020   Hypothyroidism    GERD (gastroesophageal reflux disease)    S/P TKR (total knee replacement) using cement, right 02/28/2020   Pulmonary hypertension ([place]) 08/05/2017   SOB (shortness of breath) on exertion 07/10/2017   COPD exacerbation ([place]) 10/07/2016   Fibrocystic breast disease 10/07/2016   Hyperlipidemia, unspecified 10/07/2016   Obesity 10/07/2016   Osteoarthritis 10/07/2016   Gait disturbance 10/07/2016   Other fatigue 10/07/2016   Left hemiplegia ([place]) 10/07/2016   MS (multiple sclerosis) ([place]) 05/06/2016   Diabetes mellitus without complication ([place]) 22/29/7989   HTN, goal below 140/90 02/27/2015   Adiposity 01/02/2015   Anxiety 01/02/2015   Depression with anxiety 01/02/2015   Environmental allergies 01/02/2015   Tobacco dependence syndrome 01/02/2015     Past Medical History:  Diagnosis Date   Anxiety    Arthritis    Depression    Diabetes mellitus without complication (HCC)    GERD (gastroesophageal reflux disease)    Hypertension    Hypothyroidism    Multiple sclerosis ([place]) 1980   Osteoporosis    Sleep apnea    Vision abnormalities      Past Surgical History:  Procedure Laterality Date   CESAREAN SECTION     COLONOSCOPY     ESOPHAGOGASTRODUODENOSCOPY (EGD) WITH PROPOFOL N/A 11/20/2020  Procedure: ESOPHAGOGASTRODUODENOSCOPY (EGD) WITH PROPOFOL;  Surgeon: [name], MD;  Location: [facility] ENDOSCOPY;  Service: Endoscopy;  Laterality: N/A;   JOINT REPLACEMENT     REPLACEMENT TOTAL KNEE Left 2009   RIGHT HEART CATH Right 08/05/2017   Procedure: RIGHT HEART CATH;  Surgeon: Teodoro Spray, MD;  Location: [facility];  Service: Cardiovascular;  Laterality:  Right;   TOTAL KNEE ARTHROPLASTY Right 02/28/2020   Procedure: TOTAL KNEE ARTHROPLASTY;  Surgeon: Thornton Park, MD;  Location: [facility] OR[facility];  Service: Orthopedics;  Laterality: Right;    Social History   Socioeconomic History   Marital status: Married    Spouse name: Not on file   Number of children: Not on file   Years of education: Not on file   Highest education level: Not on file  Occupational History   Not on file  Tobacco Use   Smoking status: Former    Packs/day: 0.00    Types: Cigarettes    Quit date: 2018    Years since quitting: 6.0    Passive exposure: Past   Smokeless tobacco: Never  Vaping Use   Vaping Use: Never used  Substance and Sexual Activity   Alcohol use: Never   Drug use: Never   Sexual activity: Not on file  Other Topics Concern   Not on file  Social History Narrative   Not on file   Social Determinants of Health   Financial Resource Strain: Not on file  Food Insecurity: No Food Insecurity (11/24/2022)   Hunger Vital Sign    Worried About Running Out of Food in the Last Year: Never true    Ran Out of Food in the Last Year: Never true  Transportation Needs: No Transportation Needs (11/24/2022)   PRAPARE - Hydrologist (Medical): No    Lack of Transportation (Non-Medical): No  Physical Activity: Not on file  Stress: Not on file  Social Connections: Not on file  Intimate Partner Violence: Not At Risk (11/24/2022)   Humiliation, Afraid, Rape, and Kick questionnaire    Fear of Current or Ex-Partner: No    Emotionally Abused: No    Physically Abused: No    Sexually Abused: No     Family History  Problem Relation Age of Onset   Breast cancer Paternal Grandmother    Heart disease Mother    Kidney failure Father    Colon cancer Maternal Grandmother      Current Facility-Administered Medications:    acetaminophen (TYLENOL) tablet 650 mg, 650 mg, Oral, Q6H PRN, Ivor Costa, MD, 650 mg at 11/24/22 1040   albuterol  (PROVENTIL) (2.5 MG/3ML) 0.083% nebulizer solution 2.5 mg, 2.5 mg, Nebulization, Q4H PRN, Ivor Costa, MD   atorvastatin (LIPITOR) tablet 80 mg, 80 mg, Oral, QHS, Niu, Soledad Gerlach, MD, 80 mg at 11/23/22 2159   azithromycin (ZITHROMAX) tablet 500 mg, 500 mg, Oral, Daily, Benita Gutter, RPH, 500 mg at 11/24/22 1031   bisoprolol (ZEBETA) tablet 10 mg, 10 mg, Oral, Daily, Ivor Costa, MD, 10 mg at 11/24/22 1034   cefTRIAXone (ROCEPHIN) 2 g in sodium chloride 0.9 % 100 mL IVPB, 2 g, Intravenous, Q24H, Nicole Kindred A, DO, Stopped at 11/24/22 0551   dextromethorphan-guaiFENesin ([place] DM) 30-600 MG per 12 hr tablet 1 tablet, 1 tablet, Oral, BID PRN, Ivor Costa, MD   diazepam (VALIUM) tablet 2 mg, 2 mg, Oral, QID PRN, Ivor Costa, MD, 2 mg at 11/23/22 1246   enoxaparin (LOVENOX) injection 40 mg, 40  mg, Subcutaneous, Q24H, Ivor Costa, MD, 40 mg at 11/23/22 2157   FLUoxetine (PROZAC) capsule 60 mg, 60 mg, Oral, Daily, Ivor Costa, MD, 60 mg at 11/24/22 4707   hydrALAZINE (APRESOLINE) injection 5 mg, 5 mg, Intravenous, Q2H PRN, Ivor Costa, MD   insulin aspart (novoLOG) injection 0-5 Units, 0-5 Units, Subcutaneous, QHS, Ivor Costa, MD   insulin aspart (novoLOG) injection 0-9 Units, 0-9 Units, Subcutaneous, TID WC, Ivor Costa, MD, 1 Units at 11/24/22 1212   ipratropium-albuterol (DUONEB) 0.5-2.5 (3) MG/3ML nebulizer solution 3 mL, 3 mL, Nebulization, Q4H, Ivor Costa, MD, 3 mL at 11/24/22 1211   levothyroxine (SYNTHROID) tablet 50 mcg, 50 mcg, Oral, Q0600, Ivor Costa, MD, 50 mcg at 11/24/22 0520   LORazepam (ATIVAN) tablet 1 mg, 1 mg, Oral, BID, Ivor Costa, MD, 1 mg at 11/24/22 1031   menthol-cetylpyridinium (CEPACOL) lozenge 3 mg, 1 lozenge, Oral, PRN, Nicole Kindred A, DO   methylphenidate (RITALIN) tablet 10 mg, 10 mg, Oral, BID, Ivor Costa, MD, 10 mg at 11/24/22 1030   methylPREDNISolone sodium succinate (SOLU-MEDROL) 125 mg/2 mL injection 60 mg, 60 mg, Intravenous, Q12H, Griffith, Kelly A, DO   ondansetron  (ZOFRAN) injection 4 mg, 4 mg, Intravenous, Q8H PRN, Ivor Costa, MD   oseltamivir (TAMIFLU) capsule 75 mg, 75 mg, Oral, BID, Benita Gutter, RPH, 75 mg at 11/24/22 1032   pantoprazole (PROTONIX) EC tablet 40 mg, 40 mg, Oral, BID, Ivor Costa, MD, 40 mg at 11/24/22 6151   traMADol (ULTRAM) tablet 50 mg, 50 mg, Oral, Q6H PRN, Ivor Costa, MD  Current Outpatient Medications:    acetaminophen (TYLENOL) 500 MG tablet, Take 2 tablets (1,000 mg total) by mouth 3 (three) times daily., Disp: 30 tablet, Rfl: 0   ascorbic acid (VITAMIN C) 500 MG tablet, Take 500 mg by mouth daily., Disp: , Rfl:    atorvastatin (LIPITOR) 80 MG tablet, Take 80 mg by mouth at bedtime., Disp: , Rfl:    bisoprolol (ZEBETA) 10 MG tablet, Take 10 mg by mouth daily., Disp: , Rfl:    calcium-vitamin D (OSCAL WITH D) 500-200 MG-UNIT tablet, Take 1 tablet by mouth 2 (two) times daily. With meals, Disp: , Rfl:    cyanocobalamin (,VITAMIN B-12,) 1000 MCG/ML injection, Inject 1,000 mcg into the muscle every 30 (thirty) days. , Disp: , Rfl:    diazepam (VALIUM) 2 MG tablet, Take 2 mg by mouth at bedtime as needed for muscle spasms., Disp: , Rfl:    famotidine (PEPCID) 40 MG tablet, Take 40 mg by mouth at bedtime., Disp: , Rfl:    FLUoxetine (PROZAC) 20 MG capsule, Take 60 mg by mouth daily., Disp: , Rfl:    hydrochlorothiazide (HYDRODIURIL) 25 MG tablet, Take 25 mg by mouth in the morning., Disp: , Rfl:    levothyroxine (SYNTHROID, LEVOTHROID) 50 MCG tablet, Take 50 mcg by mouth daily before breakfast., Disp: , Rfl:    LORazepam (ATIVAN) 1 MG tablet, Take 1 mg by mouth 2 (two) times daily. , Disp: , Rfl:    metFORMIN (GLUCOPHAGE-XR) 500 MG 24 hr tablet, Take 1,500 mg by mouth every evening., Disp: , Rfl: 3   methylphenidate (RITALIN) 10 MG tablet, Take 40 mg by mouth daily., Disp: , Rfl:    Multiple Vitamins-Minerals (MULTIVITAMIN WITH MINERALS) tablet, Take 1 tablet by mouth daily., Disp: , Rfl:    ondansetron (ZOFRAN-ODT) 4 MG  disintegrating tablet, Take 4 mg by mouth every 8 (eight) hours as needed for nausea or vomiting., Disp: , Rfl:  pantoprazole (PROTONIX) 40 MG tablet, Take 40 mg by mouth 2 (two) times daily., Disp: , Rfl:    Phendimetrazine Tartrate 35 MG TABS, Take 35 mg by mouth 3 (three) times daily., Disp: , Rfl:    predniSONE (DELTASONE) 10 MG tablet, Take 10 mg by mouth daily., Disp: , Rfl:    Semaglutide,0.25 or 0.5MG/DOS, (OZEMPIC, 0.25 OR 0.5 MG/DOSE,) 2 MG/3ML SOPN, Inject 0.5 mg into the skin every Wednesday., Disp: , Rfl:    traMADol (ULTRAM) 50 MG tablet, Take 50-100 mg by mouth every 8 (eight) hours as needed for moderate pain., Disp: , Rfl:    valsartan-hydrochlorothiazide (DIOVAN-HCT) 320-12.5 MG tablet, Take 1 tablet by mouth at bedtime., Disp: , Rfl:   Review of Systems - Oncology  PHYSICAL EXAM Vitals:   11/24/22 0805 11/24/22 0900 11/24/22 1000 11/24/22 1100  BP: (!) 154/86 (!) 157/100 (!) 131/92 (!) 140/89  Pulse: 91 95 96 93  Resp: (!) 26 (!) 22 (!) 23 (!) 26  Temp: 98.4 F (36.9 C)     TempSrc: Oral     SpO2: 95% 95% 95% 95%  Weight:      Height:       Physical Exam    LABORATORY STUDIES    Latest Ref Rng & Units 11/24/2022    3:57 AM 11/23/2022    4:48 AM 11/22/2022   11:29 AM  CBC  WBC 4.0 - 10.5 K/uL 6.2  2.5  0.8   Hemoglobin 12.0 - 15.0 g/dL 10.9  11.7  13.6   Hematocrit 36.0 - 46.0 % 34.7  36.9  43.3   Platelets 150 - 400 K/uL 176  212  288       Latest Ref Rng & Units 11/24/2022    3:57 AM 11/23/2022    4:48 AM 11/22/2022   11:29 AM  CMP  Glucose 70 - 99 mg/dL 132  187  149   BUN 8 - 23 mg/dL _0 Creatinine 0.44 - 1.00 mg/dL 0.70  0.82  1.41   Sodium 135 - 145 mmol/L 140  141  140   Potassium 3.5 - 5.1 mmol/L 3.2  4.0  4.5   Chloride 98 - 111 mmol/L 107  107  100   CO2 22 - 32 mmol/L _1 Calcium 8.9 - 10.3 mg/dL 7.5  8.0  8.8   Total Protein 6.5 - 8.1 g/dL   6.9   Total Bilirubin 0.3 - 1.2 mg/dL   0.5   Alkaline Phos 38 - 126 U/L    68   AST 15 - 41 U/L   62   ALT 0 - 44 U/L   43      RADIOGRAPHIC STUDIES: I have personally reviewed the radiological images as listed and agreed with the findings in the report. DG Chest Port 1 View  Result Date: 11/22/2022 CLINICAL DATA:  Shortness of breath. EXAM: PORTABLE CHEST 1 VIEW COMPARISON:  AP chest 02/29/2020, chest and left rib radiographs 07/13/2016; CT chest 01/22/2017 FINDINGS: Cardiac silhouette is again mildly enlarged. Mediastinal contours are within normal limits. There is new right lower greater than mid lung and possible minimal left lower lung heterogeneous airspace opacification. No definite pleural effusion. No pneumothorax. 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 IMPRESSION: New right lower greater than mid lung and possible minimal left lower lung heterogeneous airspace opacification. This could be due to pneumonia or asymmetric pulmonary edema.  Electronically Signed   By: Yvonne Kendall M.D.   On: 11/22/2022 11:51     Assessment and plan-   # Acute neutropenia, likely reactive secondary to severe sepsis/influenza Recommend treat underlying infection.  No benefit for G-CSF Count has been monitored daily.  ANC has recovered and neutropenia has completely resolved.  # Severe sepsis/influenza A/Pneumonia /acute respiratory failure, Patient has been treated with Tamiflu and antibiotics. Improving. Defer management to hospitalist   Hematology will sign off given that neutropenia has completely resolved.  Please call if any additional questions. Thank you for allowing me to participate in the care of this patient.   Earlie Server, MD, PhD Hematology Oncology 11/24/2022

## 2022-11-24 NOTE — ED Notes (Signed)
Pt. Sitting up in bed for lunch. Pt. States she is not very hungry, eating fruit. Pt. Denies any further need currently.

## 2022-11-24 NOTE — ED Notes (Signed)
Report given to Rodman Key, RN

## 2022-11-24 NOTE — Progress Notes (Addendum)
Progress Note   Patient: Ashlee Fields:[id number] DOB: 1956/10/14 DOA: 11/22/2022     2 DOS: the patient was seen and examined on 11/24/2022   Brief hospital course: Ashlee Fields is a 67 y.o. female with medical history significant of hypertension, hyperlipidemia, diabetes mellitus, COPD, GERD, hypothyroidism, depression with anxiety, OSA on CPAP, multiple sclerosis with left-sided weakness, pulmonary hypertension, who presented on 11/22/2022 for evaluation of worsening shortness breath, with productive cough x 2 days.  In the ED, patient was noted to have severe respiratory distress, oxygen desaturation to 83% on room air, could not speak in full sentence, initially started 6 L/min oxygen, but due to ongoing respiratory distress was started on BiPAP in ED.   ED course -- Positive flu A PCR, lactic acid 4.5, 3.7, procalcitonin 46.62, BNP 426, troponin level 10, neutropenia with WBC 0.8 (neutrophils 13%, lymphocyte 61%).  Noted prior WBC 11.9 on 09/15/2022.  AKI with creatinine 1.41 (baseline creatinine 0.7 on 09/15/22).  Temperature normal, blood pressure 167/101, heart rate 96, RR 32.  Chest x-ray showed possible right lower lobe and left lower lobe infiltration, concerning for pneumonia.  Patient was admitted to the hospital and started on empiric IV antibiotics for likely bacterial pneumonia secondary to influenza A infection.  12/31: remained on BiPAP with ongoing tachypnea and increased work of breathing this morning.  Tolerated two hour break this AM.  Feels improving overall.   Assessment and Plan: * Influenza A Started on Tamiflu x 5 days Supportive care per orders  Acute on chronic respiratory failure with hypoxia ([place]) Due to Flu A and secondary pneumonia. --BiPAP PRN --Supplement O2, goal spO2 88-95%, wean as tolerated --Pulmonary hygiene --Mgmt of Flu and pneumonia as outlined  Positive blood culture Admission blood culture positive for Strep pneumoniae. --Follow  culture for susceptibilities --Repeat Bcx ordered  Severe sepsis ([place]) POA with WBC 0.8, heart rate 96, RR 32, lactic acid 5.4, 3.7.  Procalcitonin 46.62.  Chest x-ray findings consistent with pneumonia.  Flu A positive. --Continue antibiotics --Follow cultures  COPD exacerbation (HCC) Due to Flu and pneumonia.  Continue IV steroids, neb treatments and supportive care O2 per protocol  AKI (acute kidney injury) ([place]) Resolved. Likely due to dehydration and continuation Diovan -Resume home Diovan -Stop IV fluid  -Monitor BMP  Neutropenia (HCC)-resolved as of 11/24/2022 Likely due to acute infection and myelosuppression. Hematology consulted on admission. WBC trend: 0.8 >> 2.5, no longer neutropenic. Follow CBC  Diabetes mellitus without complication (HCC) Recent A1c 7.1, poorly controlled.   Patient is taking Victoza and metformin -Sliding scale insulin  Hypothyroidism -Synthroid   HTN (hypertension) -IV hydralazine prn -Resume Diovan, held due to AKI and sepsis which have resolved -Continue Zebeta  HLD (hyperlipidemia) -Lipitor   MS (multiple sclerosis) (HCC) Pt is on prednisone 10 mg daily.   Has chronic left-sided weakness -Now on Solu-Medrol IV as above  Depression with anxiety -Continue home medications   Obesity (BMI 30-39.9) Body mass index is 33.66 kg/m. Complicates overall care and prognosis.  Recommend lifestyle modifications including physical activity and diet for weight loss and overall long-term health.   Hypokalemia K 3.2 this AM (1/1) - replacing. Monitor BMP, Mg level        Subjective: Pt seen in ED holding for a bed, husband at bedside.  She reports feeling much better and wants to go home.  Discussed severity of her illness and blood stream infection from severe pneumonia. She becomes agreeable to stay but asks for medication  for her nerves. Reports feeling anxious.  She has wet sounding breathing and cough today.    Physical  Exam: Vitals:   11/24/22 1215 11/24/22 1240 11/24/22 1245 11/24/22 1500  BP:  (!) 153/84  138/73  Pulse: 89 91 88 81  Resp: (!) 26 (!) 24 (!) 23 (!) 24  Temp:    98.7 F (37.1 C)  TempSrc:    Oral  SpO2: 94% 92% 92% 94%  Weight:      Height:       General exam: awake, alert, no acute distress, obese HEENT: moist mucus membranes, hearing grossly normal  Respiratory system: diffuse rhonchi vs upper airway secretion sounds, wet sounding cough, no expiratory wheezes, improved but still mildly increased respiratory effort, tachypneic Cardiovascular system: normal S1/S2, RRR, no pedal edema.   Gastrointestinal system: soft, NT, ND, no HSM felt, +bowel sounds. Central nervous system: A&O x3. no gross focal neurologic deficits, normal speech Extremities: moves all, no edema, normal tone Skin: dry, intact, normal temperature Psychiatry: normal mood, congruent affect, judgement and insight appear normal   Data Reviewed:  Notable labs ---  K 3.2, glucose 132, Ca 7.5, lactic acid normalized 1.2, WBC 6.2 leukopenia resolved, Hbg 10.9   Strep pneumo antigen positive. Blood culture positive for strep pneumo.  Repeat blood culture drawn today - pending  Family Communication: husband at bedside on rounds  Disposition: Status is: Inpatient Remains inpatient appropriate because: remains on IV therapies as outlined    Planned Discharge Destination: Home    Time spent: 40 minutes  Author: Ezekiel Slocumb, DO 11/24/2022 4:43 PM  For on call review [email or web address].

## 2022-11-24 NOTE — ED Notes (Signed)
Lab at bedside

## 2022-11-24 NOTE — Progress Notes (Signed)
RT contacted to place patient on BIPAP. Husband remains at bedside. Denies further needs at this time.

## 2022-11-24 NOTE — ED Notes (Signed)
Called lab for blood culture draw

## 2022-11-24 NOTE — ED Notes (Signed)
ED Provider at bedside. 

## 2022-11-25 DIAGNOSIS — B953 Streptococcus pneumoniae as the cause of diseases classified elsewhere: Secondary | ICD-10-CM

## 2022-11-25 DIAGNOSIS — Z87891 Personal history of nicotine dependence: Secondary | ICD-10-CM

## 2022-11-25 DIAGNOSIS — D649 Anemia, unspecified: Secondary | ICD-10-CM | POA: Diagnosis present

## 2022-11-25 DIAGNOSIS — J9621 Acute and chronic respiratory failure with hypoxia: Secondary | ICD-10-CM | POA: Diagnosis not present

## 2022-11-25 DIAGNOSIS — R7881 Bacteremia: Secondary | ICD-10-CM | POA: Diagnosis not present

## 2022-11-25 DIAGNOSIS — N179 Acute kidney failure, unspecified: Secondary | ICD-10-CM | POA: Diagnosis not present

## 2022-11-25 DIAGNOSIS — D703 Neutropenia due to infection: Secondary | ICD-10-CM

## 2022-11-25 DIAGNOSIS — J101 Influenza due to other identified influenza virus with other respiratory manifestations: Secondary | ICD-10-CM | POA: Diagnosis not present

## 2022-11-25 LAB — CULTURE, BLOOD (ROUTINE X 2): Special Requests: ADEQUATE

## 2022-11-25 LAB — BASIC METABOLIC PANEL
Anion gap: 13 (ref 5–15)
BUN: 24 mg/dL — ABNORMAL HIGH (ref 8–23)
CO2: 23 mmol/L (ref 22–32)
Calcium: 7.6 mg/dL — ABNORMAL LOW (ref 8.9–10.3)
Chloride: 104 mmol/L (ref 98–111)
Creatinine, Ser: 0.78 mg/dL (ref 0.44–1.00)
GFR, Estimated: >60 mL/min (ref >60)
Glucose, Bld: 158 mg/dL — ABNORMAL HIGH (ref 70–99)
Potassium: 3.8 mmol/L (ref 3.5–5.1)
Sodium: 140 mmol/L (ref 135–145)

## 2022-11-25 LAB — CBC
HCT: 29.3 % — ABNORMAL LOW (ref 36.0–46.0)
Hemoglobin: 9.2 g/dL — ABNORMAL LOW (ref 12.0–15.0)
MCH: 29.6 pg (ref 26.0–34.0)
MCHC: 31.4 g/dL (ref 30.0–36.0)
MCV: 94.2 fL (ref 80.0–100.0)
Platelets: 154 10*3/uL (ref 150–400)
RBC: 3.11 MIL/uL — ABNORMAL LOW (ref 3.87–5.11)
RDW: 15.7 % — ABNORMAL HIGH (ref 11.5–15.5)
WBC: 6.8 10*3/uL (ref 4.0–10.5)
nRBC: 0.4 % — ABNORMAL HIGH (ref 0.0–0.2)

## 2022-11-25 LAB — CBG MONITORING, ED: Glucose-Capillary: 156 mg/dL — ABNORMAL HIGH (ref 70–99)

## 2022-11-25 LAB — GLUCOSE, CAPILLARY
Glucose-Capillary: 189 mg/dL — ABNORMAL HIGH (ref 70–99)
Glucose-Capillary: 182 mg/dL — ABNORMAL HIGH (ref 70–99)
Glucose-Capillary: 213 mg/dL — ABNORMAL HIGH (ref 70–99)

## 2022-11-25 LAB — PROCALCITONIN: Procalcitonin: 14.84 ng/mL

## 2022-11-25 MED ORDER — METHYLPREDNISOLONE SODIUM SUCC 40 MG IJ SOLR
40.0000 mg | Freq: Two times a day (BID) | INTRAMUSCULAR | Status: AC
  Administered 2022-11-25 (×2): 40 mg via INTRAVENOUS
  Filled 2022-11-25 (×2): qty 1

## 2022-11-25 MED ORDER — PREDNISONE 20 MG PO TABS
40.0000 mg | ORAL_TABLET | Freq: Every day | ORAL | Status: DC
  Administered 2022-11-26 – 2022-11-28 (×3): 40 mg via ORAL
  Filled 2022-11-25 (×3): qty 2

## 2022-11-25 MED ORDER — DM-GUAIFENESIN ER 30-600 MG PO TB12
1.0000 | ORAL_TABLET | Freq: Two times a day (BID) | ORAL | Status: DC
  Administered 2022-11-25 – 2022-11-28 (×7): 1 via ORAL
  Filled 2022-11-25 (×7): qty 1

## 2022-11-25 NOTE — Progress Notes (Addendum)
Progress Note   Patient: Ashlee Fields FXT:[id number] DOB: 06/30/1956 DOA: 11/22/2022     3 DOS: the patient was seen and examined on 11/25/2022   Brief hospital course: Ashlee Fields is a 67 y.o. female with medical history significant of hypertension, hyperlipidemia, diabetes mellitus, COPD, GERD, hypothyroidism, depression with anxiety, OSA on CPAP, multiple sclerosis with left-sided weakness, pulmonary hypertension, who presented on 11/22/2022 for evaluation of worsening shortness breath, with productive cough x 2 days.  In the ED, patient was noted to have severe respiratory distress, oxygen desaturation to 83% on room air, could not speak in full sentence, initially started 6 L/min oxygen, but due to ongoing respiratory distress was started on BiPAP in ED.   ED course -- Positive flu A PCR, lactic acid 4.5, 3.7, procalcitonin 46.62, BNP 426, troponin level 10, neutropenia with WBC 0.8 (neutrophils 13%, lymphocyte 61%).  Noted prior WBC 11.9 on 09/15/2022.  AKI with creatinine 1.41 (baseline creatinine 0.7 on 09/15/22).  Temperature normal, blood pressure 167/101, heart rate 96, RR 32.  Chest x-ray showed possible right lower lobe and left lower lobe infiltration, concerning for pneumonia.  Patient was admitted to the hospital and started on empiric IV antibiotics for likely bacterial pneumonia secondary to influenza A infection.  12/31: remained on BiPAP with ongoing tachypnea and increased work of breathing this morning.  Tolerated two hour break this AM.  Feels improving overall.   1/1--1/2: pt improving slowly, still on oxygen. Positive blood culture with Strep pneumo, ID consulted.  Assessment and Plan: * Influenza A Started on Tamiflu x 5 days Supportive care per orders  Acute on chronic respiratory failure with hypoxia ([place]) Due to Flu A and secondary pneumonia. --BiPAP PRN --Supplement O2, goal spO2 88-95%, wean as tolerated --Pulmonary hygiene --Mgmt of Flu and pneumonia  as outlined  Bacteremia due to Streptococcus pneumoniae Admission blood culture positive for Strep pneumoniae. --Follow culture for susceptibilities --Repeat Bcx ordered --Infectious disease consulted  Severe sepsis ([place]) POA with WBC 0.8, heart rate 96, RR 32, lactic acid 5.4, 3.7.  Procalcitonin 46.62.  Chest x-ray findings consistent with pneumonia.  Flu A positive. --Continue antibiotics --Follow cultures  COPD exacerbation (HCC) Due to Flu and pneumonia.  Transition from IV to PO steroids tomorrow Continue neb treatments, supportive care O2 per protocol  AKI (acute kidney injury) ([place]) Resolved. Likely due to dehydration and continuation Diovan -Resume home Diovan -Stop IV fluid  -Monitor BMP  Neutropenia (HCC)-resolved as of 11/24/2022 Likely due to acute infection and myelosuppression. Hematology consulted on admission. WBC trend: 0.8 >> 2.5, no longer neutropenic. Follow CBC  Diabetes mellitus without complication (HCC) Recent A1c 7.1, poorly controlled.   Patient is taking Victoza and metformin -Sliding scale insulin  Hypothyroidism -Synthroid   HTN (hypertension) -IV hydralazine prn -Resume Diovan, held due to AKI and sepsis which have resolved -Continue Zebeta  HLD (hyperlipidemia) -Lipitor   MS (multiple sclerosis) (HCC) Minimal mobility at baseline. She stands and transfers okay at home when healthy. Pt is on prednisone 10 mg daily.   Has chronic left-sided weakness -Now higher dose steroids as above  Depression with anxiety -Continue home medications   Obesity (BMI 30-39.9) Body mass index is 33.66 kg/m. Complicates overall care and prognosis.  Recommend lifestyle modifications including physical activity and diet for weight loss and overall long-term health.   Normocytic anemia Hbg trending down slightly past couple of days: 11.7 >> 10.9 >> 9.2.  Per chart review was B12 deficient last year.  No  evidence of bleeding. --Anemia panel with AM  labs --Monitor CBC  Hypokalemia Replaced on 11/24/22 for K 3.2  Monitor BMP, Mg level        Subjective: Pt seen with husband at bedside.  She reports feeling better, anxious to go home.  Has ongoing cough but states breathing has improved. Tolerating bipap at night.  Has old CPAP machine at home but has not used in some time.  Physical Exam: Vitals:   11/25/22 0800 11/25/22 1000 11/25/22 1153 11/25/22 1321  BP: (!) 141/76 (!) 158/99 (!) 168/93   Pulse: 74 86 82   Resp: (!) 21 (!) 22 19   Temp:  98.3 F (36.8 C) 98.5 F (36.9 C)   TempSrc:  Oral Oral   SpO2:  96% 98% 98%  Weight:      Height:       General exam: awake, alert, no acute distress, obese HEENT: moist mucus membranes, hearing grossly normal  Respiratory system: CTAB with diminished bases, no expiratory wheezes, intermittent referred upper airway sound, on 3 L/min  o2 Cardiovascular system: normal S1/S2, RRR, no pedal edema.   Gastrointestinal system: soft, NT, ND, no HSM felt, +bowel sounds. Central nervous system: A&O x3. Normal speech Extremities: moves all, no edema, normal tone Skin: dry, intact, normal temperature Psychiatry: normal mood, congruent affect, judgement and insight appear normal   Data Reviewed:  Notable labs --- glucose 158, BUN 24, Ca 7.6, Hbg 9.2 << 10.0 << 11.7    Strep pneumo antigen positive. Blood culture positive for strep pneumo, pan-sensitive.  Repeat blood culture 1/1 - neg to date  Family Communication: husband at bedside on rounds  Disposition: Status is: Inpatient Remains inpatient appropriate because: remains on IV therapies as outlined with ongoing evaluation.    Planned Discharge Destination: Home    Time spent: 40 minutes  Author: Ezekiel Slocumb, DO 11/25/2022 4:31 PM  For on call review [email or web address].

## 2022-11-25 NOTE — Consult Note (Signed)
NAME: Ashlee Fields  DOB: Nov 21, 1956  MRN: [id number]  Date/Time: 11/25/2022 3:20 PM  REQUESTING PROVIDER; Dr.Griffith REASON FOR CONSULT: strep pneumo bacteremia and influenza A ?Husband at bed side  Ashlee Fields is a 67 y.o. female with a history of HTN, HLD, DM, COPD, Hypothyroidism.B/l TKA  Presents with Sob of 2 days duration along with cough. No fever In the ED vitals 152/115, Temp 97.8, HR 94. Oxygen stas were 85% and improved with oxygen Lactate was 4.5 Blood culture sent She was also Neutropenic on presentation with WBC 0.8 CXR revealed RT LL and left upper lobe infiltrate Influenza A was positive She was started on Tamiflu, ceftriaxone and azithromycin I am seeing the patient as the blood culture is positive for pneumococcus Pt is  sob and wheezing when I went to see her Husband at bed side  Past Medical History:  Diagnosis Date   Anxiety    Arthritis    Depression    Diabetes mellitus without complication (HCC)    GERD (gastroesophageal reflux disease)    Hypertension    Hypothyroidism    Multiple sclerosis ([place]) 1980   Neutropenia ([place]) 11/22/2022   Osteoporosis    Sleep apnea    Vision abnormalities    Urge incontinence has received intravesical botox injections- last one 10/13/22   Past Surgical History:  Procedure Laterality Date   CESAREAN SECTION     COLONOSCOPY     ESOPHAGOGASTRODUODENOSCOPY (EGD) WITH PROPOFOL N/A 11/20/2020   Procedure: ESOPHAGOGASTRODUODENOSCOPY (EGD) WITH PROPOFOL;  Surgeon: [name], MD;  Location: [facility];  Service: Endoscopy;  Laterality: N/A;   JOINT REPLACEMENT     REPLACEMENT TOTAL KNEE Left 2009   RIGHT HEART CATH Right 08/05/2017   Procedure: RIGHT HEART CATH;  Surgeon: Teodoro Spray, MD;  Location: [facility];  Service: Cardiovascular;  Laterality: Right;   TOTAL KNEE ARTHROPLASTY Right 02/28/2020   Procedure: TOTAL KNEE ARTHROPLASTY;  Surgeon: Thornton Park, MD;  Location: [facility];   Service: Orthopedics;  Laterality: Right;    Social History   Socioeconomic History   Marital status: Married    Spouse name: Not on file   Number of children: Not on file   Years of education: Not on file   Highest education level: Not on file  Occupational History   Not on file  Tobacco Use   Smoking status: Former    Packs/day: 0.00    Types: Cigarettes    Quit date: 2018    Years since quitting: 6.0    Passive exposure: Past   Smokeless tobacco: Never  Vaping Use   Vaping Use: Never used  Substance and Sexual Activity   Alcohol use: Never   Drug use: Never   Sexual activity: Not on file  Other Topics Concern   Not on file  Social History Narrative   Not on file   Social Determinants of Health   Financial Resource Strain: Not on file  Food Insecurity: No Food Insecurity (11/24/2022)   Hunger Vital Sign    Worried About Running Out of Food in the Last Year: Never true    Ran Out of Food in the Last Year: Never true  Transportation Needs: No Transportation Needs (11/24/2022)   PRAPARE - Hydrologist (Medical): No    Lack of Transportation (Non-Medical): No  Physical Activity: Not on file  Stress: Not on file  Social Connections: Not on file  Intimate Partner Violence: Not At Risk (  11/24/2022)   Humiliation, Afraid, Rape, and Kick questionnaire    Fear of Current or Ex-Partner: No    Emotionally Abused: No    Physically Abused: No    Sexually Abused: No    Family History  Problem Relation Age of Onset   Breast cancer Paternal Grandmother    Heart disease Mother    Kidney failure Father    Colon cancer Maternal Grandmother    Allergies  Allergen Reactions   Penicillins Hives    Did it involve swelling of the face/tongue/throat, SOB, or low BP? No Did it involve sudden or severe rash/hives, skin peeling, or any reaction on the inside of your mouth or nose? Yes Did you need to seek medical attention at a hospital or doctor's office?  Unknown When did it last happen?      20 + years If all above answers are "NO", may proceed with cephalosporin use.     Other Hives    succinylsulfathiazole   Ibuprofen Rash   Sulfa Antibiotics Hives, Nausea And Vomiting, Itching and Rash   I? Current Facility-Administered Medications  Medication Dose Route Frequency Provider Last Rate Last Admin   acetaminophen (TYLENOL) tablet 650 mg  650 mg Oral Q6H PRN Ivor Costa, MD   650 mg at 11/24/22 1040   albuterol (PROVENTIL) (2.5 MG/3ML) 0.083% nebulizer solution 2.5 mg  2.5 mg Nebulization Q4H PRN Ivor Costa, MD       atorvastatin (LIPITOR) tablet 80 mg  80 mg Oral QHS Ivor Costa, MD   80 mg at 11/24/22 2141   azithromycin (ZITHROMAX) tablet 500 mg  500 mg Oral Daily Benita Gutter, RPH   500 mg at 11/25/22 3244   bisoprolol (ZEBETA) tablet 10 mg  10 mg Oral Daily Ivor Costa, MD   10 mg at 11/25/22 1005   cefTRIAXone (ROCEPHIN) 2 g in sodium chloride 0.9 % 100 mL IVPB  2 g Intravenous Q24H Nicole Kindred A, DO   Stopped at 11/25/22 0102   dextromethorphan-guaiFENesin ([place] DM) 30-600 MG per 12 hr tablet 1 tablet  1 tablet Oral BID Nicole Kindred A, DO   1 tablet at 11/25/22 7253   diazepam (VALIUM) tablet 2 mg  2 mg Oral QID PRN Ivor Costa, MD   2 mg at 11/23/22 1246   enoxaparin (LOVENOX) injection 40 mg  40 mg Subcutaneous Q24H Ivor Costa, MD   40 mg at 11/24/22 2141   FLUoxetine (PROZAC) capsule 60 mg  60 mg Oral Daily Ivor Costa, MD   60 mg at 11/25/22 6644   hydrALAZINE (APRESOLINE) injection 5 mg  5 mg Intravenous Q2H PRN Ivor Costa, MD       irbesartan (AVAPRO) tablet 300 mg  300 mg Oral QHS Alison Murray, RPH   300 mg at 11/24/22 2142   And   hydrochlorothiazide (HYDRODIURIL) tablet 12.5 mg  12.5 mg Oral QHS Alison Murray, RPH   12.5 mg at 11/24/22 2141   insulin aspart (novoLOG) injection 0-5 Units  0-5 Units Subcutaneous QHS Ivor Costa, MD       insulin aspart (novoLOG) injection 0-9 Units  0-9 Units Subcutaneous TID WC  Ivor Costa, MD   2 Units at 11/25/22 1202   ipratropium-albuterol (DUONEB) 0.5-2.5 (3) MG/3ML nebulizer solution 3 mL  3 mL Nebulization Q6H Nicole Kindred A, DO   3 mL at 11/25/22 1319   levothyroxine (SYNTHROID) tablet 50 mcg  50 mcg Oral Q0600 Ivor Costa, MD   50 mcg at 11/25/22  6283   LORazepam (ATIVAN) tablet 1 mg  1 mg Oral BID Ivor Costa, MD   1 mg at 11/25/22 6629   menthol-cetylpyridinium (CEPACOL) lozenge 3 mg  1 lozenge Oral PRN Nicole Kindred A, DO       methylphenidate (RITALIN) tablet 10 mg  10 mg Oral BID Ivor Costa, MD   10 mg at 11/25/22 1349   methylPREDNISolone sodium succinate (SOLU-MEDROL) 40 mg/mL injection 40 mg  40 mg Intravenous Q12H Nicole Kindred A, DO   40 mg at 11/25/22 0957   ondansetron (ZOFRAN) injection 4 mg  4 mg Intravenous Q8H PRN Ivor Costa, MD       oseltamivir (TAMIFLU) capsule 75 mg  75 mg Oral BID Benita Gutter, RPH   75 mg at 11/25/22 0956   pantoprazole (PROTONIX) EC tablet 40 mg  40 mg Oral BID Ivor Costa, MD   40 mg at 11/25/22 0959   traMADol (ULTRAM) tablet 50 mg  50 mg Oral Q6H PRN Ivor Costa, MD         Abtx:  Anti-infectives (From admission, onward)    Start     Dose/Rate Route Frequency Ordered Stop   11/24/22 1000  azithromycin (ZITHROMAX) tablet 500 mg        500 mg Oral Daily 11/24/22 0935 11/27/22 0959   11/23/22 1300  azithromycin (ZITHROMAX) 500 mg in sodium chloride 0.9 % 250 mL IVPB  Status:  Discontinued        500 mg 250 mL/hr over 60 Minutes Intravenous Every 24 hours 11/22/22 1425 11/23/22 0314   11/23/22 1300  azithromycin (ZITHROMAX) 500 mg in sodium chloride 0.9 % 250 mL IVPB  Status:  Discontinued        500 mg 250 mL/hr over 60 Minutes Intravenous Every 24 hours 11/23/22 0314 11/24/22 0935   11/23/22 1200  cefTRIAXone (ROCEPHIN) 1 g in sodium chloride 0.9 % 100 mL IVPB  Status:  Discontinued        1 g 200 mL/hr over 30 Minutes Intravenous Every 24 hours 11/22/22 1425 11/23/22 0314   11/23/22 1000  oseltamivir  (TAMIFLU) capsule 75 mg        75 mg Oral 2 times daily 11/23/22 0809 11/27/22 0959   11/23/22 0600  cefTRIAXone (ROCEPHIN) 2 g in sodium chloride 0.9 % 100 mL IVPB        2 g 200 mL/hr over 30 Minutes Intravenous Every 24 hours 11/23/22 0314 11/26/22 2359   11/22/22 2200  oseltamivir (TAMIFLU) capsule 30 mg  Status:  Discontinued        30 mg Oral 2 times daily 11/22/22 1425 11/22/22 1437   11/22/22 2200  oseltamivir (TAMIFLU) capsule 30 mg  Status:  Discontinued        30 mg Oral 2 times daily 11/22/22 1437 11/23/22 0809   11/22/22 1445  oseltamivir (TAMIFLU) capsule 75 mg        75 mg Oral  Once 11/22/22 1436 11/22/22 1543   11/22/22 1215  azithromycin (ZITHROMAX) 500 mg in sodium chloride 0.9 % 250 mL IVPB        500 mg 250 mL/hr over 60 Minutes Intravenous  Once 11/22/22 1201 11/22/22 1403   11/22/22 1215  cefTRIAXone (ROCEPHIN) 1 g in sodium chloride 0.9 % 100 mL IVPB        1 g 200 mL/hr over 30 Minutes Intravenous  Once 11/22/22 1201 11/22/22 1335       REVIEW OF SYSTEMS:  Const: negative fever, negative  chills, negative weight loss Eyes: negative diplopia or visual changes, negative eye pain ENT: negative coryza, negative sore throat Resp:  cough, dyspnea, productive sputum Cards: negative for chest pain, palpitations, lower extremity edema GU: negative for frequency, dysuria and hematuria GI: Negative for abdominal pain, diarrhea, bleeding, constipation Skin: negative for rash and pruritus Heme: negative for easy bruising and gum/nose bleeding MS: general weakness Neurolo:negative for headaches, dizziness, vertigo, memory problems  Psych: negative for feelings of anxiety, depression  Endocrine:  hypothyroid, Allergy/Immunology-Penicillin- HIVES Bactrim Ibuprofen Objective:  VITALS:  BP (!) 168/93 (BP Location: Left Arm)   Pulse 82   Temp 98.5 F (36.9 C) (Oral)   Resp 19   Ht _0  (1.6 m)   Wt 86.2 kg   SpO2 98%   BMI 33.66 kg/m   PHYSICAL EXAM:   General: Awake , cooperative, resp distress, increased BMI  Head: Normocephalic, without obvious abnormality, atraumatic. Eyes: Conjunctivae clear, anicteric sclerae. Pupils are equal Neck:  symmetrical, no adenopathy, thyroid: non tender no carotid bruit and no JVD. Lungs: b/l rhonchi and basal crepts Heart: Tachycardia Abdomen: Soft, non-tender,not distended. Bowel sounds normal. No masses Extremities: atraumatic, no cyanosis. No edema. No clubbing Skin: No rashes or lesions. Or bruising Lymph: Cervical, supraclavicular normal. Neurologic: Grossly non-focal Pertinent Labs Lab Results CBC    Component Value Date/Time   WBC 6.8 11/25/2022 0502   RBC 3.11 (L) 11/25/2022 0502   HGB 9.2 (L) 11/25/2022 0502   HGB 13.3 10/07/2016 1657   HCT 29.3 (L) 11/25/2022 0502   HCT 38.8 10/07/2016 1657   PLT 154 11/25/2022 0502   PLT 340 10/07/2016 1657   MCV 94.2 11/25/2022 0502   MCV 89 10/07/2016 1657   MCH 29.6 11/25/2022 0502   MCHC 31.4 11/25/2022 0502   RDW 15.7 (H) 11/25/2022 0502   RDW 16.0 (H) 10/07/2016 1657   LYMPHSABS 0.2 (L) 11/23/2022 0448   LYMPHSABS 1.1 10/07/2016 1657   MONOABS 0.0 (L) 11/23/2022 0448   EOSABS 0.0 11/23/2022 0448   EOSABS 0.0 10/07/2016 1657   BASOSABS 0.1 11/23/2022 0448   BASOSABS 0.0 10/07/2016 1657       Latest Ref Rng & Units 11/25/2022    5:02 AM 11/24/2022    3:57 AM 11/23/2022    4:48 AM  CMP  Glucose 70 - 99 mg/dL 158  132  187   BUN 8 - 23 mg/dL _1 Creatinine 0.44 - 1.00 mg/dL 0.78  0.70  0.82   Sodium 135 - 145 mmol/L 140  140  141   Potassium 3.5 - 5.1 mmol/L 3.8  3.2  4.0   Chloride 98 - 111 mmol/L 104  107  107   CO2 22 - 32 mmol/L _2 Calcium 8.9 - 10.3 mg/dL 7.6  7.5  8.0       Microbiology: Recent Results (from the past 240 hour(s))  Resp panel by RT-PCR (RSV, Flu A&B, Covid) Anterior Nasal Swab     Status: Abnormal   Collection Time: 11/22/22 11:29 AM   Specimen: Anterior Nasal Swab  Result Value Ref  Range Status   SARS Coronavirus 2 by RT PCR NEGATIVE NEGATIVE Final    Comment: (NOTE) SARS-CoV-2 target nucleic acids are NOT DETECTED.  The SARS-CoV-2 RNA is generally detectable in upper respiratory specimens during the acute phase of infection. The lowest concentration of SARS-CoV-2 viral copies this assay can detect is 138 copies/mL. A negative result does not  preclude SARS-Cov-2 infection and should not be used as the sole basis for treatment or other patient management decisions. A negative result may occur with  improper specimen collection/handling, submission of specimen other than nasopharyngeal swab, presence of viral mutation(s) within the areas targeted by this assay, and inadequate number of viral copies(<138 copies/mL). A negative result must be combined with clinical observations, patient history, and epidemiological information. The expected result is Negative.  Fact Sheet for Patients:  EntrepreneurPulse.com.au  Fact Sheet for Healthcare Providers:  IncredibleEmployment.be  This test is no t yet approved or cleared by the [place] FDA and  has been authorized for detection and/or diagnosis of SARS-CoV-2 by FDA under an Emergency Use Authorization (EUA). This EUA will remain  in effect (meaning this test can be used) for the duration of the COVID-19 declaration under Section 564(b)(1) of the Act, 21 U.S.C.section 360bbb-3(b)(1), unless the authorization is terminated  or revoked sooner.       Influenza A by PCR POSITIVE (A) NEGATIVE Final   Influenza B by PCR NEGATIVE NEGATIVE Final    Comment: (NOTE) The Xpert Xpress SARS-CoV-2/FLU/RSV plus assay is intended as an aid in the diagnosis of influenza from Nasopharyngeal swab specimens and should not be used as a sole basis for treatment. Nasal washings and aspirates are unacceptable for Xpert Xpress SARS-CoV-2/FLU/RSV testing.  Fact Sheet for  Patients: EntrepreneurPulse.com.au  Fact Sheet for Healthcare Providers: IncredibleEmployment.be  This test is not yet approved or cleared by the [place] FDA and has been authorized for detection and/or diagnosis of SARS-CoV-2 by FDA under an Emergency Use Authorization (EUA). This EUA will remain in effect (meaning this test can be used) for the duration of the COVID-19 declaration under Section 564(b)(1) of the Act, 21 U.S.C. section 360bbb-3(b)(1), unless the authorization is terminated or revoked.     Resp Syncytial Virus by PCR NEGATIVE NEGATIVE Final    Comment: (NOTE) Fact Sheet for Patients: EntrepreneurPulse.com.au  Fact Sheet for Healthcare Providers: IncredibleEmployment.be  This test is not yet approved or cleared by the [place] FDA and has been authorized for detection and/or diagnosis of SARS-CoV-2 by FDA under an Emergency Use Authorization (EUA). This EUA will remain in effect (meaning this test can be used) for the duration of the COVID-19 declaration under Section 564(b)(1) of the Act, 21 U.S.C. section 360bbb-3(b)(1), unless the authorization is terminated or revoked.  Performed at [facility], [address] [place], [place] [address]   Culture, blood (routine x 2)     Status: None (Preliminary result)   Collection Time: 11/22/22  1:13 PM   Specimen: BLOOD  Result Value Ref Range Status   Specimen Description BLOOD BLOOD RIGHT HAND  Final   Special Requests   Final    BOTTLES DRAWN AEROBIC AND ANAEROBIC Blood Culture adequate volume   Culture   Final    NO GROWTH 3 DAYS Performed at [facility], [address], [place], [place] [address]    Report Status PENDING  Incomplete  Culture, blood (routine x 2)     Status: Abnormal   Collection Time: 11/22/22  1:13 PM   Specimen: BLOOD  Result Value Ref Range Status   Specimen Description   Final     BLOOD RIGHT ANTECUBITAL Performed at [facility], [address], [place], [place] [address]    Special Requests   Final    BOTTLES DRAWN AEROBIC AND ANAEROBIC Blood Culture adequate volume Performed at [facility], [address] [place], [place] [address]    Culture  Setup Time   Final    GRAM POSITIVE COCCI IN BOTH AEROBIC AND ANAEROBIC BOTTLES CRITICAL RESULT CALLED TO, READ BACK BY AND VERIFIED WITH: NATHAN BELUE @ 0302 11/23/22 LFD Performed at [facility], [place] [address] [place], [place] [address]    Culture STREPTOCOCCUS PNEUMONIAE (A)  Final   Report Status 11/25/2022 FINAL  Final   Organism ID, Bacteria STREPTOCOCCUS PNEUMONIAE  Final      Susceptibility   Streptococcus pneumoniae - MIC*    ERYTHROMYCIN <=0.12 SENSITIVE Sensitive     LEVOFLOXACIN 0.5 SENSITIVE Sensitive     VANCOMYCIN <=0.12 SENSITIVE Sensitive     PENICILLIN (meningitis) <=0.06 SENSITIVE Sensitive     PENO - penicillin <=0.06      PENICILLIN (non-meningitis) <=0.06 SENSITIVE Sensitive     PENICILLIN (oral) <=0.06 SENSITIVE Sensitive     CEFTRIAXONE (non-meningitis) <=0.12 SENSITIVE Sensitive     CEFTRIAXONE (meningitis) <=0.12 SENSITIVE Sensitive     * STREPTOCOCCUS PNEUMONIAE  Blood Culture ID Panel (Reflexed)     Status: Abnormal   Collection Time: 11/22/22  1:13 PM  Result Value Ref Range Status   Enterococcus faecalis NOT DETECTED NOT DETECTED Final   Enterococcus Faecium NOT DETECTED NOT DETECTED Final   Listeria monocytogenes NOT DETECTED NOT DETECTED Final   Staphylococcus species NOT DETECTED NOT DETECTED Final   Staphylococcus aureus (BCID) NOT DETECTED NOT DETECTED Final   Staphylococcus epidermidis NOT DETECTED NOT DETECTED Final   Staphylococcus lugdunensis NOT DETECTED NOT DETECTED Final   Streptococcus species DETECTED (A) NOT DETECTED Final    Comment: CRITICAL RESULT CALLED TO, READ BACK BY AND VERIFIED WITH: NATHAN BELUE @ 0302 11/23/22 LFD     Streptococcus agalactiae NOT DETECTED NOT DETECTED Final   Streptococcus pneumoniae DETECTED (A) NOT DETECTED Final    Comment: CRITICAL RESULT CALLED TO, READ BACK BY AND VERIFIED WITH: NATHAN BELUE@ 0302 11/23/22 LFD    Streptococcus pyogenes NOT DETECTED NOT DETECTED Final   A.calcoaceticus-baumannii NOT DETECTED NOT DETECTED Final   Bacteroides fragilis NOT DETECTED NOT DETECTED Final   Enterobacterales NOT DETECTED NOT DETECTED Final   Enterobacter cloacae complex NOT DETECTED NOT DETECTED Final   Escherichia coli NOT DETECTED NOT DETECTED Final   Klebsiella aerogenes NOT DETECTED NOT DETECTED Final   Klebsiella oxytoca NOT DETECTED NOT DETECTED Final   Klebsiella pneumoniae NOT DETECTED NOT DETECTED Final   Proteus species NOT DETECTED NOT DETECTED Final   Salmonella species NOT DETECTED NOT DETECTED Final   Serratia marcescens NOT DETECTED NOT DETECTED Final   Haemophilus influenzae NOT DETECTED NOT DETECTED Final   Neisseria meningitidis NOT DETECTED NOT DETECTED Final   Pseudomonas aeruginosa NOT DETECTED NOT DETECTED Final   Stenotrophomonas maltophilia NOT DETECTED NOT DETECTED Final   Candida albicans NOT DETECTED NOT DETECTED Final   Candida auris NOT DETECTED NOT DETECTED Final   Candida glabrata NOT DETECTED NOT DETECTED Final   Candida krusei NOT DETECTED NOT DETECTED Final   Candida parapsilosis NOT DETECTED NOT DETECTED Final   Candida tropicalis NOT DETECTED NOT DETECTED Final   Cryptococcus neoformans/gattii NOT DETECTED NOT DETECTED Final    Comment: Performed at [facility], [address] [place], [place] [address]  Culture, blood (Routine X 2) w Reflex to ID Panel     Status: None (Preliminary result)   Collection Time: 11/24/22  1:03 PM   Specimen: BLOOD  Result Value Ref Range Status   Specimen Description  BLOOD RIGHT HAND  Final   Special Requests   Final    BOTTLES DRAWN AEROBIC AND ANAEROBIC Blood Culture adequate volume   Culture   Final     NO GROWTH < 24 HOURS Performed at [facility], [address] [place], [place] [address]    Report Status PENDING  Incomplete  Culture, blood (Routine X 2) w Reflex to ID Panel     Status: None (Preliminary result)   Collection Time: 11/24/22  1:03 PM   Specimen: BLOOD  Result Value Ref Range Status   Specimen Description BLOOD LEFT HAND  Final   Special Requests   Final    BOTTLES DRAWN AEROBIC AND ANAEROBIC Blood Culture adequate volume   Culture   Final    NO GROWTH < 24 HOURS Performed at [facility], [address] [place], [place] [address]    Report Status PENDING  Incomplete    IMAGING RESULTS:  I have personally reviewed the films ?b/l infiltrate  Impression/Recommendation Acute hypoxic resp failure Multifocal pneumonia due to pneumococcus and Influenza A Pneumococcus bacteremia Acute influenza A respiratory illness Continue tamiflu and ceftriaxone Dc azithromycin 2 d echo  Repeat blood culture NG?  Neutropenia due to acute sepsis on admission resolved  AKI on admission resolved  COPD  B/l TKA  H/o urge incontinence - intravesical botox injections in Nov 2023 ? ___________________________________________________ Discussed with patient, her husband and requesting provider Note:  This document was prepared using Dragon voice recognition software and may include unintentional dictation errors.

## 2022-11-25 NOTE — Assessment & Plan Note (Signed)
Hbg trending down slightly past couple of days: 11.7 >> 10.9 >> 9.2.  Per chart review was B12 deficient last year.  No evidence of bleeding. --Anemia panel with AM labs --Monitor CBC

## 2022-11-26 ENCOUNTER — Inpatient Hospital Stay (HOSPITAL_COMMUNITY)
Admit: 2022-11-26 | Discharge: 2022-11-26 | Disposition: A | Discharge status: Home / Self Care | Payer: Medicare HMO | Attending: Infectious Diseases | Admitting: Infectious Diseases

## 2022-11-26 DIAGNOSIS — R7881 Bacteremia: Secondary | ICD-10-CM

## 2022-11-26 DIAGNOSIS — J441 Chronic obstructive pulmonary disease with (acute) exacerbation: Secondary | ICD-10-CM | POA: Diagnosis not present

## 2022-11-26 DIAGNOSIS — J101 Influenza due to other identified influenza virus with other respiratory manifestations: Secondary | ICD-10-CM | POA: Diagnosis not present

## 2022-11-26 DIAGNOSIS — D709 Neutropenia, unspecified: Secondary | ICD-10-CM | POA: Diagnosis not present

## 2022-11-26 DIAGNOSIS — F419 Anxiety disorder, unspecified: Secondary | ICD-10-CM | POA: Diagnosis not present

## 2022-11-26 DIAGNOSIS — B953 Streptococcus pneumoniae as the cause of diseases classified elsewhere: Secondary | ICD-10-CM | POA: Diagnosis not present

## 2022-11-26 DIAGNOSIS — J9601 Acute respiratory failure with hypoxia: Secondary | ICD-10-CM | POA: Diagnosis not present

## 2022-11-26 DIAGNOSIS — D703 Neutropenia due to infection: Secondary | ICD-10-CM

## 2022-11-26 LAB — BASIC METABOLIC PANEL
Anion gap: 11 (ref 5–15)
BUN: 22 mg/dL (ref 8–23)
CO2: 25 mmol/L (ref 22–32)
Calcium: 7.6 mg/dL — ABNORMAL LOW (ref 8.9–10.3)
Chloride: 104 mmol/L (ref 98–111)
Creatinine, Ser: 0.67 mg/dL (ref 0.44–1.00)
GFR, Estimated: >60 mL/min (ref >60)
Glucose, Bld: 190 mg/dL — ABNORMAL HIGH (ref 70–99)
Potassium: 3.9 mmol/L (ref 3.5–5.1)
Sodium: 140 mmol/L (ref 135–145)

## 2022-11-26 LAB — LEGIONELLA PNEUMOPHILA SEROGP 1 UR AG: L. pneumophila Serogp 1 Ur Ag: NEGATIVE

## 2022-11-26 LAB — ECHOCARDIOGRAM COMPLETE
AR max vel: 2.32 cm2
AV Area VTI: 2.89 cm2
AV Area mean vel: 2.73 cm2
AV Mean grad: 3 mmHg
AV Peak grad: 5.9 mmHg
Ao pk vel: 1.21 m/s
Area-P 1/2: 5.16 cm2
Height: 63 in
MV M vel: 6.52 m/s
MV Peak grad: 170 mmHg
MV VTI: 2.28 cm2
Radius: 0.4 cm
S' Lateral: 3.1 cm
Weight: 3040 oz

## 2022-11-26 LAB — CBC
HCT: 30.3 % — ABNORMAL LOW (ref 36.0–46.0)
Hemoglobin: 9.6 g/dL — ABNORMAL LOW (ref 12.0–15.0)
MCH: 29.5 pg (ref 26.0–34.0)
MCHC: 31.7 g/dL (ref 30.0–36.0)
MCV: 93.2 fL (ref 80.0–100.0)
Platelets: 167 10*3/uL (ref 150–400)
RBC: 3.25 MIL/uL — ABNORMAL LOW (ref 3.87–5.11)
RDW: 15.6 % — ABNORMAL HIGH (ref 11.5–15.5)
WBC: 7.1 10*3/uL (ref 4.0–10.5)
nRBC: 0.3 % — ABNORMAL HIGH (ref 0.0–0.2)

## 2022-11-26 LAB — IRON AND TIBC
Iron: 29 ug/dL (ref 28–170)
Saturation Ratios: 10 % — ABNORMAL LOW (ref 10.4–31.8)
TIBC: 287 ug/dL (ref 250–450)
UIBC: 258 ug/dL

## 2022-11-26 LAB — FOLATE: Folate: 28 ng/mL (ref >5.9)

## 2022-11-26 LAB — GLUCOSE, CAPILLARY
Glucose-Capillary: 173 mg/dL — ABNORMAL HIGH (ref 70–99)
Glucose-Capillary: 215 mg/dL — ABNORMAL HIGH (ref 70–99)
Glucose-Capillary: 232 mg/dL — ABNORMAL HIGH (ref 70–99)
Glucose-Capillary: 168 mg/dL — ABNORMAL HIGH (ref 70–99)

## 2022-11-26 LAB — FERRITIN: Ferritin: 188 ng/mL (ref 11–307)

## 2022-11-26 LAB — RETICULOCYTES
Immature Retic Fract: 6.9 % (ref 2.3–15.9)
RBC.: 3.27 MIL/uL — ABNORMAL LOW (ref 3.87–5.11)
Retic Count, Absolute: 15 10*3/uL — ABNORMAL LOW (ref 19.0–186.0)
Retic Ct Pct: 0.5 % (ref 0.4–3.1)

## 2022-11-26 LAB — VITAMIN B12: Vitamin B-12: 2667 pg/mL — ABNORMAL HIGH (ref 180–914)

## 2022-11-26 MED ORDER — IPRATROPIUM-ALBUTEROL 0.5-2.5 (3) MG/3ML IN SOLN
3.0000 mL | Freq: Four times a day (QID) | RESPIRATORY_TRACT | Status: AC
  Administered 2022-11-26 – 2022-11-27 (×5): 3 mL via RESPIRATORY_TRACT
  Filled 2022-11-26 (×5): qty 3

## 2022-11-26 MED ORDER — SODIUM CHLORIDE 0.9 % IV SOLN
2.0000 g | INTRAVENOUS | Status: DC
  Administered 2022-11-27 – 2022-11-28 (×2): 2 g via INTRAVENOUS
  Filled 2022-11-26 (×2): qty 20

## 2022-11-26 MED ORDER — BUDESONIDE 0.5 MG/2ML IN SUSP
0.5000 mg | Freq: Two times a day (BID) | RESPIRATORY_TRACT | Status: DC
  Administered 2022-11-26 – 2022-11-28 (×4): 0.5 mg via RESPIRATORY_TRACT
  Filled 2022-11-26 (×4): qty 2

## 2022-11-26 MED ORDER — SODIUM CHLORIDE 0.9 % IV SOLN: INTRAVENOUS | Status: DC | PRN

## 2022-11-26 MED ORDER — PSEUDOEPHEDRINE HCL ER 120 MG PO TB12
120.0000 mg | ORAL_TABLET | Freq: Every day | ORAL | Status: DC
  Filled 2022-11-26: qty 1

## 2022-11-26 MED ORDER — PSEUDOEPHEDRINE HCL ER 120 MG PO TB12
120.0000 mg | ORAL_TABLET | Freq: Once | ORAL | Status: AC
  Administered 2022-11-26: 120 mg via ORAL
  Filled 2022-11-26: qty 1

## 2022-11-26 NOTE — TOC Initial Note (Signed)
Transition of Care [facility]) - Initial/Assessment Note    Patient Details  Name: Ashlee Fields MRN: [id number] Date of Birth: May 11, 1956  Transition of Care [facility]) CM/SW Contact:    Laurena Slimmer, RN Phone Number: 11/26/2022, 11:36 AM  Clinical Narrative:                  Transition of Care [facility]) Screening Note   Patient Details  Name: Ashlee Fields Date of Birth: 06/14/1956   Transition of Care [facility]) CM/SW Contact:    Laurena Slimmer, RN Phone Number: 11/26/2022, 11:36 AM    Transition of Care Department [facility]) has reviewed patient and no TOC needs have been identified at this time. We will continue to monitor patient advancement through interdisciplinary progression rounds. If new patient transition needs arise, please place a TOC consult.          Patient Goals and CMS Choice            Expected Discharge Plan and Services                                              Prior Living Arrangements/Services                       Activities of Daily Living Home Assistive Devices/Equipment: Environmental consultant (specify type), Wheelchair, C-collar ADL Screening (condition at time of admission) Patient's cognitive ability adequate to safely complete daily activities?: Yes Is the patient deaf or have difficulty hearing?: No Does the patient have difficulty seeing, even when wearing glasses/contacts?: No Does the patient have difficulty concentrating, remembering, or making decisions?: No Patient able to express need for assistance with ADLs?: Yes Does the patient have difficulty dressing or bathing?: No Independently performs ADLs?: No Communication: Independent Dressing (OT): Independent Grooming: Independent Feeding: Independent Bathing: Independent Toileting: Needs assistance Is this a change from baseline?: Change from baseline, expected to last <3 days In/Out Bed: Needs assistance Is this a change from baseline?: Change from baseline, expected to  last <3 days Walks in Home: Needs assistance Is this a change from baseline?: Change from baseline, expected to last <3 days Does the patient have difficulty walking or climbing stairs?: Yes Weakness of Legs: Left Weakness of Arms/Hands: Left  Permission Sought/Granted                  Emotional Assessment              Admission diagnosis:  Influenza A [J10.1] Acute respiratory failure with hypoxia ([place]) [J96.01] Neutropenia, unspecified type ([place]) [D70.9] Patient Active Problem List   Diagnosis Date Noted   Normocytic anemia 11/25/2022   Hypokalemia 11/24/2022   Bacteremia due to Streptococcus pneumoniae 11/23/2022   Influenza A 11/22/2022   Acute respiratory failure with hypoxia ([place]) 11/22/2022   HTN (hypertension) 11/22/2022   HLD (hyperlipidemia) 11/22/2022   AKI (acute kidney injury) ([place]) 11/22/2022   Obesity (BMI 30-39.9) 11/22/2022   Severe sepsis ([place]) 11/22/2022   Hematuria 02/29/2020   Acute on chronic respiratory failure with hypoxia ([place]) 02/29/2020   Hypothyroidism    GERD (gastroesophageal reflux disease)    S/P TKR (total knee replacement) using cement, right 02/28/2020   Pulmonary hypertension ([place]) 08/05/2017   SOB (shortness of breath) on exertion 07/10/2017   COPD exacerbation ([place]) 10/07/2016   Fibrocystic breast disease 10/07/2016  Hyperlipidemia, unspecified 10/07/2016   Obesity 10/07/2016   Osteoarthritis 10/07/2016   Gait disturbance 10/07/2016   Other fatigue 10/07/2016   Left hemiplegia ([place]) 10/07/2016   MS (multiple sclerosis) ([place]) 05/06/2016   Diabetes mellitus without complication ([place]) 36/64/4034   HTN, goal below 140/90 02/27/2015   Adiposity 01/02/2015   Anxiety 01/02/2015   Depression with anxiety 01/02/2015   Environmental allergies 01/02/2015   Tobacco dependence syndrome 01/02/2015   PCP:  [name], MD Pharmacy:   [id number] [place] [address]Phone: [phone]: [phone]    Social Determinants of Health (SDOH) Social History: SDOH Screenings   Food Insecurity: No Food Insecurity (11/24/2022)  Housing: Low Risk  (11/24/2022)  Transportation Needs: No Transportation Needs (11/24/2022)  Utilities: Not At Risk (11/24/2022)  Tobacco Use: Medium Risk (11/24/2022)   SDOH Interventions:     Readmission Risk Interventions     No data to display

## 2022-11-26 NOTE — Plan of Care (Signed)
  Problem: Respiratory: Goal: Ability to maintain a clear airway will improve Outcome: Progressing   Problem: Clinical Measurements: Goal: Ability to maintain a body temperature in the normal range will improve Outcome: Progressing   Problem: Clinical Measurements: Goal: Will remain free from infection Outcome: Progressing   Problem: Coping: Goal: Level of anxiety will decrease Outcome: Progressing   Problem: Pain Managment: Goal: General experience of comfort will improve Outcome: Progressing   Problem: Safety: Goal: Ability to remain free from injury will improve Outcome: Progressing

## 2022-11-26 NOTE — Progress Notes (Signed)
*  PRELIMINARY RESULTS* Echocardiogram 2D Echocardiogram has been performed.  Ashlee Fields 11/26/2022, 9:51 AM

## 2022-11-26 NOTE — Progress Notes (Signed)
ID Pt wants to go home Husband at bed side She is very sob, getting nebulizer treatment Anxious  Patient Vitals for the past 24 hrs:  BP Temp Temp src Pulse Resp SpO2  11/26/22 2000 (!) 169/92 97.7 F (36.5 C) Oral 86 20 95 %  11/26/22 1941 -- -- -- -- -- 96 %  11/26/22 1702 (!) 174/95 -- -- 81 -- 97 %  11/26/22 1500 -- -- -- -- -- 94 %  11/26/22 1341 -- -- -- -- -- 94 %  11/26/22 1224 (!) 164/113 -- -- 87 (!) 23 98 %  11/26/22 0802 (!) 174/83 -- -- 71 18 98 %  11/26/22 0700 -- -- -- -- -- 95 %  11/26/22 0400 (!) 149/77 99.4 F (37.4 C) Oral 76 18 96 %  11/26/22 0223 -- -- -- -- -- 93 %  11/25/22 2334 (!) 162/93 98 F (36.7 C) -- 78 20 96 %    Chest b/l rhonchi HS tachycardia Abd soft Cns -non focal  Labs    Latest Ref Rng & Units 11/26/2022    7:14 AM 11/25/2022    5:02 AM 11/24/2022    3:57 AM  CBC  WBC 4.0 - 10.5 K/uL 7.1  6.8  6.2   Hemoglobin 12.0 - 15.0 g/dL 9.6  9.2  10.9   Hematocrit 36.0 - 46.0 % 30.3  29.3  34.7   Platelets 150 - 400 K/uL 167  154  176        Latest Ref Rng & Units 11/26/2022    7:14 AM 11/25/2022    5:02 AM 11/24/2022    3:57 AM  CMP  Glucose 70 - 99 mg/dL 190  158  132   BUN 8 - 23 mg/dL _0 Creatinine 0.44 - 1.00 mg/dL 0.67  0.78  0.70   Sodium 135 - 145 mmol/L 140  140  140   Potassium 3.5 - 5.1 mmol/L 3.9  3.8  3.2   Chloride 98 - 111 mmol/L 104  104  107   CO2 22 - 32 mmol/L _1 Calcium 8.9 - 10.3 mg/dL 7.6  7.6  7.5      Micro BC- Pneumococcus   Impression/recommendation Influenza A resp illness with multifocal pneumonia  Strep Pneumoniae bacteremia and secondary bacterial pneumonia Continue ceftriaxone and tamiflu Once stable from respiratory status can be switched to PO levaquin until 12/02/22  COPD exacerbation Acute hypoxic resp failure due to above On prednisone  Severe neutropenia/leucopenia due to sepsis - resolved  Discussed the management with patient and her husband

## 2022-11-26 NOTE — Progress Notes (Signed)
PROGRESS NOTE  Ashlee Fields    DOB: 1955-12-15, 67 y.o.  HQI:[id number]    Code Status: Full Code   DOA: 11/22/2022   LOS: 4   Brief hospital course  Ashlee Fields is a 67 y.o. female with a PMH significant for hypertension, hyperlipidemia, diabetes mellitus, COPD, GERD, hypothyroidism, depression with anxiety, OSA on CPAP, multiple sclerosis with left-sided weakness, pulmonary hypertension.  They presented from home to the ED on 11/22/2022 with SOB x 2 days with productive cough. She is not on oxygen at baseline  In the ED, it was found that they had severe respiratory distress, oxygen desaturation to 83% on room air, could not speak in full sentence, initially started 6 L/min oxygen, but due to ongoing respiratory distress was started on BiPAP in ED. Temperature normal, blood pressure 167/101, heart rate 96, RR 32.  Significant findings included Positive flu A PCR, lactic acid 4.5, 3.7, procalcitonin 46.62, BNP 426, troponin level 10, neutropenia with WBC 0.8 (neutrophils 13%, lymphocyte 61%).  Noted prior WBC 11.9 on 09/15/2022.  AKI with creatinine 1.41 (baseline creatinine 0.7 on 09/15/22). Chest x-ray showed possible right lower lobe and left lower lobe infiltration, concerning for pneumonia.   They were initially treated with empiric IV antibiotics for likely bacterial pneumonia secondary to influenza A infection.   12/31: remained on BiPAP with ongoing tachypnea and increased work of breathing this morning.  Tolerated two hour break this AM.  Feels improving overall.    1/1--1/2: pt improving slowly, still on oxygen. Positive blood culture with Strep pneumo, ID consulted. Recommended continued CTX.  11/26/22 -stable, unchanged  Assessment & Plan  Principal Problem:   Influenza A Active Problems:   Acute on chronic respiratory failure with hypoxia (HCC)   Severe sepsis (HCC)   Bacteremia due to Streptococcus pneumoniae   COPD exacerbation (HCC)   AKI (acute kidney injury)  (Ashlee Fields)   Diabetes mellitus without complication (HCC)   Hypothyroidism   HTN (hypertension)   HLD (hyperlipidemia)   MS (multiple sclerosis) (Ashlee Fields)   Depression with anxiety   Obesity (BMI 30-39.9)   Hypokalemia   Normocytic anemia  Influenza A Acute on chronic respiratory failure with hypoxia- no oxygen requirement at baseline, continues on 3L [place]. Has significant wheezing on exam still. - continue Tamiflu x 5 days - Supportive care per orders - scheduled inhalers - wean to room air as tolerated - incentive spirometer - continue steroid course - Recommend monitoring pulse ox with her home baseline activity prior to discharge to determine if home oxygen is required.    Bacteremia due to Streptococcus pneumoniae- pan-sensitive.  Admission blood culture positive for Strep pneumoniae. --Repeat Bcx ordered- NGTD --Infectious disease consulted - consider transition to PO Abx tomorrow to accommodate for patient's wishes to go home   Severe sepsis ([place]) POA with WBC 0.8, heart rate 96, RR 32, lactic acid 5.4, 3.7.  Procalcitonin 46.62.  Chest x-ray findings consistent with pneumonia.  Flu A positive. --Continue antibiotics --Follow cultures - f/u echo   COPD exacerbation (HCC) Due to Flu and pneumonia.  Continue neb treatments, supportive care O2 per protocol   AKI- Resolved. Likely due to dehydration and continuation Diovan -Resume home Diovan -Monitor BMP   Neutropenia (HCC)-resolved as of 11/24/2022 Likely due to acute infection and myelosuppression. Hematology consulted on admission. Follow CBC   Diabetes mellitus without complication (HCC) Recent A1c 7.1, well controlled.   Patient is taking Victoza and metformin -Sliding scale insulin   Hypothyroidism -Synthroid  HTN (hypertension) -Resume Diovan, held due to AKI and sepsis which have resolved -Continue Zebeta   HLD (hyperlipidemia) -Lipitor    MS (multiple sclerosis) (Ashlee Fields) Minimal mobility at baseline. She  stands and transfers okay at home when healthy. Recommend monitoring pulse ox with her home baseline activity prior to discharge to determine if home oxygen is required.  Pt is on prednisone 10 mg daily.   Has chronic left-sided weakness -Now higher dose steroids as above   Depression with anxiety -Continue home medications    Obesity (BMI 30-39.9) Body mass index is 33.66 kg/m. Complicates overall care and prognosis.  Recommend lifestyle modifications including physical activity and diet for weight loss and overall long-term health.   Normocytic anemia- stable 11.7 >> 10.9 >> 9.2>9.6.  Per chart review was B12 deficient last year.  No evidence of bleeding. --Anemia panel with AM labs --Monitor CBC   Hypokalemia Replaced on 11/24/22 for K 3.2  Monitor BMP, Mg level  Body mass index is 33.66 kg/m.  VTE ppx: enoxaparin (LOVENOX) injection 40 mg Start: 11/22/22 2200  Diet:     Diet   Diet heart healthy/carb modified Fluid consistency: Thin   Consultants: ID  Subjective 11/26/22    Pt reports improvement in her breathing. Continues to have productive cough. Her biggest concern is her anxiety for being in the hospital. Despite her progress with her breathing, she would like to go home tomorrow at the latest.    Objective   Vitals:   11/25/22 2334 11/26/22 0223 11/26/22 0400 11/26/22 0700  BP: (!) 162/93  (!) 149/77   Pulse: 78  76   Resp: 20  18   Temp: 98 F (36.7 C)  99.4 F (37.4 C)   TempSrc:   Oral   SpO2: 96% 93% 96% 95%  Weight:      Height:        Intake/Output Summary (Last 24 hours) at 11/26/2022 0748 Last data filed at 11/26/2022 0700 Gross per 24 hour  Intake 220 ml  Output 500 ml  Net -280 ml   Filed Weights   11/22/22 1117  Weight: 86.2 kg     Physical Exam:  General: awake, alert, NAD HEENT: atraumatic, clear conjunctiva, anicteric sclera, MMM, hearing grossly normal Respiratory: normal respiratory effort at rest. Some conversational dyspnea.  Wheezing and rhonchi throughout lung fields  Cardiovascular: quick capillary refill, normal S1/S2, RRR, no JVD, murmurs Nervous: A&O x3. no gross focal neurologic deficits, normal speech Extremities: moves all equally, no edema, normal tone Skin: dry, intact, normal temperature, normal color. No rashes, lesions or ulcers on exposed skin Psychiatry: anxious mood, congruent affect  Labs   I have personally reviewed the following labs and imaging studies CBC    Component Value Date/Time   WBC 7.1 11/26/2022 0714   RBC 3.27 (L) 11/26/2022 0714   RBC 3.25 (L) 11/26/2022 0714   HGB 9.6 (L) 11/26/2022 0714   HGB 13.3 10/07/2016 1657   HCT 30.3 (L) 11/26/2022 0714   HCT 38.8 10/07/2016 1657   PLT 167 11/26/2022 0714   PLT 340 10/07/2016 1657   MCV 93.2 11/26/2022 0714   MCV 89 10/07/2016 1657   MCH 29.5 11/26/2022 0714   MCHC 31.7 11/26/2022 0714   RDW 15.6 (H) 11/26/2022 0714   RDW 16.0 (H) 10/07/2016 1657   LYMPHSABS 0.2 (L) 11/23/2022 0448   LYMPHSABS 1.1 10/07/2016 1657   MONOABS 0.0 (L) 11/23/2022 0448   EOSABS 0.0 11/23/2022 0448   EOSABS 0.0 10/07/2016  1657   BASOSABS 0.1 11/23/2022 0448   BASOSABS 0.0 10/07/2016 1657      Latest Ref Rng & Units 11/25/2022    5:02 AM 11/24/2022    3:57 AM 11/23/2022    4:48 AM  BMP  Glucose 70 - 99 mg/dL 158  132  187   BUN 8 - 23 mg/dL _0 Creatinine 0.44 - 1.00 mg/dL 0.78  0.70  0.82   Sodium 135 - 145 mmol/L 140  140  141   Potassium 3.5 - 5.1 mmol/L 3.8  3.2  4.0   Chloride 98 - 111 mmol/L 104  107  107   CO2 22 - 32 mmol/L _1 Calcium 8.9 - 10.3 mg/dL 7.6  7.5  8.0     Disposition Plan & Communication  Patient status: Inpatient  Admitted From: Home Planned disposition location: Home health Anticipated discharge date: 1/4 pending stability and per patient wishes to go home. Will likely need home oxygen  Family Communication: husband at bedside    Author: Richarda Osmond, DO [facility] 11/26/2022,  7:48 AM   Available by Epic secure chat 7AM-7PM. If 7PM-7AM, please contact night-coverage.  [facility] contact information found on [email or web address].

## 2022-11-27 DIAGNOSIS — G35 Multiple sclerosis: Secondary | ICD-10-CM | POA: Diagnosis not present

## 2022-11-27 DIAGNOSIS — J9601 Acute respiratory failure with hypoxia: Secondary | ICD-10-CM | POA: Diagnosis not present

## 2022-11-27 DIAGNOSIS — J101 Influenza due to other identified influenza virus with other respiratory manifestations: Secondary | ICD-10-CM | POA: Diagnosis not present

## 2022-11-27 DIAGNOSIS — D709 Neutropenia, unspecified: Secondary | ICD-10-CM | POA: Diagnosis not present

## 2022-11-27 LAB — CBC
HCT: 29.9 % — ABNORMAL LOW (ref 36.0–46.0)
Hemoglobin: 9.9 g/dL — ABNORMAL LOW (ref 12.0–15.0)
MCH: 30.1 pg (ref 26.0–34.0)
MCHC: 33.1 g/dL (ref 30.0–36.0)
MCV: 90.9 fL (ref 80.0–100.0)
Platelets: 201 10*3/uL (ref 150–400)
RBC: 3.29 MIL/uL — ABNORMAL LOW (ref 3.87–5.11)
RDW: 15.2 % (ref 11.5–15.5)
WBC: 9.3 10*3/uL (ref 4.0–10.5)
nRBC: 0 % (ref 0.0–0.2)

## 2022-11-27 LAB — BASIC METABOLIC PANEL
Anion gap: 10 (ref 5–15)
BUN: 16 mg/dL (ref 8–23)
CO2: 27 mmol/L (ref 22–32)
Calcium: 7.9 mg/dL — ABNORMAL LOW (ref 8.9–10.3)
Chloride: 102 mmol/L (ref 98–111)
Creatinine, Ser: 0.56 mg/dL (ref 0.44–1.00)
GFR, Estimated: >60 mL/min (ref >60)
Glucose, Bld: 122 mg/dL — ABNORMAL HIGH (ref 70–99)
Potassium: 3.1 mmol/L — ABNORMAL LOW (ref 3.5–5.1)
Sodium: 139 mmol/L (ref 135–145)

## 2022-11-27 LAB — CULTURE, BLOOD (ROUTINE X 2)
Culture: NO GROWTH
Special Requests: ADEQUATE

## 2022-11-27 LAB — GLUCOSE, CAPILLARY
Glucose-Capillary: 109 mg/dL — ABNORMAL HIGH (ref 70–99)
Glucose-Capillary: 209 mg/dL — ABNORMAL HIGH (ref 70–99)
Glucose-Capillary: 170 mg/dL — ABNORMAL HIGH (ref 70–99)
Glucose-Capillary: 135 mg/dL — ABNORMAL HIGH (ref 70–99)

## 2022-11-27 MED ORDER — POTASSIUM CHLORIDE CRYS ER 20 MEQ PO TBCR
40.0000 meq | EXTENDED_RELEASE_TABLET | Freq: Two times a day (BID) | ORAL | Status: AC
  Administered 2022-11-27 – 2022-11-28 (×2): 40 meq via ORAL
  Filled 2022-11-27 (×2): qty 2

## 2022-11-27 MED ORDER — IPRATROPIUM-ALBUTEROL 0.5-2.5 (3) MG/3ML IN SOLN: 3.0000 mL | Freq: Four times a day (QID) | RESPIRATORY_TRACT | 0 refills | Status: DC | PRN

## 2022-11-27 MED ORDER — LEVOFLOXACIN 750 MG PO TABS: 750.0000 mg | ORAL_TABLET | Freq: Every day | ORAL | 0 refills | Status: AC

## 2022-11-27 NOTE — Evaluation (Signed)
Occupational Therapy Evaluation Patient Details Name: Ashlee Fields MRN: [id number] DOB: 09-08-1956 Today's Date: 11/27/2022   History of Present Illness Pt is a 67 year old female presenting to the ED with SOB with productive cough, admitted with infuenza a, Acute on chronic respiratory failure with hypoxia, Bacteremia due to Streptococcus pneumoniae, severe sepsis, COPD exacerbation; Pmh significant for  hypertension, hyperlipidemia, diabetes mellitus, COPD, GERD, hypothyroidism, depression with anxiety, OSA on CPAP, multiple sclerosis with left-sided weakness, pulmonary hypertension.   Clinical Impression   Chart reviewed, pt greeted in bed with husband present, agreeable to OT evaluation. Pt is alert and oriented x4 however multi modal cues for processing on this date with fair awareness of current level of deficits. PTA pt amb very short household distances (a few shuffled steps) with no AD, outward reaching for walls/furniture, typically uses pwc farther household and community distances. Pt presents with deficits in strength, endurance, activity tolerance, balance all affecting safe and optimal ADL completion. MAX A +2 required for bed mobility, MAX A +2 for STS transfer to bedside char. Discussion with pt and husband re: discharge recommendation, assistance level needed at home. Husband reports he feels he will be able to help her. Also of note, pt on new oxygen requirement at this time. At this time, recommend discharge to STR to address functional deficits. OT will continue to follow acutely.    Recommendations for follow up therapy are one component of a multi-disciplinary discharge planning process, led by the attending physician.  Recommendations may be updated based on patient status, additional functional criteria and insurance authorization.   Follow Up Recommendations  Skilled nursing-short term rehab (<3 hours/day)     Assistance Recommended at Discharge Frequent or constant  Supervision/Assistance  Patient can return home with the following A lot of help with walking and/or transfers;A lot of help with bathing/dressing/bathroom    Functional Status Assessment  Patient has had a recent decline in their functional status and demonstrates the ability to make significant improvements in function in a reasonable and predictable amount of time.  Equipment Recommendations  Wheelchair (measurements OT) (if power chair is too heavy for pt to push, consider hoyer lift if pt does not progress)    Recommendations for Other Services       Precautions / Restrictions Precautions Precautions: Fall Restrictions Weight Bearing Restrictions: No      Mobility Bed Mobility Overal bed mobility: Needs Assistance Bed Mobility: Supine to Sit     Supine to sit: Max assist, +2 for physical assistance     General bed mobility comments: step by step vcs for technique    Transfers Overall transfer level: Needs assistance Equipment used: 2 person hand held assist Transfers: Sit to/from Stand Sit to Stand: Max assist, +2 physical assistance, +2 safety/equipment                  Balance Overall balance assessment: Needs assistance Sitting-balance support: Feet supported Sitting balance-Leahy Scale: Poor Sitting balance - Comments: right and left latearl lean during static sitting   Standing balance support: Bilateral upper extremity supported, During functional activity Standing balance-Leahy Scale: Zero                             ADL either performed or assessed with clinical judgement   ADL Overall ADL's : Needs assistance/impaired Eating/Feeding: Set up;Sitting           Lower Body Bathing: Maximal assistance  Upper Body Dressing : Minimal assistance;Sitting   Lower Body Dressing: Maximal assistance Lower Body Dressing Details (indicate cue type and reason): socks in bed Toilet Transfer: Maximal assistance;+2 for physical  assistance Toilet Transfer Details (indicate cue type and reason): stand pivot to bedside chair; simulated Toileting- Clothing Manipulation and Hygiene: Maximal assistance               Vision Patient Visual Report: No change from baseline       Perception     Praxis      Pertinent Vitals/Pain Pain Assessment Pain Assessment: Faces Faces Pain Scale: Hurts a little bit Pain Location: generalized Pain Descriptors / Indicators: Discomfort Pain Intervention(s): Limited activity within patient's tolerance, Monitored during session, Repositioned     Hand Dominance Right   Extremity/Trunk Assessment Upper Extremity Assessment Upper Extremity Assessment: LUE deficits/detail;Generalized weakness LUE Deficits / Details: baseline LUE deficits- edema noted throughout L hand, minimal volitional shoulder/elbow movement noted; weak grip strength   Lower Extremity Assessment Lower Extremity Assessment: Defer to PT evaluation;LLE deficits/detail LLE Deficits / Details: baseline LLE deficits       Communication Communication Communication: No difficulties   Cognition Arousal/Alertness: Awake/alert Behavior During Therapy: WFL for tasks assessed/performed, Restless Overall Cognitive Status: Within Functional Limits for tasks assessed Area of Impairment: Safety/judgement, Awareness, Problem solving                         Safety/Judgement: Decreased awareness of deficits Awareness: Emergent Problem Solving: Requires verbal cues, Requires tactile cues, Difficulty sequencing       General Comments  spo2 down to 86% on 2L via [place] during bed mobility, appears 88% and above on 3 L via [place] throughout the rest of the evaluation    Exercises     Shoulder Instructions      Home Living Family/patient expects to be discharged to:: Private residence Living Arrangements: Spouse/significant other Available Help at Discharge: Family;Available 24 hours/day Type of Home:  House Home Access: Ramped entrance     Home Layout: One level (sunken level room with a ramp)               Home Equipment: Rolling Walker (2 wheels);BSC/3in1;Grab bars - toilet;Grab bars - tub/shower;Wheelchair - power   Additional Comments: adjutable sleep number bed, american standard tub (walk in)      Prior Functioning/Environment Prior Level of Function : Needs assist             Mobility Comments: short shuffling steps from bed>chair, utilizes pwc for most home/community distances ADLs Comments: grooming, UB dressing with MOD I, assist for LB dressing/bathing        OT Problem List: Decreased strength;Decreased activity tolerance;Decreased knowledge of use of DME or AE;Decreased safety awareness;Decreased cognition;Decreased knowledge of precautions;Impaired balance (sitting and/or standing)      OT Treatment/Interventions: Self-care/ADL training;DME and/or AE instruction;Therapeutic activities;Balance training;Therapeutic exercise;Manual therapy;Energy conservation;Patient/family education    OT Goals(Current goals can be found in the care plan section) Acute Rehab OT Goals Patient Stated Goal: go home OT Goal Formulation: With patient/family Time For Goal Achievement: 12/11/22 Potential to Achieve Goals: Fair ADL Goals Pt Will Perform Grooming: with supervision Pt Will Perform Lower Body Dressing: with mod assist Pt Will Transfer to Toilet: with mod assist Pt Will Perform Toileting - Clothing Manipulation and hygiene: with mod assist;sitting/lateral leans  OT Frequency: Min 2X/week    Co-evaluation PT/OT/SLP Co-Evaluation/Treatment: Yes Reason for Co-Treatment: Necessary to address cognition/behavior during functional activity;To  address functional/ADL transfers;For patient/therapist safety;Complexity of the patient's impairments (multi-system involvement)   OT goals addressed during session: ADL's and self-care      AM-PAC OT "6 Clicks" Daily Activity      Outcome Measure Help from another person eating meals?: A Little Help from another person taking care of personal grooming?: A Little Help from another person toileting, which includes using toliet, bedpan, or urinal?: A Lot Help from another person bathing (including washing, rinsing, drying)?: A Lot Help from another person to put on and taking off regular upper body clothing?: A Little Help from another person to put on and taking off regular lower body clothing?: A Lot 6 Click Score: 15   End of Session Equipment Utilized During Treatment: Oxygen Nurse Communication: Mobility status  Activity Tolerance: Patient limited by fatigue Patient left: in chair;with call bell/phone within reach;with family/visitor present  OT Visit Diagnosis: Unsteadiness on feet (R26.81);Muscle weakness (generalized) (M62.81)                Time: 1443-1540 OT Time Calculation (min): 27 min Charges:  OT General Charges $OT Visit: 1 Visit OT Evaluation $OT Eval Moderate Complexity: 1 Mod  Shanon Payor, OTD OTR/L  11/27/22, 11:49 AM

## 2022-11-27 NOTE — Discharge Summary (Incomplete)
Physician Discharge Summary  Patient: Ashlee Fields ZOX:[id number] DOB: 1956/03/12   Code Status: Full Code Admit date: 11/22/2022 Discharge date: 11/27/2022 Disposition: Home health, PT, OT, and RN PCP: [name], MD  Recommendations for Outpatient Follow-up:  Follow up with PCP within 5 days Regarding general hospital follow up and preventative care Recommend monitoring respiratory status Discharged on 3L O2nc and levoquin to be completed 12/02/22   Discharge Diagnoses:  Principal Problem:   Influenza A Active Problems:   Acute on chronic respiratory failure with hypoxia (HCC)   Severe sepsis ([place])   Bacteremia due to Streptococcus pneumoniae   COPD exacerbation ([place])   AKI (acute kidney injury) ([place])   Diabetes mellitus without complication ([place])   Hypothyroidism   HTN (hypertension)   HLD (hyperlipidemia)   MS (multiple sclerosis) ([employer])   Depression with anxiety   Obesity (BMI 30-39.9)   Hypokalemia   Normocytic anemia   Neutropenia associated with infection [facility])  Brief Hospital Course Summary: Ashlee Fields is a 67 y.o. female with a PMH significant for hypertension, hyperlipidemia, diabetes mellitus, COPD, GERD, hypothyroidism, depression with anxiety, OSA on CPAP, multiple sclerosis with left-sided weakness, pulmonary hypertension.   They presented from home to the ED on 11/22/2022 with SOB x 2 days with productive cough. She is not on oxygen at baseline   In the ED, it was found that they had severe respiratory distress, oxygen desaturation to 83% on room air, could not speak in full sentence, initially started 6 L/min oxygen, but due to ongoing respiratory distress was started on BiPAP in ED. Temperature normal, blood pressure 167/101, heart rate 96, RR 32.  Significant findings included Positive flu A PCR, lactic acid 4.5, 3.7, procalcitonin 46.62, BNP 426, troponin level 10, neutropenia with WBC 0.8 (neutrophils 13%, lymphocyte 61%).  Noted prior WBC  11.9 on 09/15/2022.  AKI with creatinine 1.41 (baseline creatinine 0.7 on 09/15/22). Chest x-ray showed possible right lower lobe and left lower lobe infiltration, concerning for pneumonia.    They were initially treated with empiric IV antibiotics for likely bacterial pneumonia secondary to influenza A infection.    12/31: remained on BiPAP with ongoing tachypnea and increased work of breathing this morning.  Tolerated two hour break this AM.  Feels improving overall.    1/1--1/2: pt improving slowly, still on oxygen. Positive blood culture with Strep pneumo, ID consulted. Recommended continued CTX.  I highly encouraged patient to stay in hospital as she was not recovered. She was also recommended to go to SNF at discharge but she and husband declined this as well.   Discharge Condition: {DISCHARGE CONDITION:19696}, improved Recommended discharge diet: {Discharge WJXB:[id number]}  Consultations: ***  Procedures/Studies: ***   Allergies as of 11/27/2022       Reactions   Penicillins Hives   Did it involve swelling of the face/tongue/throat, SOB, or low BP? No Did it involve sudden or severe rash/hives, skin peeling, or any reaction on the inside of your mouth or nose? Yes Did you need to seek medical attention at a hospital or doctor's office? Unknown When did it last happen?      20 + years If all above answers are "NO", may proceed with cephalosporin use.   Other Hives   succinylsulfathiazole   Ibuprofen Rash   Sulfa Antibiotics Hives, Nausea And Vomiting, Itching, Rash        Medication List     STOP taking these medications    famotidine 40 MG tablet Commonly  known as: PEPCID       TAKE these medications    acetaminophen 500 MG tablet Commonly known as: TYLENOL Take 2 tablets (1,000 mg total) by mouth 3 (three) times daily.   ascorbic acid 500 MG tablet Commonly known as: VITAMIN C Take 500 mg by mouth daily.   atorvastatin 80 MG tablet Commonly known as:  LIPITOR Take 80 mg by mouth at bedtime.   bisoprolol 10 MG tablet Commonly known as: ZEBETA Take 10 mg by mouth daily.   calcium-vitamin D 500-200 MG-UNIT tablet Commonly known as: OSCAL WITH D Take 1 tablet by mouth 2 (two) times daily. With meals   cyanocobalamin 1000 MCG/ML injection Commonly known as: VITAMIN B12 Inject 1,000 mcg into the muscle every 30 (thirty) days.   diazepam 2 MG tablet Commonly known as: VALIUM Take 2 mg by mouth at bedtime as needed for muscle spasms.   FLUoxetine 20 MG capsule Commonly known as: PROZAC Take 60 mg by mouth daily.   hydrochlorothiazide 25 MG tablet Commonly known as: HYDRODIURIL Take 25 mg by mouth in the morning.   ipratropium-albuterol 0.5-2.5 (3) MG/3ML Soln Commonly known as: DUONEB Take 3 mLs by nebulization every 6 (six) hours as needed.   levofloxacin 750 MG tablet Commonly known as: Levaquin Take 1 tablet (750 mg total) by mouth daily for 5 days.   levothyroxine 50 MCG tablet Commonly known as: SYNTHROID Take 50 mcg by mouth daily before breakfast.   LORazepam 1 MG tablet Commonly known as: ATIVAN Take 1 mg by mouth 2 (two) times daily.   metFORMIN 500 MG 24 hr tablet Commonly known as: GLUCOPHAGE-XR Take 1,500 mg by mouth every evening.   methylphenidate 10 MG tablet Commonly known as: RITALIN Take 40 mg by mouth daily.   multivitamin with minerals tablet Take 1 tablet by mouth daily.   ondansetron 4 MG disintegrating tablet Commonly known as: ZOFRAN-ODT Take 4 mg by mouth every 8 (eight) hours as needed for nausea or vomiting.   Ozempic (0.25 or 0.5 MG/DOSE) 2 MG/3ML Sopn Generic drug: Semaglutide(0.25 or 0.5MG/DOS) Inject 0.5 mg into the skin every Wednesday.   pantoprazole 40 MG tablet Commonly known as: PROTONIX Take 40 mg by mouth 2 (two) times daily.   Phendimetrazine Tartrate 35 MG Tabs Take 35 mg by mouth 3 (three) times daily.   predniSONE 10 MG tablet Commonly known as: DELTASONE Take  10 mg by mouth daily.   traMADol 50 MG tablet Commonly known as: ULTRAM Take 50-100 mg by mouth every 8 (eight) hours as needed for moderate pain.   valsartan-hydrochlorothiazide 320-12.5 MG tablet Commonly known as: DIOVAN-HCT Take 1 tablet by mouth at bedtime.               Durable Medical Equipment  (From admission, onward)           Start     Ordered   11/27/22 1210  For home use only DME Pulse oximeter  Once        11/27/22 1209   11/27/22 1038  For home use only DME standard manual wheelchair with seat cushion  Once       Comments: Patient suffers from [place] which impairs their ability to perform daily activities like bathing, dressing, feeding, grooming, and toileting in the home.  A cane, crutch, or walker will not resolve issue with performing activities of daily living. A wheelchair will allow patient to safely perform daily activities. Patient can safely propel the wheelchair in the home  or has a caregiver who can provide assistance. Length of need Lifetime. Accessories: elevating leg rests (ELRs), wheel locks, extensions and anti-tippers.   11/27/22 1038   11/27/22 1037  For home use only DME Nebulizer/meds  Once       Question Answer Comment  Patient needs a nebulizer to treat with the following condition CAP (community acquired pneumonia)   Length of Need 6 Months      11/27/22 1036   11/27/22 1037  For home use only DME Nebulizer machine  Once       Question Answer Comment  Patient needs a nebulizer to treat with the following condition CAP (community acquired pneumonia)   Length of Need 6 Months      11/27/22 1036   11/27/22 1034  For home use only DME oxygen  Once       Question Answer Comment  Length of Need 6 Months   Mode or (Route) Nasal cannula   Liters per Minute 3   Frequency Continuous (stationary and portable oxygen unit needed)   Oxygen conserving device Yes   Oxygen delivery system Gas      11/27/22 1034             Subjective    Pt reports ***  All questions and concerns were addressed at time of discharge.  Objective  Blood pressure (!) 166/86, pulse 88, temperature 98.2 F (36.8 C), temperature source Oral, resp. rate 20, height _0  (1.6 m), weight 85.7 kg, SpO2 98 %.   General: Pt is alert, awake, not in acute distress Cardiovascular: RRR, S1/S2 +, no rubs, no gallops Respiratory: CTA bilaterally, no wheezing, no rhonchi Abdominal: Soft, NT, ND, bowel sounds + Extremities: no edema, no cyanosis  The results of significant diagnostics from this hospitalization (including imaging, microbiology, ancillary and laboratory) are listed below for reference.   Imaging studies: ECHOCARDIOGRAM COMPLETE  Result Date: 11/26/2022    ECHOCARDIOGRAM REPORT   Patient Name:   KEMBA HOPPES Date of Exam: 11/26/2022 Medical Rec #:  [id number]       Height:       63.0 in Accession #:    [id number]      Weight:       190.0 lb Date of Birth:  [birth year]      BSA:          1.892 m Patient Age:    68 years        BP:           174/83 mmHg Patient Gender: F               HR:           84 bpm. Exam Location:  [facility] Procedure: 2D Echo, Cardiac Doppler and Color Doppler Indications:     Bacteremia  History:         Patient has no prior history of Echocardiogram examinations.                  Pulmonary HTN and COPD, Signs/Symptoms:Bacteremia and Shortness                  of Breath; Risk Factors:Hypertension, Diabetes, Dyslipidemia                  and Current Smoker. Multiple Sclerosis, Flu +.  Sonographer:     Wenda Low Referring Phys:  [id number] Tsosie Billing Diagnosing Phys: Ida Rogue MD  Sonographer Comments: Patient is obese. Image acquisition challenging  due to respiratory motion. IMPRESSIONS  1. Left ventricular ejection fraction, by estimation, is 45 to 50%. The left ventricle has mildly decreased function. The left ventricle demonstrates regional wall motion abnormalities (hypokinesis of the basal to mid septal wall).  Left ventricular diastolic parameters are indeterminate.  2. Right ventricular systolic function is normal. The right ventricular size is normal. There is severely elevated pulmonary artery systolic pressure. The estimated right ventricular systolic pressure is 30.0 mmHg.  3. The mitral valve is normal in structure,leaflets are mildly thickened with mild calcification. Moderate mitral valve regurgitation. No evidence of mitral stenosis.  4. Tricuspid valve regurgitation is moderate.  5. The aortic valve has an indeterminant number of cusps. There is mild calcification of the aortic valve. Aortic valve regurgitation is not visualized. Aortic valve sclerosis is present, with no evidence of aortic valve stenosis.  6. The inferior vena cava is normal in size with greater than 50% respiratory variability, suggesting right atrial pressure of 3 mmHg. FINDINGS  Left Ventricle: Left ventricular ejection fraction, by estimation, is 45 to 50%. The left ventricle has mildly decreased function. The left ventricle demonstrates regional wall motion abnormalities. The left ventricular internal cavity size was normal in size. There is no left ventricular hypertrophy. Left ventricular diastolic parameters are indeterminate. Right Ventricle: The right ventricular size is normal. No increase in right ventricular wall thickness. Right ventricular systolic function is normal. There is severely elevated pulmonary artery systolic pressure. The tricuspid regurgitant velocity is 3.36 m/s, and with an assumed right atrial pressure of 15 mmHg, the estimated right ventricular systolic pressure is 92.3 mmHg. Left Atrium: Left atrial size was normal in size. Right Atrium: Right atrial size was normal in size. Pericardium: There is no evidence of pericardial effusion. Mitral Valve: The mitral valve is normal in structure. There is mild thickening of the mitral valve leaflet(s). There is mild calcification of the mitral valve leaflet(s). Moderate  mitral valve regurgitation. No evidence of mitral valve stenosis. MV peak  gradient, 4.8 mmHg. The mean mitral valve gradient is 2.0 mmHg. Tricuspid Valve: The tricuspid valve is normal in structure. Tricuspid valve regurgitation is moderate . No evidence of tricuspid stenosis. Aortic Valve: The aortic valve has an indeterminant number of cusps. There is mild calcification of the aortic valve. Aortic valve regurgitation is not visualized. Aortic valve sclerosis is present, with no evidence of aortic valve stenosis. Aortic valve  mean gradient measures 3.0 mmHg. Aortic valve peak gradient measures 5.9 mmHg. Aortic valve area, by VTI measures 2.89 cm. Pulmonic Valve: The pulmonic valve was normal in structure. Pulmonic valve regurgitation is trivial. No evidence of pulmonic stenosis. Aorta: The aortic root is normal in size and structure. Venous: The inferior vena cava is normal in size with greater than 50% respiratory variability, suggesting right atrial pressure of 3 mmHg. IAS/Shunts: The interatrial septum appears to be lipomatous. No atrial level shunt detected by color flow Doppler.  LEFT VENTRICLE PLAX 2D LVIDd:         4.20 cm   Diastology LVIDs:         3.10 cm   LV e' medial:    11.70 cm/s LV PW:         1.20 cm   LV E/e' medial:  8.6 LV IVS:        1.10 cm   LV e' lateral:   10.20 cm/s LVOT diam:     2.00 cm   LV E/e' lateral: 9.9 LV SV:  63 LV SV Index:   33 LVOT Area:     3.14 cm  RIGHT VENTRICLE RV Basal diam:  3.55 cm RV Mid diam:    3.00 cm RV S prime:     17.00 cm/s TAPSE (M-mode): 2.4 cm LEFT ATRIUM             Index        RIGHT ATRIUM           Index LA diam:        3.70 cm 1.96 cm/m   RA Area:     14.00 cm LA Vol (A2C):   42.8 ml 22.62 ml/m  RA Volume:   31.50 ml  16.65 ml/m LA Vol (A4C):   40.1 ml 21.20 ml/m LA Biplane Vol: 42.9 ml 22.68 ml/m  AORTIC VALVE                    PULMONIC VALVE AV Area (Vmax):    2.32 cm     PV Vmax:       0.90 m/s AV Area (Vmean):   2.73 cm     PV  Peak grad:  3.3 mmHg AV Area (VTI):     2.89 cm AV Vmax:           121.00 cm/s AV Vmean:          71.600 cm/s AV VTI:            0.216 m AV Peak Grad:      5.9 mmHg AV Mean Grad:      3.0 mmHg LVOT Vmax:         89.40 cm/s LVOT Vmean:        62.300 cm/s LVOT VTI:          0.199 m LVOT/AV VTI ratio: 0.92  AORTA Ao Root diam: 3.10 cm MITRAL VALVE                  TRICUSPID VALVE MV Area (PHT): 5.16 cm       TR Peak grad:   45.2 mmHg MV Area VTI:   2.28 cm       TR Vmax:        336.00 cm/s MV Peak grad:  4.8 mmHg MV Mean grad:  2.0 mmHg       SHUNTS MV Vmax:       1.09 m/s       Systemic VTI:  0.20 m MV Vmean:      65.0 cm/s      Systemic Diam: 2.00 cm MV Decel Time: 147 msec MR Peak grad:    170.0 mmHg MR Mean grad:    113.0 mmHg MR Vmax:         652.00 cm/s MR Vmean:        501.0 cm/s MR PISA:         1.01 cm MR PISA Eff ROA: 14 mm MR PISA Radius:  0.40 cm MV E velocity: 101.00 cm/s MV A velocity: 94.30 cm/s MV E/A ratio:  1.07 Ida Rogue MD Electronically signed by Ida Rogue MD Signature Date/Time: 11/26/2022/12:51:37 PM    Final (Updated)    DG Chest Port 1 View  Result Date: 11/22/2022 CLINICAL DATA:  Shortness of breath. EXAM: PORTABLE CHEST 1 VIEW COMPARISON:  AP chest 02/29/2020, chest and left rib radiographs 07/13/2016; CT chest 01/22/2017 FINDINGS: Cardiac silhouette is again mildly enlarged. Mediastinal contours are within normal limits. There is new right lower greater than mid  lung and possible minimal left lower lung heterogeneous airspace opacification. No definite pleural effusion. No pneumothorax. 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 IMPRESSION: New right lower greater than mid lung and possible minimal left lower lung heterogeneous airspace opacification. This could be due to pneumonia or asymmetric pulmonary edema. Electronically Signed   By: Yvonne Kendall M.D.   On: 11/22/2022 11:51    Labs: Basic  Metabolic Panel: Recent Labs  Lab 11/23/22 0448 11/24/22 0357 11/25/22 0502 11/26/22 0714 11/27/22 0452  NA 141 140 140 140 139  K 4.0 3.2* 3.8 3.9 3.1*  CL 107 107 104 104 102  CO2 _0 GLUCOSE 187* 132* 158* 190* 122*  BUN 22 20 24* 22 16  CREATININE 0.82 0.70 0.78 0.67 0.56  CALCIUM 8.0* 7.5* 7.6* 7.6* 7.9*  MG  --  2.2  --   --   --    CBC: Recent Labs  Lab 11/22/22 1129 11/23/22 0448 11/24/22 0357 11/25/22 0502 11/26/22 0714 11/27/22 0452  WBC 0.8* 2.5* 6.2 6.8 7.1 9.3  NEUTROABS 0.2* 2.2  --   --   --   --   HGB 13.6 11.7* 10.9* 9.2* 9.6* 9.9*  HCT 43.3 36.9 34.7* 29.3* 30.3* 29.9*  MCV 93.9 94.1 93.8 94.2 93.2 90.9  PLT 288 212 176 154 167 201   Microbiology: ***  Time coordinating discharge: Over 30 minutes  Richarda Osmond, MD  [facility] 11/27/2022, 12:20 PM

## 2022-11-27 NOTE — Progress Notes (Signed)
Mobility Specialist - Progress Note   11/27/22 1200  Mobility  Activity Transferred from chair to bed  Level of Assistance Total care  Assistive Device None (slide transfer)  Distance Ambulated (ft) 0 ft  $Mobility charge 1 Mobility     Pt scooted down in recliner upon arrival with large BM. VC for foot placement with assist on LLE and B foot blocking required. Attempted to SPT chair-bed with heavy max+2 but pt unable to come into upright position with several attempts. Slide transfer to bed with totalA +2. After transfer, pt rolled L/R with maxA for peri-care. Pt left in bed with alarm set, needs in reach.    Kathee Delton Mobility Specialist 11/27/22, 12:14 PM

## 2022-11-27 NOTE — Care Management Important Message (Signed)
Important Message  Patient Details  Name: Ashlee Fields MRN: [id number] Date of Birth: 02-07-1956   Medicare Important Message Given:  Yes     Juliann Pulse A Eniya Cannady 11/27/2022, 12:58 PM

## 2022-11-27 NOTE — TOC Progression Note (Signed)
Transition of Care [facility]) - Progression Note    Patient Details  Name: Ashlee Fields MRN: [id number] Date of Birth: 08-12-1956  Transition of [facility]) CM/SW Contact  Laurena Slimmer, RN Phone Number: 11/27/2022, 11:34 AM  Clinical Narrative:    Spoke with patient spouse regarding discharge plans. Patient is not agreeable to SNF per therapy recommendation. Patient spouse stated her last experience at a facility was unfavorable.  Patient was agreeable to [facility]. Patient or spouse do not have a preference. Her spouse was advised a manual WC was recommended. He refused the WC. He stated they have an electric wheelchair with a manual mode, in additions to a ramp grab bars and other equipment.   Referral sent and sent to [place] at [facility]. SOC will begin tomorrow or 11/29/2022 for PT/OT/ RN. [place] aide unavailable at this time. Patient spouse notified [facility] agency would be in direct contact with them   Referral for home oxygen, and nebulizer sent to [place] from [place]. Per Caryl Pina patient has been on services before. They will provide her with an oximeter. DME will be delivered today.  Patient spouse advised of HH, and oxygen. Spouse is unable to transport. She will need EMS transport.   MD and nurse notified of [place] arrangement, DME deliveries and transport home by EMS when medically stable.     MD and         Expected Discharge Plan and Services                                               Social Determinants of Health (SDOH) Interventions SDOH Screenings   Food Insecurity: No Food Insecurity (11/24/2022)  Housing: Low Risk  (11/24/2022)  Transportation Needs: No Transportation Needs (11/24/2022)  Utilities: Not At Risk (11/24/2022)  Tobacco Use: Medium Risk (11/24/2022)    Readmission Risk Interventions     No data to display

## 2022-11-27 NOTE — Progress Notes (Signed)
PROGRESS NOTE  Ashlee Fields    DOB: 11-19-1956, 67 y.o.  OYD:[id number]    Code Status: Full Code   DOA: 11/22/2022   LOS: 5   Brief hospital course  Ashlee Fields is a 67 y.o. female with a PMH significant for hypertension, hyperlipidemia, diabetes mellitus, COPD, GERD, hypothyroidism, depression with anxiety, OSA on CPAP, multiple sclerosis with left-sided weakness, pulmonary hypertension.  They presented from home to the ED on 11/22/2022 with SOB x 2 days with productive cough. She is not on oxygen at baseline  In the ED, it was found that they had severe respiratory distress, oxygen desaturation to 83% on room air, could not speak in full sentence, initially started 6 L/min oxygen, but due to ongoing respiratory distress was started on BiPAP in ED. Temperature normal, blood pressure 167/101, heart rate 96, RR 32.  Significant findings included Positive flu A PCR, lactic acid 4.5, 3.7, procalcitonin 46.62, BNP 426, troponin level 10, neutropenia with WBC 0.8 (neutrophils 13%, lymphocyte 61%).  Noted prior WBC 11.9 on 09/15/2022.  AKI with creatinine 1.41 (baseline creatinine 0.7 on 09/15/22). Chest x-ray showed possible right lower lobe and left lower lobe infiltration, concerning for pneumonia.   They were initially treated with empiric IV antibiotics for likely bacterial pneumonia secondary to influenza A infection.   12/31: remained on BiPAP with ongoing tachypnea and increased work of breathing this morning.  Tolerated two hour break this AM.  Feels improving overall.    1/1--1/3: pt improving slowly, still on oxygen. Positive blood culture with Strep pneumo, ID consulted. Recommended continued CTX and transition to levoquin when respiratory status improving to be discharged on until 1/9.  11/27/22 -stable, unchanged  Assessment & Plan  Principal Problem:   Influenza A Active Problems:   Acute on chronic respiratory failure with hypoxia (HCC)   Severe sepsis (HCC)    Bacteremia due to Streptococcus pneumoniae   COPD exacerbation (HCC)   AKI (acute kidney injury) ([place])   Diabetes mellitus without complication ([place])   Hypothyroidism   HTN (hypertension)   HLD (hyperlipidemia)   MS (multiple sclerosis) ([place])   Depression with anxiety   Obesity (BMI 30-39.9)   Hypokalemia   Normocytic anemia   Neutropenia associated with infection (HCC)  Influenza A Acute on chronic respiratory failure with hypoxia- no oxygen requirement at baseline, continues on 3L [place]. Has improved wheezing on exam still. - continue Tamiflu x 5 days - Supportive care per orders - scheduled inhalers - wean to room air as tolerated - incentive spirometer - continue steroid course - Recommend monitoring pulse ox with her home baseline activity prior to discharge to determine if home oxygen is required.    Bacteremia due to Streptococcus pneumoniae- pan-sensitive.  Admission blood culture positive for Strep pneumoniae. --Repeat Bcx ordered- NGTD --Infectious disease consulted - consider transition to PO Abx tomorrow to accommodate for patient's wishes to go home. Will need levoquin until 1/9, per ID. Will hold off on transition until improving clinically more.   Severe sepsis ([place]) POA with WBC 0.8, heart rate 96, RR 32, lactic acid 5.4, 3.7.  Procalcitonin 46.62.  Chest x-ray findings consistent with pneumonia.  Flu A positive. --Continue antibiotics --Follow cultures   COPD exacerbation (HCC) Due to Flu and pneumonia.  Continue neb treatments, supportive care O2 per protocol   AKI- Resolved. Likely due to dehydration and continuation Diovan -Resume home Diovan -Monitor BMP   Neutropenia (HCC)-resolved as of 11/24/2022 Likely due to acute infection and myelosuppression.  Hematology consulted on admission. Follow CBC   Diabetes mellitus without complication (HCC) Recent A1c 7.1, well controlled.   Patient is taking Victoza and metformin -Sliding scale insulin    Hypothyroidism -Synthroid    HTN (hypertension) -Resume Diovan, held due to AKI and sepsis which have resolved -Continue Zebeta   HLD (hyperlipidemia) -Lipitor    MS (multiple sclerosis) ([place]) Minimal mobility at baseline. She stands and transfers okay at home when healthy. Recommend monitoring pulse ox with her home baseline activity prior to discharge to determine if home oxygen is required.  Pt is on prednisone 10 mg daily.   Has chronic left-sided weakness -Now higher dose steroids as above - PT/OT recommend SNF but patient and spouse decline and will take her home. HH is ordered, TOC is engaged.   Depression with anxiety -Continue home medications    Obesity (BMI 30-39.9) Body mass index is 33.66 kg/m. Complicates overall care and prognosis.  Recommend lifestyle modifications including physical activity and diet for weight loss and overall long-term health.   Normocytic anemia- stable 11.7 >> 10.9 >> 9.2>9.6>9.9.  Per chart review was B12 deficient last year, now WNL.  No evidence of bleeding. --Anemia panel with AM labs --Monitor CBC   Hypokalemia Replaced on 11/24/22 for K 3.2>>>3.1 Monitor BMP, Mg level - replete PRN  Body mass index is 33.47 kg/m.  VTE ppx: enoxaparin (LOVENOX) injection 40 mg Start: 11/22/22 2200  Diet:     Diet   Diet heart healthy/carb modified Fluid consistency: Thin   Consultants: ID  Subjective 11/27/22    Pt reports improvement in her breathing. Continues to have productive cough. Her biggest concern is her anxiety for being in the hospital. Really wants to go home but is agreeable to staying in the hospital.   Objective   Vitals:   11/26/22 2000 11/27/22 0050 11/27/22 0248 11/27/22 0358  BP: (!) 169/92 (!) 159/92  (!) 147/78  Pulse: 86 80  81  Resp: _0 Temp: 97.7 F (36.5 C) 98.2 F (36.8 C)  97.8 F (36.6 C)  TempSrc: Oral     SpO2: 95% 97% 96% 97%  Weight:    85.7 kg  Height:        Intake/Output Summary  (Last 24 hours) at 11/27/2022 0742 Last data filed at 11/27/2022 0359 Gross per 24 hour  Intake 319.5 ml  Output 950 ml  Net -630.5 ml    Filed Weights   11/22/22 1117 11/27/22 0358  Weight: 86.2 kg 85.7 kg     Physical Exam:  General: awake, alert, NAD HEENT: atraumatic, clear conjunctiva, anicteric sclera, MMM, hearing grossly normal Respiratory: normal respiratory effort at rest. No conversational dyspnea. Mild scattered wheezes Cardiovascular: quick capillary refill, normal S1/S2, RRR, no JVD, murmurs Nervous: A&O x3. no gross focal neurologic deficits, normal speech Extremities: moves all equally, no edema, normal tone Skin: dry, intact, normal temperature, normal color. No rashes, lesions or ulcers on exposed skin Psychiatry: normal mood, congruent affect  Labs   I have personally reviewed the following labs and imaging studies CBC    Component Value Date/Time   WBC 9.3 11/27/2022 0452   RBC 3.29 (L) 11/27/2022 0452   HGB 9.9 (L) 11/27/2022 0452   HGB 13.3 10/07/2016 1657   HCT 29.9 (L) 11/27/2022 0452   HCT 38.8 10/07/2016 1657   PLT 201 11/27/2022 0452   PLT 340 10/07/2016 1657   MCV 90.9 11/27/2022 0452   MCV 89 10/07/2016 1657  MCH 30.1 11/27/2022 0452   MCHC 33.1 11/27/2022 0452   RDW 15.2 11/27/2022 0452   RDW 16.0 (H) 10/07/2016 1657   LYMPHSABS 0.2 (L) 11/23/2022 0448   LYMPHSABS 1.1 10/07/2016 1657   MONOABS 0.0 (L) 11/23/2022 0448   EOSABS 0.0 11/23/2022 0448   EOSABS 0.0 10/07/2016 1657   BASOSABS 0.1 11/23/2022 0448   BASOSABS 0.0 10/07/2016 1657      Latest Ref Rng & Units 11/27/2022    4:52 AM 11/26/2022    7:14 AM 11/25/2022    5:02 AM  BMP  Glucose 70 - 99 mg/dL 122  190  158   BUN 8 - 23 mg/dL _0 Creatinine 0.44 - 1.00 mg/dL 0.56  0.67  0.78   Sodium 135 - 145 mmol/L 139  140  140   Potassium 3.5 - 5.1 mmol/L 3.1  3.9  3.8   Chloride 98 - 111 mmol/L 102  104  104   CO2 22 - 32 mmol/L _1 Calcium 8.9 - 10.3 mg/dL 7.9  7.6   7.6     Disposition Plan & Communication  Patient status: Inpatient  Admitted From: Home Planned disposition location: Home health Anticipated discharge date: 1/5 pending stability and per patient wishes to go home. Will likely need home oxygen  Family Communication: husband at bedside    Author: Richarda Osmond, DO [facility] 11/27/2022, 7:42 AM   Available by Epic secure chat 7AM-7PM. If 7PM-7AM, please contact night-coverage.  [facility] contact information found on [email or web address].

## 2022-11-27 NOTE — Discharge Instructions (Addendum)
I'm glad you are feeling better today.  I still do not feel that you are in the stage of your healing yet to go home but I understand that you feel very strongly that you want to go home.  We have prescribed every medicine/equipment/and home service we have available that would be helpful to your healing.  Please continue your oxygen continuously.  Call your PCP prior to leaving the hospital and make a follow up appointment with them as soon as possible and within 5 days.    Please call [facility] when you get discharged.  They are providing her oxygen [phone]

## 2022-11-27 NOTE — Evaluation (Signed)
Physical Therapy Evaluation Patient Details Name: Ashlee Fields MRN: [id number] DOB: 02-13-1956 Today's Date: 11/27/2022  History of Present Illness  67 y/o female presented to ED on 11/12/22 for SOB and hypoxia. Found to be FluA+. Admitted for acute respiratory failure with hypoxia and sepsis 2/2 Flu A and Flu induced COPD exacerbation. PMH: HTN, T2DM, COPD, OSA, MS with L sided weakness, pulmonary HTN  Clinical Impression  Patient admitted with the above. PTA, patient lives with husband and he assists with all mobility and ADLs. Patient and husband report she being able to ambulate very short household distance (shuffled steps) reaching for walls/furniture and uses power w/c for community mobility. Patient presents with weakness, impaired balance, decreased activity tolerance, and impaired safety awareness and insight into deficits. Patient required maxA+2 for bed mobility and maxA+2 for sit to stand and stand pivot transfer to recliner. Extensive discussion with husband and patient regarding discharge recommendation and current +2 assistance level. Husband reports he will be able to assist her at home. Patient on 3L O2 during session to maintain spO2 >88%. Patient will benefit from skilled PT services during acute stay to address listed deficits. Recommend SNF at discharge to maximize functional mobility and decrease caregiver burden as patient does not have necessary +2 assistance at home.      Recommendations for follow up therapy are one component of a multi-disciplinary discharge planning process, led by the attending physician.  Recommendations may be updated based on patient status, additional functional criteria and insurance authorization.  Follow Up Recommendations Skilled nursing-short term rehab (<3 hours/day) Can patient physically be transported by private vehicle: No    Assistance Recommended at Discharge Frequent or constant Supervision/Assistance  Patient can return home with  the following  Two people to help with walking and/or transfers;Two people to help with bathing/dressing/bathroom;Assistance with cooking/housework;Direct supervision/assist for medications management;Direct supervision/assist for financial management;Assist for transportation;Help with stairs or ramp for entrance    Equipment Recommendations Wheelchair cushion (measurements PT);Wheelchair (measurements PT)  Recommendations for Other Services       Functional Status Assessment Patient has had a recent decline in their functional status and demonstrates the ability to make significant improvements in function in a reasonable and predictable amount of time.     Precautions / Restrictions Precautions Precautions: Fall Restrictions Weight Bearing Restrictions: No      Mobility  Bed Mobility Overal bed mobility: Needs Assistance Bed Mobility: Supine to Sit     Supine to sit: Max assist, +2 for physical assistance     General bed mobility comments: step by step vcs for technique    Transfers Overall transfer level: Needs assistance Equipment used: 2 person hand held assist Transfers: Sit to/from Stand, Bed to chair/wheelchair/BSC Sit to Stand: Max assist, +2 physical assistance, +2 safety/equipment Stand pivot transfers: Max assist, +2 physical assistance, +2 safety/equipment              Ambulation/Gait                  Stairs            Wheelchair Mobility    Modified Rankin (Stroke Patients Only)       Balance Overall balance assessment: Needs assistance Sitting-balance support: Feet supported Sitting balance-Leahy Scale: Poor Sitting balance - Comments: right and left latearl lean during static sitting   Standing balance support: Bilateral upper extremity supported, During functional activity Standing balance-Leahy Scale: Zero  Pertinent Vitals/Pain Pain Assessment Pain Assessment: Faces Faces Pain  Scale: Hurts a little bit Pain Location: generalized Pain Descriptors / Indicators: Discomfort Pain Intervention(s): Monitored during session    Home Living Family/patient expects to be discharged to:: Private residence Living Arrangements: Spouse/significant other Available Help at Discharge: Family;Available 24 hours/day Type of Home: House Home Access: Ramped entrance       Home Layout: One level (sunken level room with a ramp) Home Equipment: Rolling Lilianah Buffin (2 wheels);BSC/3in1;Grab bars - toilet;Grab bars - tub/shower;Wheelchair - power Additional Comments: adjutable sleep number bed, american standard tub (walk in)    Prior Function Prior Level of Function : Needs assist             Mobility Comments: short shuffling steps from bed>chair, utilizes pwc for most home/community distances ADLs Comments: grooming, UB dressing with MOD I, assist for LB dressing/bathing     Hand Dominance   Dominant Hand: Right    Extremity/Trunk Assessment   Upper Extremity Assessment Upper Extremity Assessment: Defer to OT evaluation LUE Deficits / Details: baseline LUE deficits- edema noted throughout L hand, minimal volitional shoulder/elbow movement noted; weak grip strength    Lower Extremity Assessment Lower Extremity Assessment: LLE deficits/detail LLE Deficits / Details: baseline LLE deficits       Communication   Communication: No difficulties  Cognition Arousal/Alertness: Awake/alert Behavior During Therapy: WFL for tasks assessed/performed, Restless Overall Cognitive Status: Within Functional Limits for tasks assessed Area of Impairment: Safety/judgement, Awareness, Problem solving                         Safety/Judgement: Decreased awareness of deficits Awareness: Emergent Problem Solving: Requires verbal cues, Requires tactile cues, Difficulty sequencing          General Comments General comments (skin integrity, edema, etc.): spo2 down to 86% on 2L  via [place] during bed mobility, appears 88% and above on 3 L via [place] throughout the rest of the evaluation    Exercises     Assessment/Plan    PT Assessment Patient needs continued PT services  PT Problem List Decreased strength;Decreased activity tolerance;Decreased balance;Decreased mobility;Decreased cognition;Decreased knowledge of use of DME;Decreased safety awareness;Decreased knowledge of precautions;Cardiopulmonary status limiting activity       PT Treatment Interventions DME instruction;Functional mobility training;Therapeutic activities;Therapeutic exercise;Balance training;Patient/family education    PT Goals (Current goals can be found in the Care Plan section)  Acute Rehab PT Goals Patient Stated Goal: to go home PT Goal Formulation: With patient/family Time For Goal Achievement: 12/11/22 Potential to Achieve Goals: Fair    Frequency Min 2X/week     Co-evaluation PT/OT/SLP Co-Evaluation/Treatment: Yes Reason for Co-Treatment: Necessary to address cognition/behavior during functional activity;For patient/therapist safety;Complexity of the patient's impairments (multi-system involvement);To address functional/ADL transfers PT goals addressed during session: Mobility/safety with mobility;Balance OT goals addressed during session: ADL's and self-care       AM-PAC PT "6 Clicks" Mobility  Outcome Measure Help needed turning from your back to your side while in a flat bed without using bedrails?: Total Help needed moving from lying on your back to sitting on the side of a flat bed without using bedrails?: Total Help needed moving to and from a bed to a chair (including a wheelchair)?: Total Help needed standing up from a chair using your arms (e.g., wheelchair or bedside chair)?: Total Help needed to walk in hospital room?: Total Help needed climbing 3-5 steps with a railing? : Total 6 Click Score: 6  End of Session Equipment Utilized During Treatment: Oxygen Activity  Tolerance: Patient limited by fatigue Patient left: in chair;with call bell/phone within reach;with chair alarm set;with family/visitor present Nurse Communication: Mobility status PT Visit Diagnosis: Muscle weakness (generalized) (M62.81);Unsteadiness on feet (R26.81);Other abnormalities of gait and mobility (R26.89);Difficulty in walking, not elsewhere classified (R26.2)    Time: 7615-1834 PT Time Calculation (min) (ACUTE ONLY): 27 min   Charges:   PT Evaluation $PT Eval Moderate Complexity: 1 Mod          Dalynn Jhaveri A. Gilford Rile PT, DPT [facility] - Acute Rehabilitation Services   Santiaga Butzin A Mychaela Lennartz 11/27/2022, 11:49 AM

## 2022-11-28 DIAGNOSIS — J101 Influenza due to other identified influenza virus with other respiratory manifestations: Secondary | ICD-10-CM | POA: Diagnosis not present

## 2022-11-28 DIAGNOSIS — R7881 Bacteremia: Secondary | ICD-10-CM | POA: Diagnosis not present

## 2022-11-28 DIAGNOSIS — D709 Neutropenia, unspecified: Secondary | ICD-10-CM | POA: Diagnosis not present

## 2022-11-28 DIAGNOSIS — J9601 Acute respiratory failure with hypoxia: Secondary | ICD-10-CM | POA: Diagnosis not present

## 2022-11-28 LAB — CBC
HCT: 31.2 % — ABNORMAL LOW (ref 36.0–46.0)
Hemoglobin: 10.3 g/dL — ABNORMAL LOW (ref 12.0–15.0)
MCH: 29.9 pg (ref 26.0–34.0)
MCHC: 33 g/dL (ref 30.0–36.0)
MCV: 90.4 fL (ref 80.0–100.0)
Platelets: 265 10*3/uL (ref 150–400)
RBC: 3.45 MIL/uL — ABNORMAL LOW (ref 3.87–5.11)
RDW: 14.9 % (ref 11.5–15.5)
WBC: 13.4 10*3/uL — ABNORMAL HIGH (ref 4.0–10.5)
nRBC: 0.2 % (ref 0.0–0.2)

## 2022-11-28 LAB — BASIC METABOLIC PANEL
Anion gap: 8 (ref 5–15)
BUN: 13 mg/dL (ref 8–23)
CO2: 25 mmol/L (ref 22–32)
Calcium: 7.7 mg/dL — ABNORMAL LOW (ref 8.9–10.3)
Chloride: 102 mmol/L (ref 98–111)
Creatinine, Ser: 0.49 mg/dL (ref 0.44–1.00)
GFR, Estimated: >60 mL/min (ref >60)
Glucose, Bld: 92 mg/dL (ref 70–99)
Potassium: 3.5 mmol/L (ref 3.5–5.1)
Sodium: 135 mmol/L (ref 135–145)

## 2022-11-28 LAB — GLUCOSE, CAPILLARY
Glucose-Capillary: 106 mg/dL — ABNORMAL HIGH (ref 70–99)
Glucose-Capillary: 183 mg/dL — ABNORMAL HIGH (ref 70–99)
Glucose-Capillary: 183 mg/dL — ABNORMAL HIGH (ref 70–99)

## 2022-11-28 NOTE — Progress Notes (Signed)
Physical Therapy Treatment Patient Details Name: Ashlee Fields MRN: [id number] DOB: 05/09/1956 Today's Date: 11/28/2022   History of Present Illness 67 y/o female presented to ED on 11/12/22 for SOB and hypoxia. Found to be FluA+. Admitted for acute respiratory failure with hypoxia and sepsis 2/2 Flu A and Flu induced COPD exacerbation. PMH: HTN, T2DM, COPD, OSA, MS with L sided weakness, pulmonary HTN    PT Comments    Patient seen in conjunction with OT to maximize activity tolerance and family education/training. Husband, Hedy Camara, present throughout session. Patient demonstrates poor awareness into safety, deficits, and current situation. She required maxA+2 for bed mobility and up to minA to maintain sitting balance at EOB due to R and L lateral lean. Encouraged husband to participate in transfer to w/c, however husband unable to adequately assist to transfer patient to w/c. Patient requires +2 skilled assist to attempt any functional transfers at this time. Discussed with patient and husband on discharge recommendation, current level of assistance required, and use of hoyer lift for safe functional transfers OOB at home. Continue to recommend SNF for ongoing Physical Therapy, however husband adamant about returning home. Will need max HH services (PT/OT/aide) at discharge and hoyer lift for safe transfers at home.    Recommendations for follow up therapy are one component of a multi-disciplinary discharge planning process, led by the attending physician.  Recommendations may be updated based on patient status, additional functional criteria and insurance authorization.  Follow Up Recommendations  Skilled nursing-short term rehab (<3 hours/day) Can patient physically be transported by private vehicle: No   Assistance Recommended at Discharge Frequent or constant Supervision/Assistance  Patient can return home with the following Two people to help with walking and/or transfers;Two people to help  with bathing/dressing/bathroom;Assistance with cooking/housework;Direct supervision/assist for medications management;Direct supervision/assist for financial management;Assist for transportation;Help with stairs or ramp for entrance   Equipment Recommendations  Wheelchair cushion (measurements PT);Wheelchair (measurements PT);Other (comment) (hoyer lift)    Recommendations for Other Services       Precautions / Restrictions Precautions Precautions: Fall Restrictions Weight Bearing Restrictions: No     Mobility  Bed Mobility Overal bed mobility: Needs Assistance Bed Mobility: Supine to Sit, Sit to Supine     Supine to sit: Max assist, +2 for physical assistance Sit to supine: Max assist, +2 for physical assistance        Transfers Overall transfer level: Needs assistance Equipment used: 2 person hand held assist Transfers: Sit to/from Stand Sit to Stand: Max assist, +2 physical assistance, +2 safety/equipment           General transfer comment: step by step muli modal cues for safety. Attempted to allow husband to assist with transfer but patient and husband unable to complete transfer due to patient requiring heavy +2 assistance. Therapists standing close by for safety but husband does not demonstrate ability to complete transfers.    Ambulation/Gait                   Stairs             Wheelchair Mobility    Modified Rankin (Stroke Patients Only)       Balance Overall balance assessment: Needs assistance Sitting-balance support: Feet supported Sitting balance-Leahy Scale: Poor Sitting balance - Comments: requires at least CGA-MIN A for static sitting balance Postural control: Right lateral lean, Left lateral lean Standing balance support: Bilateral upper extremity supported, During functional activity Standing balance-Leahy Scale: Zero  Cognition Arousal/Alertness: Awake/alert Behavior During  Therapy: WFL for tasks assessed/performed, Restless Overall Cognitive Status: Impaired/Different from baseline Area of Impairment: Safety/judgement, Problem solving                         Safety/Judgement: Decreased awareness of deficits Awareness: Emergent Problem Solving: Requires verbal cues, Requires tactile cues, Difficulty sequencing General Comments: pleasant and agreeable, poor awareness of current level of deficits        Exercises      General Comments General comments (skin integrity, edema, etc.): spo2 appears >90% throughout on 3 L via [place]      Pertinent Vitals/Pain Pain Assessment Pain Assessment: No/denies pain    Home Living                          Prior Function            PT Goals (current goals can now be found in the care plan section) Acute Rehab PT Goals Patient Stated Goal: to go home PT Goal Formulation: With patient/family Time For Goal Achievement: 12/11/22 Potential to Achieve Goals: Fair Progress towards PT goals: Not progressing toward goals - comment    Frequency    Min 2X/week      PT Plan Current plan remains appropriate    Co-evaluation   Reason for Co-Treatment: Complexity of the patient's impairments (multi-system involvement);Necessary to address cognition/behavior during functional activity;For patient/therapist safety;To address functional/ADL transfers   OT goals addressed during session: ADL's and self-care      AM-PAC PT "6 Clicks" Mobility   Outcome Measure  Help needed turning from your back to your side while in a flat bed without using bedrails?: Total Help needed moving from lying on your back to sitting on the side of a flat bed without using bedrails?: Total Help needed moving to and from a bed to a chair (including a wheelchair)?: Total Help needed standing up from a chair using your arms (e.g., wheelchair or bedside chair)?: Total Help needed to walk in hospital room?: Total Help  needed climbing 3-5 steps with a railing? : Total 6 Click Score: 6    End of Session Equipment Utilized During Treatment: Oxygen Activity Tolerance: Patient limited by fatigue Patient left: in bed;with call bell/phone within reach;with bed alarm set;with family/visitor present Nurse Communication: Mobility status PT Visit Diagnosis: Muscle weakness (generalized) (M62.81);Unsteadiness on feet (R26.81);Other abnormalities of gait and mobility (R26.89);Difficulty in walking, not elsewhere classified (R26.2)     Time: 5573-2202 PT Time Calculation (min) (ACUTE ONLY): 30 min  Charges:  $Therapeutic Activity: 8-22 mins                     Clide Remmers A. Gilford Rile PT, DPT [facility] 11/28/2022, 12:50 PM

## 2022-11-28 NOTE — Discharge Summary (Signed)
Physician Discharge Summary  Patient: TANYIKA BARROS WJX:[id number] DOB: 1956-08-18   Code Status: Full Code Admit date: 11/22/2022 Discharge date: 11/28/2022 Disposition: Home, PT, OT, nurse aid, and RN PCP: [name], MD  Recommendations for Outpatient Follow-up:  Follow up with PCP within 1 week Regarding general hospital follow up and preventative care Of note, patient was in my opinion NOT medically stable for discharge home. She was discharged at the insistence of her and her spouse to go home. Myself, nursing staff, social work, physical therapy, and occupational therapy ALL recommended she remain hospitalized and discharge to SNF when medically ready. She is at VERY high risk of returning to hospital in short time frame with worsening of condition. We have done our best to optimize her success at home with home health services and equipment as well as very close follow up confirmed prior to discharge.   Discharge Diagnoses:  Principal Problem:   Influenza A Active Problems:   Acute on chronic respiratory failure with hypoxia (HCC)   Severe sepsis (HCC)   Bacteremia due to Streptococcus pneumoniae   COPD exacerbation (HCC)   AKI (acute kidney injury) ([place])   Diabetes mellitus without complication (HCC)   Hypothyroidism   HTN (hypertension)   HLD (hyperlipidemia)   MS (multiple sclerosis) ([place])   Depression with anxiety   Obesity (BMI 30-39.9)   Hypokalemia   Normocytic anemia   Neutropenia associated with infection [facility])  Brief Hospital Course Summary: SHALENA EZZELL is a 67 y.o. female with a PMH significant for hypertension, hyperlipidemia, diabetes mellitus, COPD, GERD, hypothyroidism, depression with anxiety, OSA on CPAP, multiple sclerosis with left-sided paralysis, pulmonary hypertension.   They presented from home to the ED on 11/22/2022 with SOB x 2 days with productive cough. She is not on oxygen at baseline.   In the ED, it was found that they had  severe respiratory distress, oxygen desaturation to 83% on room air, could not speak in full sentences, initially started 6 L/min oxygen, but due to ongoing respiratory distress was started on BiPAP in ED. Temperature normal, blood pressure 167/101, heart rate 96, RR 32.  Significant findings included Positive flu A PCR, lactic acid 4.5, 3.7, procalcitonin 46.62, BNP 426, troponin level 10, neutropenia with WBC 0.8 (neutrophils 13%, lymphocyte 61%). AKI with creatinine 1.41 (baseline creatinine 0.7 on 09/15/22). Chest x-ray showed possible right lower lobe and left lower lobe infiltration, concerning for pneumonia.    They were initially treated with empiric IV antibiotics for likely bacterial pneumonia in addition to influenza A infection.    12/31: remained on BiPAP with ongoing tachypnea and increased work of breathing this morning.  Tolerated two hour break this AM.  Feels improving overall.    1/1--1/3: pt improving slowly, still on 3L oxygen [place]. Positive blood culture with Strep pneumo, ID consulted. Recommended continued IV CTX and transition to levoquin when respiratory status improving to be completed on until 1/9.  1/4- patient showed no further improvement in respiratory status but insistent to go home against medical advice. Home oxygen could not be delivered until next day so she agreed to stay overnight.   1/5- patient again, showed no further improvement. Remains on 3L [place] and is so weak that she cannot get herself up in bed and required multiple people to assist when getting to bedside commode. Again, evaluated by PT/OT who recommend SNF at dc but patient and husband declined and will be going home today. Home oxygen delivered, confirmed a  PCP f/u scheduled within 5 days. Her PO Abx (levoquin) was prescribed to continue until 1/9 as recommended by ID. [place] lift, home nebulizer, and home oxygen ordered.   All other chronic conditions were treated with home medications.   Discharge  Condition: Fair, improved Recommended discharge diet: Regular healthy diet  Consultations: ID  Procedures/Studies: Bipap   Discharge Instructions     Discharge patient   Complete by: As directed    Discharge disposition: 01-Home or Self Care   Discharge patient date: 11/28/2022      Allergies as of 11/28/2022       Reactions   Penicillins Hives   Did it involve swelling of the face/tongue/throat, SOB, or low BP? No Did it involve sudden or severe rash/hives, skin peeling, or any reaction on the inside of your mouth or nose? Yes Did you need to seek medical attention at a hospital or doctor's office? Unknown When did it last happen?      20 + years If all above answers are "NO", may proceed with cephalosporin use.   Other Hives   succinylsulfathiazole   Ibuprofen Rash   Sulfa Antibiotics Hives, Nausea And Vomiting, Itching, Rash        Medication List     STOP taking these medications    famotidine 40 MG tablet Commonly known as: PEPCID       TAKE these medications    acetaminophen 500 MG tablet Commonly known as: TYLENOL Take 2 tablets (1,000 mg total) by mouth 3 (three) times daily.   ascorbic acid 500 MG tablet Commonly known as: VITAMIN C Take 500 mg by mouth daily.   atorvastatin 80 MG tablet Commonly known as: LIPITOR Take 80 mg by mouth at bedtime.   bisoprolol 10 MG tablet Commonly known as: ZEBETA Take 10 mg by mouth daily.   calcium-vitamin D 500-200 MG-UNIT tablet Commonly known as: OSCAL WITH D Take 1 tablet by mouth 2 (two) times daily. With meals   cyanocobalamin 1000 MCG/ML injection Commonly known as: VITAMIN B12 Inject 1,000 mcg into the muscle every 30 (thirty) days.   diazepam 2 MG tablet Commonly known as: VALIUM Take 2 mg by mouth at bedtime as needed for muscle spasms.   FLUoxetine 20 MG capsule Commonly known as: PROZAC Take 60 mg by mouth daily.   hydrochlorothiazide 25 MG tablet Commonly known as: HYDRODIURIL Take  25 mg by mouth in the morning.   ipratropium-albuterol 0.5-2.5 (3) MG/3ML Soln Commonly known as: DUONEB Take 3 mLs by nebulization every 6 (six) hours as needed.   levofloxacin 750 MG tablet Commonly known as: Levaquin Take 1 tablet (750 mg total) by mouth daily for 5 days.   levothyroxine 50 MCG tablet Commonly known as: SYNTHROID Take 50 mcg by mouth daily before breakfast.   LORazepam 1 MG tablet Commonly known as: ATIVAN Take 1 mg by mouth 2 (two) times daily.   metFORMIN 500 MG 24 hr tablet Commonly known as: GLUCOPHAGE-XR Take 1,500 mg by mouth every evening.   methylphenidate 10 MG tablet Commonly known as: RITALIN Take 40 mg by mouth daily.   multivitamin with minerals tablet Take 1 tablet by mouth daily.   ondansetron 4 MG disintegrating tablet Commonly known as: ZOFRAN-ODT Take 4 mg by mouth every 8 (eight) hours as needed for nausea or vomiting.   Ozempic (0.25 or 0.5 MG/DOSE) 2 MG/3ML Sopn Generic drug: Semaglutide(0.25 or 0.5MG/DOS) Inject 0.5 mg into the skin every Wednesday.   pantoprazole 40 MG tablet Commonly  known as: PROTONIX Take 40 mg by mouth 2 (two) times daily.   Phendimetrazine Tartrate 35 MG Tabs Take 35 mg by mouth 3 (three) times daily.   predniSONE 10 MG tablet Commonly known as: DELTASONE Take 10 mg by mouth daily.   traMADol 50 MG tablet Commonly known as: ULTRAM Take 50-100 mg by mouth every 8 (eight) hours as needed for moderate pain.   valsartan-hydrochlorothiazide 320-12.5 MG tablet Commonly known as: DIOVAN-HCT Take 1 tablet by mouth at bedtime.               Durable Medical Equipment  (From admission, onward)           Start     Ordered   11/28/22 1205  For home use only DME Other see comment  Once       Comments: Harrel Lemon lift  Question:  Length of Need  Answer:  Lifetime   11/28/22 1205   11/27/22 1210  For home use only DME Pulse oximeter  Once        11/27/22 1209   11/27/22 1038  For home use only  DME standard manual wheelchair with seat cushion  Once       Comments: Patient suffers from [place] which impairs their ability to perform daily activities like bathing, dressing, feeding, grooming, and toileting in the home.  A cane, crutch, or walker will not resolve issue with performing activities of daily living. A wheelchair will allow patient to safely perform daily activities. Patient can safely propel the wheelchair in the home or has a caregiver who can provide assistance. Length of need Lifetime. Accessories: elevating leg rests (ELRs), wheel locks, extensions and anti-tippers.   11/27/22 1038   11/27/22 1037  For home use only DME Nebulizer/meds  Once       Question Answer Comment  Patient needs a nebulizer to treat with the following condition CAP (community acquired pneumonia)   Length of Need 6 Months      11/27/22 1036   11/27/22 1037  For home use only DME Nebulizer machine  Once       Question Answer Comment  Patient needs a nebulizer to treat with the following condition CAP (community acquired pneumonia)   Length of Need 6 Months      11/27/22 1036   11/27/22 1034  For home use only DME oxygen  Once       Question Answer Comment  Length of Need 6 Months   Mode or (Route) Nasal cannula   Liters per Minute 3   Frequency Continuous (stationary and portable oxygen unit needed)   Oxygen conserving device Yes   Oxygen delivery system Gas      11/27/22 1034            Follow-up Information     Mortimer Fries, PA-C. Go on 12/03/2022.   Specialty: Physician Assistant Why: 12/03/22 at 11am Contact information: [place] [id number] [phone]                 Subjective   Pt reports feeling improved since admission. Continues to have cough and shortness of breath but states that she feels no dyspnea at rest. She has had 2 Bms today. Denies abdominal pain or dysuria. She and her spouse are determined to go home  today. They expressed understanding that this decision is against my advice and that of therapy.   All questions and concerns were addressed at time of discharge.  Objective  Blood pressure (!) 168/106, pulse 88, temperature 97.6 F (36.4 C), resp. rate 16, height _0  (1.6 m), weight 85.5 kg, SpO2 96 %.   General: Pt is alert, awake, not in acute distress Cardiovascular: RRR, S1/S2 +, no rubs, no gallops Respiratory: CTA bilaterally, mild scattered wheezing, no rhonchi Abdominal: Soft, NT, ND, bowel sounds + Extremities: no edema, no cyanosis  The results of significant diagnostics from this hospitalization (including imaging, microbiology, ancillary and laboratory) are listed below for reference.   Imaging studies: ECHOCARDIOGRAM COMPLETE  Result Date: 11/26/2022    ECHOCARDIOGRAM REPORT   Patient Name:   ANITHA KREISER Date of Exam: 11/26/2022 Medical Rec #:  [id number]       Height:       63.0 in Accession #:    [id number]      Weight:       190.0 lb Date of Birth:  [birth year]      BSA:          1.892 m Patient Age:    63 years        BP:           174/83 mmHg Patient Gender: F               HR:           84 bpm. Exam Location:  [facility] Procedure: 2D Echo, Cardiac Doppler and Color Doppler Indications:     Bacteremia  History:         Patient has no prior history of Echocardiogram examinations.                  Pulmonary HTN and COPD, Signs/Symptoms:Bacteremia and Shortness                  of Breath; Risk Factors:Hypertension, Diabetes, Dyslipidemia                  and Current Smoker. Multiple Sclerosis, Flu +.  Sonographer:     Wenda Low Referring Phys:  [id number] Tsosie Billing Diagnosing Phys: Ida Rogue MD  Sonographer Comments: Patient is obese. Image acquisition challenging due to respiratory motion. IMPRESSIONS  1. Left ventricular ejection fraction, by estimation, is 45 to 50%. The left ventricle has mildly decreased function. The left ventricle demonstrates regional wall  motion abnormalities (hypokinesis of the basal to mid septal wall). Left ventricular diastolic parameters are indeterminate.  2. Right ventricular systolic function is normal. The right ventricular size is normal. There is severely elevated pulmonary artery systolic pressure. The estimated right ventricular systolic pressure is 19.5 mmHg.  3. The mitral valve is normal in structure,leaflets are mildly thickened with mild calcification. Moderate mitral valve regurgitation. No evidence of mitral stenosis.  4. Tricuspid valve regurgitation is moderate.  5. The aortic valve has an indeterminant number of cusps. There is mild calcification of the aortic valve. Aortic valve regurgitation is not visualized. Aortic valve sclerosis is present, with no evidence of aortic valve stenosis.  6. The inferior vena cava is normal in size with greater than 50% respiratory variability, suggesting right atrial pressure of 3 mmHg. FINDINGS  Left Ventricle: Left ventricular ejection fraction, by estimation, is 45 to 50%. The left ventricle has mildly decreased function. The left ventricle demonstrates regional wall motion abnormalities. The left ventricular internal cavity size was normal in size. There is no left ventricular hypertrophy. Left ventricular diastolic parameters are indeterminate. Right Ventricle: The right ventricular size is normal. No increase in right  ventricular wall thickness. Right ventricular systolic function is normal. There is severely elevated pulmonary artery systolic pressure. The tricuspid regurgitant velocity is 3.36 m/s, and with an assumed right atrial pressure of 15 mmHg, the estimated right ventricular systolic pressure is 49.4 mmHg. Left Atrium: Left atrial size was normal in size. Right Atrium: Right atrial size was normal in size. Pericardium: There is no evidence of pericardial effusion. Mitral Valve: The mitral valve is normal in structure. There is mild thickening of the mitral valve leaflet(s).  There is mild calcification of the mitral valve leaflet(s). Moderate mitral valve regurgitation. No evidence of mitral valve stenosis. MV peak  gradient, 4.8 mmHg. The mean mitral valve gradient is 2.0 mmHg. Tricuspid Valve: The tricuspid valve is normal in structure. Tricuspid valve regurgitation is moderate . No evidence of tricuspid stenosis. Aortic Valve: The aortic valve has an indeterminant number of cusps. There is mild calcification of the aortic valve. Aortic valve regurgitation is not visualized. Aortic valve sclerosis is present, with no evidence of aortic valve stenosis. Aortic valve  mean gradient measures 3.0 mmHg. Aortic valve peak gradient measures 5.9 mmHg. Aortic valve area, by VTI measures 2.89 cm. Pulmonic Valve: The pulmonic valve was normal in structure. Pulmonic valve regurgitation is trivial. No evidence of pulmonic stenosis. Aorta: The aortic root is normal in size and structure. Venous: The inferior vena cava is normal in size with greater than 50% respiratory variability, suggesting right atrial pressure of 3 mmHg. IAS/Shunts: The interatrial septum appears to be lipomatous. No atrial level shunt detected by color flow Doppler.  LEFT VENTRICLE PLAX 2D LVIDd:         4.20 cm   Diastology LVIDs:         3.10 cm   LV e' medial:    11.70 cm/s LV PW:         1.20 cm   LV E/e' medial:  8.6 LV IVS:        1.10 cm   LV e' lateral:   10.20 cm/s LVOT diam:     2.00 cm   LV E/e' lateral: 9.9 LV SV:         63 LV SV Index:   33 LVOT Area:     3.14 cm  RIGHT VENTRICLE RV Basal diam:  3.55 cm RV Mid diam:    3.00 cm RV S prime:     17.00 cm/s TAPSE (M-mode): 2.4 cm LEFT ATRIUM             Index        RIGHT ATRIUM           Index LA diam:        3.70 cm 1.96 cm/m   RA Area:     14.00 cm LA Vol (A2C):   42.8 ml 22.62 ml/m  RA Volume:   31.50 ml  16.65 ml/m LA Vol (A4C):   40.1 ml 21.20 ml/m LA Biplane Vol: 42.9 ml 22.68 ml/m  AORTIC VALVE                    PULMONIC VALVE AV Area (Vmax):    2.32  cm     PV Vmax:       0.90 m/s AV Area (Vmean):   2.73 cm     PV Peak grad:  3.3 mmHg AV Area (VTI):     2.89 cm AV Vmax:           121.00 cm/s AV Vmean:  71.600 cm/s AV VTI:            0.216 m AV Peak Grad:      5.9 mmHg AV Mean Grad:      3.0 mmHg LVOT Vmax:         89.40 cm/s LVOT Vmean:        62.300 cm/s LVOT VTI:          0.199 m LVOT/AV VTI ratio: 0.92  AORTA Ao Root diam: 3.10 cm MITRAL VALVE                  TRICUSPID VALVE MV Area (PHT): 5.16 cm       TR Peak grad:   45.2 mmHg MV Area VTI:   2.28 cm       TR Vmax:        336.00 cm/s MV Peak grad:  4.8 mmHg MV Mean grad:  2.0 mmHg       SHUNTS MV Vmax:       1.09 m/s       Systemic VTI:  0.20 m MV Vmean:      65.0 cm/s      Systemic Diam: 2.00 cm MV Decel Time: 147 msec MR Peak grad:    170.0 mmHg MR Mean grad:    113.0 mmHg MR Vmax:         652.00 cm/s MR Vmean:        501.0 cm/s MR PISA:         1.01 cm MR PISA Eff ROA: 14 mm MR PISA Radius:  0.40 cm MV E velocity: 101.00 cm/s MV A velocity: 94.30 cm/s MV E/A ratio:  1.07 Ida Rogue MD Electronically signed by Ida Rogue MD Signature Date/Time: 11/26/2022/12:51:37 PM    Final (Updated)    DG Chest Port 1 View  Result Date: 11/22/2022 CLINICAL DATA:  Shortness of breath. EXAM: PORTABLE CHEST 1 VIEW COMPARISON:  AP chest 02/29/2020, chest and left rib radiographs 07/13/2016; CT chest 01/22/2017 FINDINGS: Cardiac silhouette is again mildly enlarged. Mediastinal contours are within normal limits. There is new right lower greater than mid lung and possible minimal left lower lung heterogeneous airspace opacification. No definite pleural effusion. No pneumothorax. 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 IMPRESSION: New right lower greater than mid lung and possible minimal left lower lung heterogeneous airspace opacification. This could be due to pneumonia or asymmetric pulmonary edema. Electronically Signed   By:  Yvonne Kendall M.D.   On: 11/22/2022 11:51    Labs: Basic Metabolic Panel: Recent Labs  Lab 11/24/22 0357 11/25/22 0502 11/26/22 0714 11/27/22 0452 11/28/22 0512  NA 140 140 140 139 135  K 3.2* 3.8 3.9 3.1* 3.5  CL 107 104 104 102 102  CO2 _0 GLUCOSE 132* 158* 190* 122* 92  BUN 20 24* _1 CREATININE 0.70 0.78 0.67 0.56 0.49  CALCIUM 7.5* 7.6* 7.6* 7.9* 7.7*  MG 2.2  --   --   --   --    CBC: Recent Labs  Lab 11/22/22 1129 11/23/22 0448 11/24/22 0357 11/25/22 0502 11/26/22 0714 11/27/22 0452 11/28/22 0512  WBC 0.8* 2.5* 6.2 6.8 7.1 9.3 13.4*  NEUTROABS 0.2* 2.2  --   --   --   --   --   HGB 13.6 11.7* 10.9* 9.2* 9.6* 9.9* 10.3*  HCT 43.3 36.9 34.7* 29.3* 30.3* 29.9* 31.2*  MCV 93.9 94.1 93.8  94.2 93.2 90.9 90.4  PLT 288 212 176 154 167 201 265   Microbiology: Results for orders placed or performed during the hospital encounter of 11/22/22  Resp panel by RT-PCR (RSV, Flu A&B, Covid) Anterior Nasal Swab     Status: Abnormal   Collection Time: 11/22/22 11:29 AM   Specimen: Anterior Nasal Swab  Result Value Ref Range Status   SARS Coronavirus 2 by RT PCR NEGATIVE NEGATIVE Final    Comment: (NOTE) SARS-CoV-2 target nucleic acids are NOT DETECTED.  The SARS-CoV-2 RNA is generally detectable in upper respiratory specimens during the acute phase of infection. The lowest concentration of SARS-CoV-2 viral copies this assay can detect is 138 copies/mL. A negative result does not preclude SARS-Cov-2 infection and should not be used as the sole basis for treatment or other patient management decisions. A negative result may occur with  improper specimen collection/handling, submission of specimen other than nasopharyngeal swab, presence of viral mutation(s) within the areas targeted by this assay, and inadequate number of viral copies(<138 copies/mL). A negative result must be combined with clinical observations, patient history, and  epidemiological information. The expected result is Negative.  Fact Sheet for Patients:  EntrepreneurPulse.com.au  Fact Sheet for Healthcare Providers:  IncredibleEmployment.be  This test is no t yet approved or cleared by the [place] FDA and  has been authorized for detection and/or diagnosis of SARS-CoV-2 by FDA under an Emergency Use Authorization (EUA). This EUA will remain  in effect (meaning this test can be used) for the duration of the COVID-19 declaration under Section 564(b)(1) of the Act, 21 U.S.C.section 360bbb-3(b)(1), unless the authorization is terminated  or revoked sooner.       Influenza A by PCR POSITIVE (A) NEGATIVE Final   Influenza B by PCR NEGATIVE NEGATIVE Final    Comment: (NOTE) The Xpert Xpress SARS-CoV-2/FLU/RSV plus assay is intended as an aid in the diagnosis of influenza from Nasopharyngeal swab specimens and should not be used as a sole basis for treatment. Nasal washings and aspirates are unacceptable for Xpert Xpress SARS-CoV-2/FLU/RSV testing.  Fact Sheet for Patients: EntrepreneurPulse.com.au  Fact Sheet for Healthcare Providers: IncredibleEmployment.be  This test is not yet approved or cleared by the [place] FDA and has been authorized for detection and/or diagnosis of SARS-CoV-2 by FDA under an Emergency Use Authorization (EUA). This EUA will remain in effect (meaning this test can be used) for the duration of the COVID-19 declaration under Section 564(b)(1) of the Act, 21 U.S.C. section 360bbb-3(b)(1), unless the authorization is terminated or revoked.     Resp Syncytial Virus by PCR NEGATIVE NEGATIVE Final    Comment: (NOTE) Fact Sheet for Patients: EntrepreneurPulse.com.au  Fact Sheet for Healthcare Providers: IncredibleEmployment.be  This test is not yet approved or cleared by the [place] FDA and has  been authorized for detection and/or diagnosis of SARS-CoV-2 by FDA under an Emergency Use Authorization (EUA). This EUA will remain in effect (meaning this test can be used) for the duration of the COVID-19 declaration under Section 564(b)(1) of the Act, 21 U.S.C. section 360bbb-3(b)(1), unless the authorization is terminated or revoked.  Performed at [facility], [address] [place], [place] [address]   Culture, blood (routine x 2)     Status: None   Collection Time: 11/22/22  1:13 PM   Specimen: BLOOD  Result Value Ref Range Status   Specimen Description BLOOD BLOOD RIGHT HAND  Final   Special Requests   Final    BOTTLES DRAWN  AEROBIC AND ANAEROBIC Blood Culture adequate volume   Culture   Final    NO GROWTH 5 DAYS Performed at [facility], [address] [place], [place] [address]    Report Status 11/27/2022 FINAL  Final  Culture, blood (routine x 2)     Status: Abnormal   Collection Time: 11/22/22  1:13 PM   Specimen: BLOOD  Result Value Ref Range Status   Specimen Description   Final    BLOOD RIGHT ANTECUBITAL Performed at [facility], [address] [place], [place] [address]    Special Requests   Final    BOTTLES DRAWN AEROBIC AND ANAEROBIC Blood Culture adequate volume Performed at [facility], [address] [place], [place] [address]    Culture  Setup Time   Final    GRAM POSITIVE COCCI IN BOTH AEROBIC AND ANAEROBIC BOTTLES CRITICAL RESULT CALLED TO, READ BACK BY AND VERIFIED WITH: NATHAN BELUE @ 0302 11/23/22 LFD Performed at [facility], [place] [address] [place], [place] [address]    Culture STREPTOCOCCUS PNEUMONIAE (A)  Final   Report Status 11/25/2022 FINAL  Final   Organism ID, Bacteria STREPTOCOCCUS PNEUMONIAE  Final      Susceptibility   Streptococcus pneumoniae - MIC*    ERYTHROMYCIN <=0.12 SENSITIVE Sensitive     LEVOFLOXACIN 0.5 SENSITIVE Sensitive     VANCOMYCIN <=0.12 SENSITIVE Sensitive      PENICILLIN (meningitis) <=0.06 SENSITIVE Sensitive     PENO - penicillin <=0.06      PENICILLIN (non-meningitis) <=0.06 SENSITIVE Sensitive     PENICILLIN (oral) <=0.06 SENSITIVE Sensitive     CEFTRIAXONE (non-meningitis) <=0.12 SENSITIVE Sensitive     CEFTRIAXONE (meningitis) <=0.12 SENSITIVE Sensitive     * STREPTOCOCCUS PNEUMONIAE  Blood Culture ID Panel (Reflexed)     Status: Abnormal   Collection Time: 11/22/22  1:13 PM  Result Value Ref Range Status   Enterococcus faecalis NOT DETECTED NOT DETECTED Final   Enterococcus Faecium NOT DETECTED NOT DETECTED Final   Listeria monocytogenes NOT DETECTED NOT DETECTED Final   Staphylococcus species NOT DETECTED NOT DETECTED Final   Staphylococcus aureus (BCID) NOT DETECTED NOT DETECTED Final   Staphylococcus epidermidis NOT DETECTED NOT DETECTED Final   Staphylococcus lugdunensis NOT DETECTED NOT DETECTED Final   Streptococcus species DETECTED (A) NOT DETECTED Final    Comment: CRITICAL RESULT CALLED TO, READ BACK BY AND VERIFIED WITH: NATHAN BELUE @ 0302 11/23/22 LFD    Streptococcus agalactiae NOT DETECTED NOT DETECTED Final   Streptococcus pneumoniae DETECTED (A) NOT DETECTED Final    Comment: CRITICAL RESULT CALLED TO, READ BACK BY AND VERIFIED WITH: NATHAN BELUE@ 0302 11/23/22 LFD    Streptococcus pyogenes NOT DETECTED NOT DETECTED Final   A.calcoaceticus-baumannii NOT DETECTED NOT DETECTED Final   Bacteroides fragilis NOT DETECTED NOT DETECTED Final   Enterobacterales NOT DETECTED NOT DETECTED Final   Enterobacter cloacae complex NOT DETECTED NOT DETECTED Final   Escherichia coli NOT DETECTED NOT DETECTED Final   Klebsiella aerogenes NOT DETECTED NOT DETECTED Final   Klebsiella oxytoca NOT DETECTED NOT DETECTED Final   Klebsiella pneumoniae NOT DETECTED NOT DETECTED Final   Proteus species NOT DETECTED NOT DETECTED Final   Salmonella species NOT DETECTED NOT DETECTED Final   Serratia marcescens NOT DETECTED NOT DETECTED  Final   Haemophilus influenzae NOT DETECTED NOT DETECTED Final   Neisseria meningitidis NOT DETECTED NOT DETECTED Final   Pseudomonas aeruginosa NOT DETECTED NOT DETECTED Final   Stenotrophomonas maltophilia NOT  DETECTED NOT DETECTED Final   Candida albicans NOT DETECTED NOT DETECTED Final   Candida auris NOT DETECTED NOT DETECTED Final   Candida glabrata NOT DETECTED NOT DETECTED Final   Candida krusei NOT DETECTED NOT DETECTED Final   Candida parapsilosis NOT DETECTED NOT DETECTED Final   Candida tropicalis NOT DETECTED NOT DETECTED Final   Cryptococcus neoformans/gattii NOT DETECTED NOT DETECTED Final    Comment: Performed at [facility], [address] [place], [place] [address]  Culture, blood (Routine X 2) w Reflex to ID Panel     Status: None (Preliminary result)   Collection Time: 11/24/22  1:03 PM   Specimen: BLOOD  Result Value Ref Range Status   Specimen Description BLOOD RIGHT HAND  Final   Special Requests   Final    BOTTLES DRAWN AEROBIC AND ANAEROBIC Blood Culture adequate volume   Culture   Final    NO GROWTH 4 DAYS Performed at [facility], [address] [place], [place] [address]    Report Status PENDING  Incomplete  Culture, blood (Routine X 2) w Reflex to ID Panel     Status: None (Preliminary result)   Collection Time: 11/24/22  1:03 PM   Specimen: BLOOD  Result Value Ref Range Status   Specimen Description BLOOD LEFT HAND  Final   Special Requests   Final    BOTTLES DRAWN AEROBIC AND ANAEROBIC Blood Culture adequate volume   Culture   Final    NO GROWTH 4 DAYS Performed at [facility], [address], [place], [place] [address]    Report Status PENDING  Incomplete   Time coordinating discharge: Over 30 minutes  Richarda Osmond, MD  [facility] 11/28/2022, 2:16 PM

## 2022-11-28 NOTE — Plan of Care (Signed)
  Problem: Respiratory: Goal: Ability to maintain adequate ventilation will improve Outcome: Progressing   Problem: Education: Goal: Knowledge of General Education information will improve Description: Including pain rating scale, medication(s)/side effects and non-pharmacologic comfort measures Outcome: Progressing   Problem: Clinical Measurements: Goal: Cardiovascular complication will be avoided Outcome: Progressing   Problem: Safety: Goal: Ability to remain free from injury will improve Outcome: Progressing

## 2022-11-28 NOTE — Progress Notes (Signed)
Occupational Therapy Treatment Patient Details Name: ADALYNE LOVICK MRN: [id number] DOB: [birth year] Today's Date: 11/28/2022   History of present illness 67 y/o female presented to ED on 11/12/22 for SOB and hypoxia. Found to be FluA+. Admitted for acute respiratory failure with hypoxia and sepsis 2/2 Flu A and Flu induced COPD exacerbation. PMH: HTN, T2DM, COPD, OSA, MS with L sided weakness, pulmonary HTN   OT comments  Chart reviewed, pt greeted in room with husband Hedy Camara present. Pt is alert, oriented to self, place, month/year, grossly to situation. CO tx completed with PT on this date. Pt continues to endorse desire to return home. Tx session targeted family/patient training, transfer training, self care training to facilitate safe discharge home. Pt continues to require MAX A +2 for be mobility, MAX A +2 for STS with MAX A required for UB/LB dressing. Unsafe to attempt transfer to transport wheelchair for discharge (with eventual need to transfer to car, then from car into house) with husband assist per a therapy perspective. Pt and husband educated re: discharge recommendations, level of assistance required at this time, use of hoyer lift for functional transfers. Pt is extremely deconditioned due to acute illness and would recommend +2 for ADL/functional transfers at this time. Discharge recommendation remains appropriate. Harrel Lemon lift is now also recommended if pt chooses to return home. OT will continue to follow acutely.    Recommendations for follow up therapy are one component of a multi-disciplinary discharge planning process, led by the attending physician.  Recommendations may be updated based on patient status, additional functional criteria and insurance authorization.    Follow Up Recommendations  Skilled nursing-short term rehab (<3 hours/day)     Assistance Recommended at Discharge Frequent or constant Supervision/Assistance  Patient can return home with the following  A lot of  help with walking and/or transfers;A lot of help with bathing/dressing/bathroom   Equipment Recommendations  Wheelchair (measurements OT);Other (comment) (hoyer lift)    Recommendations for Other Services      Precautions / Restrictions Precautions Precautions: Fall Restrictions Weight Bearing Restrictions: No       Mobility Bed Mobility Overal bed mobility: Needs Assistance Bed Mobility: Supine to Sit, Sit to Supine     Supine to sit: Max assist, +2 for physical assistance Sit to supine: Max assist, +2 for physical assistance        Transfers Overall transfer level: Needs assistance Equipment used: 2 person hand held assist Transfers: Sit to/from Stand Sit to Stand: Max assist, +2 physical assistance, +2 safety/equipment           General transfer comment: step by step muli modal cues for safety     Balance Overall balance assessment: Needs assistance Sitting-balance support: Feet supported Sitting balance-Leahy Scale: Poor Sitting balance - Comments: requires at least CGA-MIN A for static sitting balance Postural control: Right lateral lean, Left lateral lean Standing balance support: Bilateral upper extremity supported, During functional activity Standing balance-Leahy Scale: Zero                             ADL either performed or assessed with clinical judgement   ADL Overall ADL's : Needs assistance/impaired                                       General ADL Comments: MAX A for UB dressing, MAX A +2 for  LB dressing with STS, step by step vcs    Extremity/Trunk Assessment              Vision       Perception     Praxis      Cognition Arousal/Alertness: Awake/alert Behavior During Therapy: WFL for tasks assessed/performed, Restless Overall Cognitive Status: Impaired/Different from baseline Area of Impairment: Safety/judgement, Problem solving                         Safety/Judgement: Decreased  awareness of deficits Awareness: Emergent Problem Solving: Requires verbal cues, Requires tactile cues, Difficulty sequencing General Comments: pleasant and agreeable, poor awareness of current level of deficits        Exercises      Shoulder Instructions       General Comments spo2 appears >90% throughout on 3 L via [place]    Pertinent Vitals/ Pain       Pain Assessment Pain Assessment: No/denies pain  Home Living                                          Prior Functioning/Environment              Frequency  Min 2X/week        Progress Toward Goals  OT Goals(current goals can now be found in the care plan section)  Progress towards OT goals: Progressing toward goals     Plan Discharge plan remains appropriate;Frequency remains appropriate    Co-evaluation    PT/OT/SLP Co-Evaluation/Treatment: Yes Reason for Co-Treatment: Complexity of the patient's impairments (multi-system involvement);Necessary to address cognition/behavior during functional activity;For patient/therapist safety;To address functional/ADL transfers   OT goals addressed during session: ADL's and self-care      AM-PAC OT "6 Clicks" Daily Activity     Outcome Measure   Help from another person eating meals?: A Little Help from another person taking care of personal grooming?: A Little Help from another person toileting, which includes using toliet, bedpan, or urinal?: A Lot Help from another person bathing (including washing, rinsing, drying)?: A Lot Help from another person to put on and taking off regular upper body clothing?: A Lot Help from another person to put on and taking off regular lower body clothing?: A Lot 6 Click Score: 14    End of Session Equipment Utilized During Treatment: Oxygen  OT Visit Diagnosis: Unsteadiness on feet (R26.81);Muscle weakness (generalized) (M62.81)   Activity Tolerance Patient tolerated treatment well   Patient Left in bed;with  call bell/phone within reach;with chair alarm set;with family/visitor present   Nurse Communication Mobility status (team re: discharge recommendations, equipment recommendations)        Time: 1610-9604 OT Time Calculation (min): 30 min  Charges: OT General Charges $OT Visit: 1 Visit OT Treatments $Self Care/Home Management : 8-22 mins  Shanon Payor, OTD OTR/L  11/28/22, 9:54 AM

## 2022-11-28 NOTE — TOC Transition Note (Addendum)
Transition of Care [facility]) - CM/SW Discharge Note   Patient Details  Name: Ashlee Fields MRN: [id number] Date of Birth: 12-13-1955  Transition of Care [facility]) CM/SW Contact:  Laurena Slimmer, RN Phone Number: 11/28/2022, 11:56 AM   Clinical Narrative:    Spoke with patient spouse regarding discharge plan. RNCM reiterated patient required two person max assit. Patient spouse was advised of paitent's right to leave a facility for what ever reason. He stated his awareness of his rights and would like to proceed with a home discharge. He stated he had all needed DME at home to care for patient. Patient oxygen has been confirmed to have been delivered from [place] yesterday. He again refused the [facility] but will accept the hoyer lift. Patient spouse inquired about insurance coverage. Patient spouse was advised [employer] would speak with him directly and inform him of any charges that may be incurred.   EMS arranged  Face sheet and medical necessity forms  Hoyer lift requested via [employer]. Mortimer Fries from [facility] notified. Nurse notified   TOC signing off          Patient Goals and CMS Choice      Discharge Placement                         Discharge Plan and Services Additional resources added to the After Visit Summary for                                       Social Determinants of Health (SDOH) Interventions SDOH Screenings   Food Insecurity: No Food Insecurity (11/24/2022)  Housing: Low Risk  (11/24/2022)  Transportation Needs: No Transportation Needs (11/24/2022)  Utilities: Not At Risk (11/24/2022)  Tobacco Use: Medium Risk (11/24/2022)     Readmission Risk Interventions     No data to display

## 2022-11-29 LAB — CULTURE, BLOOD (ROUTINE X 2)
Culture: NO GROWTH
Culture: NO GROWTH
Special Requests: ADEQUATE
Special Requests: ADEQUATE

## 2022-12-09 ENCOUNTER — Ambulatory Visit: Payer: Medicare HMO

## 2022-12-23 ENCOUNTER — Ambulatory Visit
Admission: RE | Admit: 2022-12-23 | Discharge: 2022-12-23 | Disposition: A | Discharge status: Home / Self Care | Payer: Medicare HMO | Source: Ambulatory Visit | Attending: Urology | Admitting: Urology

## 2022-12-23 DIAGNOSIS — N3001 Acute cystitis with hematuria: Secondary | ICD-10-CM | POA: Diagnosis present

## 2022-12-23 DIAGNOSIS — N3941 Urge incontinence: Secondary | ICD-10-CM | POA: Diagnosis present

## 2022-12-23 DIAGNOSIS — R310 Gross hematuria: Secondary | ICD-10-CM | POA: Insufficient documentation

## 2022-12-23 MED ORDER — IOHEXOL 300 MG/ML  SOLN
100.0000 mL | Freq: Once | INTRAMUSCULAR | Status: AC | PRN
  Administered 2022-12-23: 100 mL via INTRAVENOUS

## 2022-12-26 ENCOUNTER — Other Ambulatory Visit: Payer: Self-pay | Admitting: Neurology

## 2022-12-26 DIAGNOSIS — G35 Multiple sclerosis: Secondary | ICD-10-CM

## 2022-12-30 ENCOUNTER — Other Ambulatory Visit: Payer: Self-pay | Admitting: Student in an Organized Health Care Education/Training Program

## 2023-01-02 ENCOUNTER — Other Ambulatory Visit: Payer: Self-pay | Admitting: Neurology

## 2023-01-02 DIAGNOSIS — G35 Multiple sclerosis: Secondary | ICD-10-CM

## 2023-01-06 ENCOUNTER — Ambulatory Visit
Admission: RE | Admit: 2023-01-06 | Discharge: 2023-01-06 | Disposition: A | Discharge status: Home / Self Care | Payer: Medicare HMO | Source: Ambulatory Visit | Attending: Internal Medicine | Admitting: Internal Medicine

## 2023-01-06 DIAGNOSIS — Z1231 Encounter for screening mammogram for malignant neoplasm of breast: Secondary | ICD-10-CM | POA: Diagnosis present

## 2023-01-14 ENCOUNTER — Ambulatory Visit
Admission: RE | Admit: 2023-01-14 | Discharge: 2023-01-14 | Disposition: A | Discharge status: Home / Self Care | Payer: Medicare HMO | Source: Ambulatory Visit | Attending: Neurology | Admitting: Neurology

## 2023-01-14 DIAGNOSIS — G35 Multiple sclerosis: Secondary | ICD-10-CM | POA: Insufficient documentation

## 2023-01-14 MED ORDER — GADOBUTROL 1 MMOL/ML IV SOLN
7.5000 mL | Freq: Once | INTRAVENOUS | Status: AC | PRN
  Administered 2023-01-14: 7.5 mL via INTRAVENOUS

## 2023-01-23 ENCOUNTER — Telehealth: Payer: Self-pay

## 2023-01-23 NOTE — Telephone Encounter (Signed)
Ok to add her on with me next week for repeat UA and PVR.

## 2023-01-23 NOTE — Telephone Encounter (Signed)
Pt called via triage line-   UTI dx by her pcp on 01/02/23. She was given Macrobid - completed Macrobid Wed pm. Thursday started having more incontinence.   She is wetting the bed. NO other sx.   What can she do?   LV with SV on 12/18.  LV with MacD on 10/13/22 for Botox treatment.   Pls advise.

## 2023-01-23 NOTE — Telephone Encounter (Signed)
.  left message to have patient return my call.  

## 2023-01-26 NOTE — Telephone Encounter (Signed)
Spoke with patient and advised results Appt scheduled

## 2023-02-05 ENCOUNTER — Ambulatory Visit: Payer: Medicare HMO | Admitting: Physician Assistant

## 2023-02-05 DIAGNOSIS — R3129 Other microscopic hematuria: Secondary | ICD-10-CM | POA: Diagnosis not present

## 2023-02-05 DIAGNOSIS — N200 Calculus of kidney: Secondary | ICD-10-CM | POA: Diagnosis not present

## 2023-02-05 DIAGNOSIS — N3941 Urge incontinence: Secondary | ICD-10-CM | POA: Diagnosis not present

## 2023-02-05 LAB — URINALYSIS, COMPLETE
Bilirubin, UA: NEGATIVE
Glucose, UA: NEGATIVE
Ketones, UA: NEGATIVE
Nitrite, UA: POSITIVE — AB
Specific Gravity, UA: 1.015 (ref 1.005–1.030)
Urobilinogen, Ur: 0.2 mg/dL (ref 0.2–1.0)
pH, UA: 5.5 (ref 5.0–7.5)

## 2023-02-05 LAB — MICROSCOPIC EXAMINATION: WBC, UA: >30 /hpf — AB (ref 0–5)

## 2023-02-05 MED ORDER — NITROFURANTOIN MONOHYD MACRO 100 MG PO CAPS: 100.0000 mg | ORAL_CAPSULE | Freq: Two times a day (BID) | ORAL | 0 refills | Status: AC

## 2023-02-05 NOTE — Progress Notes (Signed)
Virtual Visit via Telephone Note  I connected with Ashlee Fields on 02/05/23 at 11:00 AM EDT by telephone and verified that I am speaking with the correct person using two identifiers.  Location: Patient: Home in [place], [place] Provider: Clinic in [place], [place]   I discussed the limitations, risks, security and privacy concerns of performing an evaluation and management service by telephone and the availability of in person appointments. I also discussed with the patient that there may be a patient responsible charge related to this service. The patient expressed understanding and agreed to proceed.  History of Present Illness: Ashlee Fields is a 67 y.o. female with PMH MS, OSA, diabetes, OAB wet s/p intravesical Botox in November 2023, and microscopic hematuria who presents today to discuss worsening urinary leakage.  She requested a virtual visit today because she is home with a mild fever recovering from an upper respiratory infection.  Today she reports several weeks of worse urinary incontinence and nocturnal enuresis.  She is completing 7 days of doxycycline for URI today and has noticed improvement in her urinary symptoms over the past 1 to 2 days.  Her husband dropped off a urine sample for testing today.  She denies dysuria, but states she typically does not have dysuria when she is treated for UTIs.  She asks about InterStim and if this can be performed under lighter sedation.  She is hesitant to pursue procedures that would require general anesthesia due to her comorbidities.  She also inquires as to her CT urogram results.  No evidence of hydronephrosis, renal masses, or filling defects.  She has bilateral nonobstructing renal stones and some circumferential urothelial enhancement of the left renal pelvis and upper left ureter.  Notably, her left nonobstructing renal stone is situated within the renal pelvis adjacent to this area of urothelial enhancement.  She reports some  occasional left flank pain but denies gross hematuria.  She does not wish to treat this stone.  Additionally, CTU showed interval development of L1 vertebral compression and left-sided rib fractures.  She reports that these injuries were sustained about 2 years ago when she fell in the shower.  Her husband brought a urine specimen to clinic today after she collected it at home.  UA notable for 1+ blood, 2+ protein, nitrites, and 1+ leukocytes.  Urine microscopy with >30 WBCs/hpf, 11-30 RBCs/hpf, and many bacteria.  Observations/Objective: Asking appropriate questions, no apparent distress.  Assessment and Plan: 1. Urgency incontinence Improving on doxycycline, however UA provided today still appears grossly infected.  Will obtain urine culture and start empiric Macrobid.  We discussed that if her symptoms do not completely resolve, this could indicate that she is due for her next intravesical Botox.  She is unsure if she wishes to repeat Botox so soon and has concerns about insurance coverage for this.  I have asked Edwin Dada, CMA to see if she would be eligible for repeat treatment sooner than 6 months per her insurance.  We discussed that InterStim is typically placed under general anesthesia.  Will defer further conversations about this to Dr. Matilde Sprang. - Urinalysis, Complete - CULTURE, URINE COMPREHENSIVE - nitrofurantoin, macrocrystal-monohydrate, (MACROBID) 100 MG capsule; Take 1 capsule (100 mg total) by mouth 2 (two) times daily for 7 days.  Dispense: 14 capsule; Refill: 0  2. Microscopic hematuria No hydronephrosis, renal masses, or filling defects on recent CT urogram.  Suspect secondary to #3 below.  3. Nephrolithiasis Nonobstructing bilateral renal stones at the time of CT urogram,  however given location of her left renal stone within the left renal pelvis, it is possible that the stone is causing intermittent obstruction and local urothelial irritation, which would  explain the adjacent urothelial enhancement.  I recommended consideration of treating the stone, however she declined.  Will continue to monitor.  Follow Up Instructions: Return if symptoms worsen or fail to improve.    I discussed the assessment and treatment plan with the patient. The patient was provided an opportunity to ask questions and all were answered. The patient agreed with the plan and demonstrated an understanding of the instructions.   The patient was advised to call back or seek an in-person evaluation if the symptoms worsen or if the condition fails to improve as anticipated.  I provided 30 minutes of non-face-to-face time during this encounter.   Debroah Loop, PA-C

## 2023-02-10 LAB — CULTURE, URINE COMPREHENSIVE

## 2023-03-02 ENCOUNTER — Telehealth: Payer: Self-pay | Admitting: *Deleted

## 2023-03-02 NOTE — Telephone Encounter (Addendum)
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   Botox verification completed- Yes sent 03/02/2023 to [facility]  Pa needed- Yes, waiting on signature from Macdiarmid, [employer] form filled out. I have been locked out of [employer].  03/09/2023 11:58am faxed prior auth to [facility] contacted -  complete         Auth number:_[id number]________  Approval dates : 03/09/23-4/15/25_____    Botox scheduled:____6/10/2024____________  U/A & Culture ordered and scheduled:____5/31/2024________  Pt aware and instructions given.   JQ aware to order Botox. Botox labeled and in fridge 03/05/23  Date:

## 2023-03-10 ENCOUNTER — Encounter: Payer: Self-pay | Admitting: Urology

## 2023-03-12 ENCOUNTER — Ambulatory Visit: Payer: Medicare HMO | Admitting: Physician Assistant

## 2023-03-12 ENCOUNTER — Ambulatory Visit
Admission: RE | Admit: 2023-03-12 | Discharge: 2023-03-12 | Disposition: A | Discharge status: Home / Self Care | Payer: Medicare HMO | Source: Ambulatory Visit | Attending: Urology | Admitting: Urology

## 2023-03-12 ENCOUNTER — Ambulatory Visit: Payer: Medicare HMO | Admitting: General Practice

## 2023-03-12 ENCOUNTER — Encounter
Admission: RE | Disposition: A | Discharge status: Home / Self Care | Payer: Self-pay | Source: Ambulatory Visit | Attending: Urology

## 2023-03-12 ENCOUNTER — Other Ambulatory Visit: Payer: Self-pay

## 2023-03-12 ENCOUNTER — Ambulatory Visit
Admission: RE | Admit: 2023-03-12 | Discharge: 2023-03-12 | Disposition: A | Discharge status: Home / Self Care | Payer: Medicare HMO | Source: Ambulatory Visit | Attending: Physician Assistant | Admitting: Physician Assistant

## 2023-03-12 ENCOUNTER — Other Ambulatory Visit: Payer: Self-pay | Admitting: Urology

## 2023-03-12 ENCOUNTER — Encounter: Payer: Self-pay | Admitting: Urology

## 2023-03-12 ENCOUNTER — Ambulatory Visit: Payer: Medicare HMO

## 2023-03-12 VITALS — BP 146/78 | HR 76 | Temp 98.5°F | Ht 63.0 in | Wt 188.0 lb

## 2023-03-12 DIAGNOSIS — N390 Urinary tract infection, site not specified: Secondary | ICD-10-CM

## 2023-03-12 DIAGNOSIS — G4733 Obstructive sleep apnea (adult) (pediatric): Secondary | ICD-10-CM | POA: Diagnosis not present

## 2023-03-12 DIAGNOSIS — D759 Disease of blood and blood-forming organs, unspecified: Secondary | ICD-10-CM | POA: Diagnosis not present

## 2023-03-12 DIAGNOSIS — D649 Anemia, unspecified: Secondary | ICD-10-CM | POA: Diagnosis not present

## 2023-03-12 DIAGNOSIS — Z87442 Personal history of urinary calculi: Secondary | ICD-10-CM

## 2023-03-12 DIAGNOSIS — N136 Pyonephrosis: Secondary | ICD-10-CM | POA: Diagnosis present

## 2023-03-12 DIAGNOSIS — Z7952 Long term (current) use of systemic steroids: Secondary | ICD-10-CM | POA: Insufficient documentation

## 2023-03-12 DIAGNOSIS — E039 Hypothyroidism, unspecified: Secondary | ICD-10-CM | POA: Insufficient documentation

## 2023-03-12 DIAGNOSIS — Z993 Dependence on wheelchair: Secondary | ICD-10-CM | POA: Diagnosis not present

## 2023-03-12 DIAGNOSIS — N201 Calculus of ureter: Secondary | ICD-10-CM | POA: Diagnosis not present

## 2023-03-12 DIAGNOSIS — F419 Anxiety disorder, unspecified: Secondary | ICD-10-CM | POA: Insufficient documentation

## 2023-03-12 DIAGNOSIS — Z87891 Personal history of nicotine dependence: Secondary | ICD-10-CM | POA: Insufficient documentation

## 2023-03-12 DIAGNOSIS — E119 Type 2 diabetes mellitus without complications: Secondary | ICD-10-CM | POA: Insufficient documentation

## 2023-03-12 DIAGNOSIS — R109 Unspecified abdominal pain: Secondary | ICD-10-CM | POA: Insufficient documentation

## 2023-03-12 DIAGNOSIS — E669 Obesity, unspecified: Secondary | ICD-10-CM | POA: Insufficient documentation

## 2023-03-12 DIAGNOSIS — K219 Gastro-esophageal reflux disease without esophagitis: Secondary | ICD-10-CM | POA: Insufficient documentation

## 2023-03-12 DIAGNOSIS — G35 Multiple sclerosis: Secondary | ICD-10-CM | POA: Insufficient documentation

## 2023-03-12 DIAGNOSIS — Z6833 Body mass index (BMI) 33.0-33.9, adult: Secondary | ICD-10-CM | POA: Insufficient documentation

## 2023-03-12 DIAGNOSIS — Z7985 Long-term (current) use of injectable non-insulin antidiabetic drugs: Secondary | ICD-10-CM | POA: Insufficient documentation

## 2023-03-12 DIAGNOSIS — F32A Depression, unspecified: Secondary | ICD-10-CM | POA: Insufficient documentation

## 2023-03-12 DIAGNOSIS — I10 Essential (primary) hypertension: Secondary | ICD-10-CM | POA: Diagnosis not present

## 2023-03-12 HISTORY — PX: CYSTOSCOPY WITH STENT PLACEMENT: SHX5790

## 2023-03-12 LAB — URINALYSIS, COMPLETE
Bilirubin, UA: NEGATIVE
Glucose, UA: NEGATIVE
Ketones, UA: NEGATIVE
Nitrite, UA: NEGATIVE
Specific Gravity, UA: 1.015 (ref 1.005–1.030)
Urobilinogen, Ur: 0.2 mg/dL (ref 0.2–1.0)
pH, UA: 6 (ref 5.0–7.5)

## 2023-03-12 LAB — GLUCOSE, CAPILLARY
Glucose-Capillary: 151 mg/dL — ABNORMAL HIGH (ref 70–99)
Glucose-Capillary: 128 mg/dL — ABNORMAL HIGH (ref 70–99)

## 2023-03-12 LAB — MICROSCOPIC EXAMINATION
RBC, Urine: >30 /hpf — AB (ref 0–2)
WBC, UA: >30 /hpf — AB (ref 0–5)

## 2023-03-12 SURGERY — CYSTOSCOPY, WITH STENT INSERTION
Anesthesia: General | Laterality: Bilateral

## 2023-03-12 MED ORDER — MIDAZOLAM HCL 2 MG/2ML IJ SOLN
INTRAMUSCULAR | Status: DC | PRN
  Administered 2023-03-12: 1 mg via INTRAVENOUS

## 2023-03-12 MED ORDER — CIPROFLOXACIN IN D5W 400 MG/200ML IV SOLN
INTRAVENOUS | Status: AC
  Filled 2023-03-12: qty 200

## 2023-03-12 MED ORDER — SODIUM CHLORIDE 0.9 % IV SOLN: INTRAVENOUS | Status: DC

## 2023-03-12 MED ORDER — CIPROFLOXACIN HCL 500 MG PO TABS
500.0000 mg | ORAL_TABLET | Freq: Once | ORAL | Status: DC
Start: 2023-03-12 — End: 2023-03-12
  Administered 2023-03-12: 500 mg via ORAL

## 2023-03-12 MED ORDER — CIPROFLOXACIN IN D5W 400 MG/200ML IV SOLN: 400.0000 mg | INTRAVENOUS | Status: DC

## 2023-03-12 MED ORDER — PROPOFOL 10 MG/ML IV BOLUS
INTRAVENOUS | Status: DC | PRN
  Administered 2023-03-12: 100 mg via INTRAVENOUS

## 2023-03-12 MED ORDER — LIDOCAINE HCL (CARDIAC) PF 100 MG/5ML IV SOSY
PREFILLED_SYRINGE | INTRAVENOUS | Status: DC | PRN
  Administered 2023-03-12: 100 mg via INTRAVENOUS

## 2023-03-12 MED ORDER — FENTANYL CITRATE (PF) 100 MCG/2ML IJ SOLN
INTRAMUSCULAR | Status: DC | PRN
  Administered 2023-03-12: 50 ug via INTRAVENOUS

## 2023-03-12 MED ORDER — CHLORHEXIDINE GLUCONATE CLOTH 2 % EX PADS
6.0000 | MEDICATED_PAD | Freq: Once | CUTANEOUS | Status: AC
  Administered 2023-03-12: 6 via TOPICAL

## 2023-03-12 MED ORDER — CIPROFLOXACIN HCL 500 MG PO TABS: 500.0000 mg | ORAL_TABLET | Freq: Two times a day (BID) | ORAL | 0 refills | Status: AC

## 2023-03-12 MED ORDER — PROPOFOL 10 MG/ML IV BOLUS
INTRAVENOUS | Status: AC
  Filled 2023-03-12: qty 20

## 2023-03-12 MED ORDER — CHLORHEXIDINE GLUCONATE 0.12 % MT SOLN
15.0000 mL | Freq: Once | OROMUCOSAL | Status: AC
  Administered 2023-03-12: 15 mL via OROMUCOSAL

## 2023-03-12 MED ORDER — ONDANSETRON HCL 4 MG/2ML IJ SOLN
INTRAMUSCULAR | Status: DC | PRN
  Administered 2023-03-12: 4 mg via INTRAVENOUS

## 2023-03-12 MED ORDER — CHLORHEXIDINE GLUCONATE CLOTH 2 % EX PADS: 6.0000 | MEDICATED_PAD | Freq: Once | CUTANEOUS | Status: DC

## 2023-03-12 MED ORDER — HYDROCORTISONE SOD SUC (PF) 1000 MG IJ SOLR
INTRAMUSCULAR | Status: DC | PRN
  Administered 2023-03-12: 100 mg via INTRAVENOUS

## 2023-03-12 MED ORDER — MIDAZOLAM HCL 2 MG/2ML IJ SOLN
INTRAMUSCULAR | Status: AC
  Filled 2023-03-12: qty 2

## 2023-03-12 MED ORDER — CHLORHEXIDINE GLUCONATE 0.12 % MT SOLN
OROMUCOSAL | Status: AC
  Filled 2023-03-12: qty 15

## 2023-03-12 MED ORDER — SUGAMMADEX SODIUM 200 MG/2ML IV SOLN
INTRAVENOUS | Status: DC | PRN
  Administered 2023-03-12: 300 mg via INTRAVENOUS

## 2023-03-12 MED ORDER — ROCURONIUM BROMIDE 100 MG/10ML IV SOLN
INTRAVENOUS | Status: DC | PRN
  Administered 2023-03-12: 50 mg via INTRAVENOUS

## 2023-03-12 MED ORDER — FENTANYL CITRATE (PF) 100 MCG/2ML IJ SOLN
INTRAMUSCULAR | Status: AC
  Filled 2023-03-12: qty 2

## 2023-03-12 MED ORDER — SODIUM CHLORIDE 0.9 % IR SOLN
Status: DC | PRN
  Administered 2023-03-12: 3000 mL via INTRAVESICAL

## 2023-03-12 SURGICAL SUPPLY — 24 items
BAG DRAIN SIEMENS DORNER NS (MISCELLANEOUS) ×1 IMPLANT
BAG DRN NS LF (MISCELLANEOUS) ×1
BRUSH SCRUB EZ 1% IODOPHOR (MISCELLANEOUS) IMPLANT
CATH URETL OPEN 5X70 (CATHETERS) ×1 IMPLANT
GAUZE 4X4 16PLY ~~LOC~~+RFID DBL (SPONGE) ×2 IMPLANT
GLOVE BIOGEL PI IND STRL 7.5 (GLOVE) ×1 IMPLANT
GOWN STRL REUS W/ TWL LRG LVL3 (GOWN DISPOSABLE) ×1 IMPLANT
GOWN STRL REUS W/ TWL XL LVL3 (GOWN DISPOSABLE) ×1 IMPLANT
GOWN STRL REUS W/TWL LRG LVL3 (GOWN DISPOSABLE) ×1
GOWN STRL REUS W/TWL XL LVL3 (GOWN DISPOSABLE) ×1
GUIDEWIRE STR DUAL SENSOR (WIRE) ×1 IMPLANT
IV NS IRRIG 3000ML ARTHROMATIC (IV SOLUTION) ×1 IMPLANT
KIT TURNOVER CYSTO (KITS) ×1 IMPLANT
MANIFOLD NEPTUNE II (INSTRUMENTS) ×1 IMPLANT
PACK CYSTO AR (MISCELLANEOUS) ×1 IMPLANT
SET CYSTO W/LG BORE CLAMP LF (SET/KITS/TRAYS/PACK) ×1 IMPLANT
STENT URET 6FRX24 CONTOUR (STENTS) IMPLANT
STENT URET 6FRX26 CONTOUR (STENTS) IMPLANT
SURGILUBE 2OZ TUBE FLIPTOP (MISCELLANEOUS) ×1 IMPLANT
SYR TOOMEY IRRIG 70ML (MISCELLANEOUS)
SYRINGE TOOMEY IRRIG 70ML (MISCELLANEOUS) IMPLANT
TRAP FLUID SMOKE EVACUATOR (MISCELLANEOUS) ×1 IMPLANT
WATER STERILE IRR 1000ML POUR (IV SOLUTION) ×1 IMPLANT
WATER STERILE IRR 500ML POUR (IV SOLUTION) ×1 IMPLANT

## 2023-03-12 NOTE — Progress Notes (Signed)
03/12/2023 10:43 AM   Ashlee Fields 02-Jul-1956 [id number]  CC: Chief Complaint  Patient presents with   Follow-up   HPI: Ashlee Fields is a 67 y.o. female with PMH MS, OSA, diabetes, OAB wet s/p intravesical Botox in November 2023, bilateral nephrolithiasis, and microscopic hematuria who presents today for evaluation of possible UTI.   Today she reports an approximate 1 week history of intermittent dark urine, fatigue, and malaise.  She has chronic joint and back pain and denies acute changes.  She denies dysuria, acute worsening in urgency/frequency, or fevers.  She ate a piece of toast this morning at 9:30 AM.  She took Tylenol and ibuprofen around 8 AM.  In-office UA today positive for 3+ blood, trace protein, and 2+ leukocytes; urine microscopy with >30 WBCs/HPF, >30 RBCs/HPF, and moderate bacteria.   She was sent from clinic for a stat CT stone study due to concerns for a possible acute stone episode with complicated UTI.  It revealed an obstructing 7.5 mm proximal left ureteral stone with mild upstream hydronephrosis and a 5.5 mm right UPJ stone without hydronephrosis.  PMH: Past Medical History:  Diagnosis Date   Anxiety    Arthritis    Depression    Diabetes mellitus without complication (HCC)    GERD (gastroesophageal reflux disease)    Hypertension    Hypothyroidism    Multiple sclerosis (HCC) 1980   Neutropenia (HCC) 11/22/2022   Osteoporosis    Sleep apnea    Vision abnormalities     Surgical History: Past Surgical History:  Procedure Laterality Date   CESAREAN SECTION     COLONOSCOPY     ESOPHAGOGASTRODUODENOSCOPY (EGD) WITH PROPOFOL N/A 11/20/2020   Procedure: ESOPHAGOGASTRODUODENOSCOPY (EGD) WITH PROPOFOL;  Surgeon: Regis Bill, MD;  Location: [facility];  Service: Endoscopy;  Laterality: N/A;   JOINT REPLACEMENT     REPLACEMENT TOTAL KNEE Left 2009   RIGHT HEART CATH Right 08/05/2017   Procedure: RIGHT HEART CATH;  Surgeon: Dalia Heading, MD;  Location: [facility];  Service: Cardiovascular;  Laterality: Right;   TOTAL KNEE ARTHROPLASTY Right 02/28/2020   Procedure: TOTAL KNEE ARTHROPLASTY;  Surgeon: Juanell Fairly, MD;  Location: [facility];  Service: Orthopedics;  Laterality: Right;    Home Medications:  Allergies as of 03/12/2023       Reactions   Penicillins Hives   Did it involve swelling of the face/tongue/throat, SOB, or low BP? No Did it involve sudden or severe rash/hives, skin peeling, or any reaction on the inside of your mouth or nose? Yes Did you need to seek medical attention at a hospital or doctor's office? Unknown When did it last happen?      20 + years If all above answers are "NO", may proceed with cephalosporin use.   Other Hives   succinylsulfathiazole   Ibuprofen Rash   Sulfa Antibiotics Hives, Nausea And Vomiting, Itching, Rash        Medication List        Accurate as of March 12, 2023 10:43 AM. If you have any questions, ask your nurse or doctor.          acetaminophen 500 MG tablet Commonly known as: TYLENOL Take 2 tablets (1,000 mg total) by mouth 3 (three) times daily.   amLODipine 5 MG tablet Commonly known as: NORVASC Take by mouth.   ARIPiprazole 2 MG tablet Commonly known as: ABILIFY Take by mouth.   ascorbic acid 500 MG tablet Commonly known as:  VITAMIN C Take 500 mg by mouth daily.   atorvastatin 80 MG tablet Commonly known as: LIPITOR Take 80 mg by mouth at bedtime.   bisoprolol 10 MG tablet Commonly known as: ZEBETA Take 10 mg by mouth daily.   calcium-vitamin D 500-200 MG-UNIT tablet Commonly known as: OSCAL WITH D Take 1 tablet by mouth 2 (two) times daily. With meals   cyanocobalamin 1000 MCG/ML injection Commonly known as: VITAMIN B12 Inject 1,000 mcg into the muscle every 30 (thirty) days.   diazepam 2 MG tablet Commonly known as: VALIUM Take 2 mg by mouth at bedtime as needed for muscle spasms.   FLUoxetine 20 MG  capsule Commonly known as: PROZAC Take 60 mg by mouth daily.   gabapentin 300 MG capsule Commonly known as: NEURONTIN Take by mouth.   hydrochlorothiazide 25 MG tablet Commonly known as: HYDRODIURIL Take 25 mg by mouth in the morning.   ipratropium-albuterol 0.5-2.5 (3) MG/3ML Soln Commonly known as: DUONEB INHALE 3 ML BY NEBULIZER EVERY 6 HOURS AS NEEDED   levothyroxine 50 MCG tablet Commonly known as: SYNTHROID Take 50 mcg by mouth daily before breakfast.   LORazepam 1 MG tablet Commonly known as: ATIVAN Take 1 mg by mouth 2 (two) times daily.   metFORMIN 500 MG 24 hr tablet Commonly known as: GLUCOPHAGE-XR Take 1,500 mg by mouth every evening.   methylphenidate 10 MG tablet Commonly known as: RITALIN Take 40 mg by mouth daily.   multivitamin with minerals tablet Take 1 tablet by mouth daily.   ondansetron 4 MG disintegrating tablet Commonly known as: ZOFRAN-ODT Take 4 mg by mouth every 8 (eight) hours as needed for nausea or vomiting.   Ozempic (0.25 or 0.5 MG/DOSE) 2 MG/3ML Sopn Generic drug: Semaglutide(0.25 or 0.5MG /DOS) Inject 0.5 mg into the skin every Wednesday.   pantoprazole 40 MG tablet Commonly known as: PROTONIX Take 40 mg by mouth 2 (two) times daily.   Phendimetrazine Tartrate 35 MG Tabs Take 35 mg by mouth 3 (three) times daily.   predniSONE 10 MG tablet Commonly known as: DELTASONE Take 10 mg by mouth daily.   traMADol 50 MG tablet Commonly known as: ULTRAM Take 50-100 mg by mouth every 8 (eight) hours as needed for moderate pain.   valsartan-hydrochlorothiazide 320-12.5 MG tablet Commonly known as: DIOVAN-HCT Take 1 tablet by mouth at bedtime.        Allergies:  Allergies  Allergen Reactions   Penicillins Hives    Did it involve swelling of the face/tongue/throat, SOB, or low BP? No Did it involve sudden or severe rash/hives, skin peeling, or any reaction on the inside of your mouth or nose? Yes Did you need to seek medical  attention at a hospital or doctor's office? Unknown When did it last happen?      20 + years If all above answers are "NO", may proceed with cephalosporin use.     Other Hives    succinylsulfathiazole   Ibuprofen Rash   Sulfa Antibiotics Hives, Nausea And Vomiting, Itching and Rash    Family History: Family History  Problem Relation Age of Onset   Breast cancer Paternal Grandmother    Heart disease Mother    Kidney failure Father    Colon cancer Maternal Grandmother     Social History:   reports that she quit smoking about 6 years ago. Her smoking use included cigarettes. She has been exposed to tobacco smoke. She has never used smokeless tobacco. She reports that she does not drink alcohol  and does not use drugs.  Physical Exam: BP (!) 146/78   Pulse 76   Temp 98.5 F (36.9 C)   Ht 5\' 3"  (1.6 m)   Wt 188 lb (85.3 kg)   BMI 33.30 kg/m   Constitutional:  Alert and oriented, no acute distress, fatigued appearing but nontoxic HEENT: , AT Cardiovascular: No clubbing, cyanosis, or edema Respiratory: Normal respiratory effort, no increased work of breathing Skin: No rashes, bruises or suspicious lesions Neurologic: Grossly intact, no focal deficits, moving all 4 extremities Psychiatric: Normal mood and affect  Laboratory Data: See Epic  Pertinent Imaging: STAT CT Stone study, 03/12/2023: CLINICAL DATA:  Abdominal pain. Stone suspected. Flank pain. Personal history of urolithiasis.   EXAM: CT ABDOMEN AND PELVIS WITHOUT CONTRAST   TECHNIQUE: Multidetector CT imaging of the abdomen and pelvis was performed following the standard protocol without IV contrast.   RADIATION DOSE REDUCTION: This exam was performed according to the departmental dose-optimization program which includes automated exposure control, adjustment of the mA and/or kV according to patient size and/or use of iterative reconstruction technique.   COMPARISON:  CT abdomen pelvis without and with  contrast 12/23/2022   FINDINGS: Lower chest: Minimal atelectasis is present the lung bases. Heart size is normal. No significant pleural or pericardial effusion is present.   Hepatobiliary: No focal liver abnormality is seen. No gallstones, gallbladder wall thickening, or biliary dilatation.   Pancreas: Unremarkable. No pancreatic ductal dilatation or surrounding inflammatory changes.   Spleen: Normal in size without focal abnormality.   Adrenals/Urinary Tract: Adrenal glands are normal bilaterally. A 7.5 mm stone is again noted within the proximal left ureter the 7.5 mm stone previously seen in the left renal collecting system is now in the proximal ureter mild hydronephrosis present on the left. No additional left-sided stones are present. A punctate nonobstructing stone is present in the midportion of the right kidney. A 5.5 mm stone is present at the right UPJ without hydronephrosis. No solid lesions are present in either kidney. More distal ureters are normal bilaterally. The urinary bladder is normal.   Stomach/Bowel: The stomach and duodenum are within normal limits. Small bowel is unremarkable. Terminal ileum is normal. The appendix is visualized and normal. The ascending and transverse colon are normal. The descending and sigmoid colon are within normal limits.   Vascular/Lymphatic: Atherosclerotic calcifications are present the aorta branch vessels. No aneurysms are present.   Reproductive: Uterus and bilateral adnexa are unremarkable.   Other: No abdominal wall hernia or abnormality. No abdominopelvic ascites.   Musculoskeletal: Multilevel degenerative changes are stable. L1 vertebral plana compression fracture is stable.   IMPRESSION: 1. Obstructing 7.5 mm stone in the proximal left ureter with mild left-sided hydronephrosis. 2. Punctate nonobstructing stone in the midportion of the right kidney. 3. 5.5 mm stone at the right UPJ without hydronephrosis. 4.  Stable L1 vertebral plana compression fracture. 5.  Aortic Atherosclerosis (ICD10-I70.0).     Electronically Signed   By: Marin Roberts M.D.   On: 03/12/2023 12:44  I personally reviewed the images referenced above and note bilateral proximal ureteral stones.  Assessment & Plan:   1. Flank pain with history of urolithiasis 1 week of malaise and fatigue in the setting of grossly infected appearing urine.  With her history of bilateral renal stones, I obtained stat CT stone study today, which revealed an obstructing proximal left ureteral stone and a right UPJ stone.  Case discussed with Dr. Richardo Hanks, who is on-call today.  He recommended urgent  bilateral ureteral stent placement today with plans for bilateral ureteroscopy with laser lithotripsy and stent exchange in 2 to 3 weeks after completion of 10 to 14 days of culture appropriate antibiotics.  Fortunately, patient is afebrile, VSS in clinic today, though she is on antipyretics.  Will plan for outpatient procedure but keep a low threshold for admission in the setting of signs or symptoms of sepsis.  I administered 1 dose of Cipro 500 mg in clinic today before sending her upstairs to same-day surgery for check-in.  I had a lengthy conversation with the patient and her husband today regarding the plan.  She expressed understanding, all questions answered.  She understands that she will require follow-up definitive stone management.  She understands that the purpose of today is to relieve the obstruction caused by her stones so we can appropriately treat her infections.  She understands that I do not anticipate today's procedure will impact her underlying OAB or occasional UTIs. - Urinalysis, Complete - CULTURE, URINE COMPREHENSIVE - CT RENAL STONE STUDY; Future - ciprofloxacin (CIPRO) tablet 500 mg   Return for Proceed to same-day surgery for bilateral ureteral stent placement.  Carman Ching, PA-C  [facility]   [address], [address] [place], [place] [address] [phone]

## 2023-03-12 NOTE — H&P (View-Only) (Signed)
 03/12/2023 10:43 AM   Ashlee Fields 02/18/1956 [id number]  CC: Chief Complaint  Patient presents with   Follow-up   HPI: Ashlee Fields is a 66 y.o. female with PMH MS, OSA, diabetes, OAB wet s/p intravesical Botox in November 2023, bilateral nephrolithiasis, and microscopic hematuria who presents today for evaluation of possible UTI.   Today she reports an approximate 1 week history of intermittent dark urine, fatigue, and malaise.  She has chronic joint and back pain and denies acute changes.  She denies dysuria, acute worsening in urgency/frequency, or fevers.  She ate a piece of toast this morning at 9:30 AM.  She took Tylenol and ibuprofen around 8 AM.  In-office UA today positive for 3+ blood, trace protein, and 2+ leukocytes; urine microscopy with >30 WBCs/HPF, >30 RBCs/HPF, and moderate bacteria.   She was sent from clinic for a stat CT stone study due to concerns for a possible acute stone episode with complicated UTI.  It revealed an obstructing 7.5 mm proximal left ureteral stone with mild upstream hydronephrosis and a 5.5 mm right UPJ stone without hydronephrosis.  PMH: Past Medical History:  Diagnosis Date   Anxiety    Arthritis    Depression    Diabetes mellitus without complication (HCC)    GERD (gastroesophageal reflux disease)    Hypertension    Hypothyroidism    Multiple sclerosis (HCC) 1980   Neutropenia (HCC) 11/22/2022   Osteoporosis    Sleep apnea    Vision abnormalities     Surgical History: Past Surgical History:  Procedure Laterality Date   CESAREAN SECTION     COLONOSCOPY     ESOPHAGOGASTRODUODENOSCOPY (EGD) WITH PROPOFOL N/A 11/20/2020   Procedure: ESOPHAGOGASTRODUODENOSCOPY (EGD) WITH PROPOFOL;  Surgeon: Locklear, Cameron T, MD;  Location: [facility];  Service: Endoscopy;  Laterality: N/A;   JOINT REPLACEMENT     REPLACEMENT TOTAL KNEE Left 2009   RIGHT HEART CATH Right 08/05/2017   Procedure: RIGHT HEART CATH;  Surgeon: Fath,  Kenneth A, MD;  Location: [facility];  Service: Cardiovascular;  Laterality: Right;   TOTAL KNEE ARTHROPLASTY Right 02/28/2020   Procedure: TOTAL KNEE ARTHROPLASTY;  Surgeon: Krasinski, Kevin, MD;  Location: [facility];  Service: Orthopedics;  Laterality: Right;    Home Medications:  Allergies as of 03/12/2023       Reactions   Penicillins Hives   Did it involve swelling of the face/tongue/throat, SOB, or low BP? No Did it involve sudden or severe rash/hives, skin peeling, or any reaction on the inside of your mouth or nose? Yes Did you need to seek medical attention at a hospital or doctor's office? Unknown When did it last happen?      20 + years If all above answers are "NO", may proceed with cephalosporin use.   Other Hives   succinylsulfathiazole   Ibuprofen Rash   Sulfa Antibiotics Hives, Nausea And Vomiting, Itching, Rash        Medication List        Accurate as of March 12, 2023 10:43 AM. If you have any questions, ask your nurse or doctor.          acetaminophen 500 MG tablet Commonly known as: TYLENOL Take 2 tablets (1,000 mg total) by mouth 3 (three) times daily.   amLODipine 5 MG tablet Commonly known as: NORVASC Take by mouth.   ARIPiprazole 2 MG tablet Commonly known as: ABILIFY Take by mouth.   ascorbic acid 500 MG tablet Commonly known as:   VITAMIN C Take 500 mg by mouth daily.   atorvastatin 80 MG tablet Commonly known as: LIPITOR Take 80 mg by mouth at bedtime.   bisoprolol 10 MG tablet Commonly known as: ZEBETA Take 10 mg by mouth daily.   calcium-vitamin D 500-200 MG-UNIT tablet Commonly known as: OSCAL WITH D Take 1 tablet by mouth 2 (two) times daily. With meals   cyanocobalamin 1000 MCG/ML injection Commonly known as: VITAMIN B12 Inject 1,000 mcg into the muscle every 30 (thirty) days.   diazepam 2 MG tablet Commonly known as: VALIUM Take 2 mg by mouth at bedtime as needed for muscle spasms.   FLUoxetine 20 MG  capsule Commonly known as: PROZAC Take 60 mg by mouth daily.   gabapentin 300 MG capsule Commonly known as: NEURONTIN Take by mouth.   hydrochlorothiazide 25 MG tablet Commonly known as: HYDRODIURIL Take 25 mg by mouth in the morning.   ipratropium-albuterol 0.5-2.5 (3) MG/3ML Soln Commonly known as: DUONEB INHALE 3 ML BY NEBULIZER EVERY 6 HOURS AS NEEDED   levothyroxine 50 MCG tablet Commonly known as: SYNTHROID Take 50 mcg by mouth daily before breakfast.   LORazepam 1 MG tablet Commonly known as: ATIVAN Take 1 mg by mouth 2 (two) times daily.   metFORMIN 500 MG 24 hr tablet Commonly known as: GLUCOPHAGE-XR Take 1,500 mg by mouth every evening.   methylphenidate 10 MG tablet Commonly known as: RITALIN Take 40 mg by mouth daily.   multivitamin with minerals tablet Take 1 tablet by mouth daily.   ondansetron 4 MG disintegrating tablet Commonly known as: ZOFRAN-ODT Take 4 mg by mouth every 8 (eight) hours as needed for nausea or vomiting.   Ozempic (0.25 or 0.5 MG/DOSE) 2 MG/3ML Sopn Generic drug: Semaglutide(0.25 or 0.5MG/DOS) Inject 0.5 mg into the skin every Wednesday.   pantoprazole 40 MG tablet Commonly known as: PROTONIX Take 40 mg by mouth 2 (two) times daily.   Phendimetrazine Tartrate 35 MG Tabs Take 35 mg by mouth 3 (three) times daily.   predniSONE 10 MG tablet Commonly known as: DELTASONE Take 10 mg by mouth daily.   traMADol 50 MG tablet Commonly known as: ULTRAM Take 50-100 mg by mouth every 8 (eight) hours as needed for moderate pain.   valsartan-hydrochlorothiazide 320-12.5 MG tablet Commonly known as: DIOVAN-HCT Take 1 tablet by mouth at bedtime.        Allergies:  Allergies  Allergen Reactions   Penicillins Hives    Did it involve swelling of the face/tongue/throat, SOB, or low BP? No Did it involve sudden or severe rash/hives, skin peeling, or any reaction on the inside of your mouth or nose? Yes Did you need to seek medical  attention at a hospital or doctor's office? Unknown When did it last happen?      20 + years If all above answers are "NO", may proceed with cephalosporin use.     Other Hives    succinylsulfathiazole   Ibuprofen Rash   Sulfa Antibiotics Hives, Nausea And Vomiting, Itching and Rash    Family History: Family History  Problem Relation Age of Onset   Breast cancer Paternal Grandmother    Heart disease Mother    Kidney failure Father    Colon cancer Maternal Grandmother     Social History:   reports that she quit smoking about 6 years ago. Her smoking use included cigarettes. She has been exposed to tobacco smoke. She has never used smokeless tobacco. She reports that she does not drink alcohol   and does not use drugs.  Physical Exam: BP (!) 146/78   Pulse 76   Temp 98.5 F (36.9 C)   Ht 5' 3" (1.6 m)   Wt 188 lb (85.3 kg)   BMI 33.30 kg/m   Constitutional:  Alert and oriented, no acute distress, fatigued appearing but nontoxic HEENT: [place], AT Cardiovascular: No clubbing, cyanosis, or edema Respiratory: Normal respiratory effort, no increased work of breathing Skin: No rashes, bruises or suspicious lesions Neurologic: Grossly intact, no focal deficits, moving all 4 extremities Psychiatric: Normal mood and affect  Laboratory Data: See Epic  Pertinent Imaging: STAT CT Stone study, 03/12/2023: CLINICAL DATA:  Abdominal pain. Stone suspected. Flank pain. Personal history of urolithiasis.   EXAM: CT ABDOMEN AND PELVIS WITHOUT CONTRAST   TECHNIQUE: Multidetector CT imaging of the abdomen and pelvis was performed following the standard protocol without IV contrast.   RADIATION DOSE REDUCTION: This exam was performed according to the departmental dose-optimization program which includes automated exposure control, adjustment of the mA and/or kV according to patient size and/or use of iterative reconstruction technique.   COMPARISON:  CT abdomen pelvis without and with  contrast 12/23/2022   FINDINGS: Lower chest: Minimal atelectasis is present the lung bases. Heart size is normal. No significant pleural or pericardial effusion is present.   Hepatobiliary: No focal liver abnormality is seen. No gallstones, gallbladder wall thickening, or biliary dilatation.   Pancreas: Unremarkable. No pancreatic ductal dilatation or surrounding inflammatory changes.   Spleen: Normal in size without focal abnormality.   Adrenals/Urinary Tract: Adrenal glands are normal bilaterally. A 7.5 mm stone is again noted within the proximal left ureter the 7.5 mm stone previously seen in the left renal collecting system is now in the proximal ureter mild hydronephrosis present on the left. No additional left-sided stones are present. A punctate nonobstructing stone is present in the midportion of the right kidney. A 5.5 mm stone is present at the right UPJ without hydronephrosis. No solid lesions are present in either kidney. More distal ureters are normal bilaterally. The urinary bladder is normal.   Stomach/Bowel: The stomach and duodenum are within normal limits. Small bowel is unremarkable. Terminal ileum is normal. The appendix is visualized and normal. The ascending and transverse colon are normal. The descending and sigmoid colon are within normal limits.   Vascular/Lymphatic: Atherosclerotic calcifications are present the aorta branch vessels. No aneurysms are present.   Reproductive: Uterus and bilateral adnexa are unremarkable.   Other: No abdominal wall hernia or abnormality. No abdominopelvic ascites.   Musculoskeletal: Multilevel degenerative changes are stable. L1 vertebral plana compression fracture is stable.   IMPRESSION: 1. Obstructing 7.5 mm stone in the proximal left ureter with mild left-sided hydronephrosis. 2. Punctate nonobstructing stone in the midportion of the right kidney. 3. 5.5 mm stone at the right UPJ without hydronephrosis. 4.  Stable L1 vertebral plana compression fracture. 5.  Aortic Atherosclerosis (ICD10-I70.0).     Electronically Signed   By: Christopher  Mattern M.D.   On: 03/12/2023 12:44  I personally reviewed the images referenced above and note bilateral proximal ureteral stones.  Assessment & Plan:   1. Flank pain with history of urolithiasis 1 week of malaise and fatigue in the setting of grossly infected appearing urine.  With her history of bilateral renal stones, I obtained stat CT stone study today, which revealed an obstructing proximal left ureteral stone and a right UPJ stone.  Case discussed with Dr. Sninsky, who is on-call today.  He recommended urgent   bilateral ureteral stent placement today with plans for bilateral ureteroscopy with laser lithotripsy and stent exchange in 2 to 3 weeks after completion of 10 to 14 days of culture appropriate antibiotics.  Fortunately, patient is afebrile, VSS in clinic today, though she is on antipyretics.  Will plan for outpatient procedure but keep a low threshold for admission in the setting of signs or symptoms of sepsis.  I administered 1 dose of Cipro 500 mg in clinic today before sending her upstairs to same-day surgery for check-in.  I had a lengthy conversation with the patient and her husband today regarding the plan.  She expressed understanding, all questions answered.  She understands that she will require follow-up definitive stone management.  She understands that the purpose of today is to relieve the obstruction caused by her stones so we can appropriately treat her infections.  She understands that I do not anticipate today's procedure will impact her underlying OAB or occasional UTIs. - Urinalysis, Complete - CULTURE, URINE COMPREHENSIVE - CT RENAL STONE STUDY; Future - ciprofloxacin (CIPRO) tablet 500 mg   Return for Proceed to same-day surgery for bilateral ureteral stent placement.  Willoughby Doell, PA-C  [facility]  [place] [address], [address] [place], [place] [address] [phone]    

## 2023-03-12 NOTE — Anesthesia Preprocedure Evaluation (Addendum)
Anesthesia Evaluation  Patient identified by MRN, date of birth, ID band Patient awake    Reviewed: Allergy & Precautions, NPO status , Patient's Chart, lab work & pertinent test results  History of Anesthesia Complications Negative for: history of anesthetic complications  Airway Mallampati: I   Neck ROM: Full    Dental  (+) Missing   Pulmonary sleep apnea and Continuous Positive Airway Pressure Ventilation , former smoker (quit 2018)   Pulmonary exam normal breath sounds clear to auscultation       Cardiovascular hypertension, Normal cardiovascular exam Rhythm:Regular Rate:Normal  ECG 11/22/22: normal   Neuro/Psych  PSYCHIATRIC DISORDERS Anxiety Depression     Neuromuscular disease (multiple sclerosis, wheelchair dependent)    GI/Hepatic ,GERD  ,,  Endo/Other  diabetes, Type 2Hypothyroidism  Obesity   Renal/GU      Musculoskeletal   Abdominal   Peds  Hematology  (+) Blood dyscrasia, anemia   Anesthesia Other Findings Last dose of Ozempic 4/17/2.  Patient takes prednisone 10mg  daily.  Reproductive/Obstetrics                             Anesthesia Physical Anesthesia Plan  ASA: 3 and emergent  Anesthesia Plan: General   Post-op Pain Management:    Induction: Intravenous and Rapid sequence  PONV Risk Score and Plan: 3 and Ondansetron, Dexamethasone and Treatment may vary due to age or medical condition  Airway Management Planned: Oral ETT  Additional Equipment:   Intra-op Plan:   Post-operative Plan: Extubation in OR  Informed Consent: I have reviewed the patients History and Physical, chart, labs and discussed the procedure including the risks, benefits and alternatives for the proposed anesthesia with the patient or authorized representative who has indicated his/her understanding and acceptance.     Dental advisory given  Plan Discussed with: CRNA  Anesthesia Plan  Comments: (RSI for Ozempic and last food intake at 9:30 am on 03/12/23.  Stress dose steroids intra-op.    Patient consented for risks of anesthesia including but not limited to:  - adverse reactions to medications - damage to eyes, teeth, lips or other oral mucosa - nerve damage due to positioning  - sore throat or hoarseness - damage to heart, brain, nerves, lungs, other parts of body or loss of life  Informed patient about role of CRNA in peri- and intra-operative care.  Patient voiced understanding.)        Anesthesia Quick Evaluation

## 2023-03-12 NOTE — Interval H&P Note (Signed)
UROLOGY H&P UPDATE  Agree with prior H&P dated 03/12/2023 by Ashlee Sinclair, PA.  67 year old female with fatigue and left-sided flank pain, urinalysis appears grossly infected, CT shows 8 mm left proximal ureteral stone with upstream hydronephrosis, right 6 mm UPJ stone without hydronephrosis.  She opted for cystoscopy with bilateral ureteral stent placement with plan for delayed bilateral ureteroscopy after infection treated.  Cardiac: RRR Lungs: CTA bilaterally  Laterality: Bilateral Procedure: Cystoscopy, bilateral ureteral stent placement  We discussed the need for drainage in the setting of an infected and obstructed system.  A ureteral stent is a small plastic tube that is placed cystoscopically with one end in the kidney and the other end in the bladder that allows the infection from the kidney to drain, and relieves pain from the obstructing stone.  We discussed the risks at length including bleeding, infection, sepsis, death, ureteral injury, and stent related symptoms including urgency/frequency/dysuria/flank pain/gross hematuria.  There is a low, but not 0, risk of inability to pass the ureteral stent alongside the stone from below which would require percutaneous nephrostomy tube by interventional radiology.  Finally, we discussed possible prolonged hospitalization and recovery, possible temporary Foley catheter placement, and 10 to 14-day course of antibiotics.  We reviewed the need for a follow-up procedure for definitive management of their stone when the infection has been treated in 2 to 3 weeks with ureteroscopy/laser lithotripsy.  She overall feels well and would like to discharge today which I think is reasonable if she remains afebrile and normotensive.  She understands possible need for hospitalization pending postop course.  Legrand Rams, MD 03/12/2023    Ashlee Come, MD 03/12/2023

## 2023-03-12 NOTE — Progress Notes (Signed)
Surgical Physician Order Form [facility]  * Scheduling expectation :  Today, 4/18  *Length of Case: 30 minutes  *Clearance needed: no  *Anticoagulation Instructions: May continue all anticoagulants  *Aspirin Instructions: Ok to continue all  *Post-op visit Date/Instructions:   Will schedule bilateral ureteroscopy in 2 to 4 weeks  *Diagnosis: Bilateral ureteral stones, UTI  *Procedure: bilateral Cysto w/stent placement ([id number])   Additional orders: N/A  -Admit type: OUTpatient  -Anesthesia: General  -VTE Prophylaxis Standing Order SCD's       Other:   -Standing Lab Orders Per Anesthesia    Lab other: None  -Standing Test orders EKG/Chest x-ray per Anesthesia       Test other:   - Medications:  Cipro 400mg  IV  -Other orders:  N/A

## 2023-03-12 NOTE — Progress Notes (Signed)
Surgical Physician Order Form [facility] [place]  * Scheduling expectation :  2 to 4 weeks  *Length of Case: 1 hour  *Clearance needed: no  *Anticoagulation Instructions: May continue all anticoagulants  *Aspirin Instructions: Ok to continue Aspirin  *Post-op visit Date/Instructions:   tbd  *Diagnosis: Right UPJ Stone, left ureteral stone  *Procedure: bilateral  Ureteroscopy w/laser lithotripsy & stent exchange ([id number])   Additional orders: N/A  -Admit type: OUTpatient  -Anesthesia: General  -VTE Prophylaxis Standing Order SCD's       Other:   -Standing Lab Orders Per Anesthesia    Lab other: None  -Standing Test orders EKG/Chest x-ray per Anesthesia       Test other:   - Medications:  Cipro 400mg  IV  -Other orders:  N/A

## 2023-03-12 NOTE — Discharge Instructions (Signed)

## 2023-03-12 NOTE — Transfer of Care (Signed)
Immediate Anesthesia Transfer of Care Note  Patient: Ashlee Fields  Procedure(s) Performed: CYSTOSCOPY WITH STENT PLACEMENT (Bilateral)  Patient Location: PACU  Anesthesia Type:General  Level of Consciousness: drowsy  Airway & Oxygen Therapy: Patient Spontanous Breathing and Patient connected to face mask oxygen  Post-op Assessment: Report given to RN  Post vital signs: stable  Last Vitals:  Vitals Value Taken Time  BP 153/80 03/12/23 1633  Temp 36.1 C 03/12/23 1634  Pulse 60 03/12/23 1637  Resp 13 03/12/23 1637  SpO2 100 % 03/12/23 1637  Vitals shown include unvalidated device data.  Last Pain:  Vitals:   03/12/23 1337  TempSrc: Temporal  PainSc: 0-No pain         Complications: No notable events documented.

## 2023-03-12 NOTE — Op Note (Signed)
Date of procedure: 03/12/23  Preoperative diagnosis:  Left ureteral stone Right UPJ stone UTI  Postoperative diagnosis:  Same  Procedure: Cystoscopy Left ureteral stent placement Right ureteral stent placement  Surgeon: Legrand Rams, MD  Anesthesia: General  Complications: None  Intraoperative findings:  Normal bladder, uncomplicated bilateral ureteral stent placement with cloudy drainage  EBL: Minimal  Specimens: None  Drains: Bilateral 6 French by 24 cm ureteral stents  Indication: Ashlee Fields is a 67 y.o. patient who presented to clinic earlier today with malaise and left-sided flank pain and urinalysis appeared infected, a CT was ordered showing a 8 mm left proximal ureteral stone with hydronephrosis as well as a 5 mm right UPJ stone, and she opted for bilateral ureteral stent placement.  After reviewing the management options for treatment, they elected to proceed with the above surgical procedure(s). We have discussed the potential benefits and risks of the procedure, side effects of the proposed treatment, the likelihood of the patient achieving the goals of the procedure, and any potential problems that might occur during the procedure or recuperation. Informed consent has been obtained.  Description of procedure:  The patient was taken to the operating room and general anesthesia was induced. SCDs were placed for DVT prophylaxis. The patient was placed in the dorsal lithotomy position, prepped and draped in the usual sterile fashion, and preoperative antibiotics(Cipro) were administered. A preoperative time-out was performed.   A 21 French rigid cystoscope was used to intubate the urethra and thorough cystoscopy was performed.  The bladder was grossly normal throughout, and the ureteral orifices orthotopic bilaterally.    A sensor wire advanced easily into the left ureteral orifice and passed alongside the stone up to the kidney under fluoroscopic vision.  A 6  French by 24 cm ureteral stent advanced easily over the wire with a curl in the renal pelvis as well as in the bladder, and cloudy drainage through the sideport of the stent.  I turned my attention to the right ureteral orifice and the sensor wire advanced easily up to the kidney under fluoroscopic vision.  A 6 French by 24 cm ureteral stent was advanced with a curl in the renal pelvis as well as the bladder, and cloudy urine drained through the sideport of the stent.  The bladder was drained and this concluded our procedure.  Disposition: Stable to PACU  Plan: Discharge home today if remains afebrile and normotensive Cipro 500 mg twice daily x 10 days, follow-up cultures Will coordinate outpatient bilateral ureteroscopy, laser lithotripsy, stent change in 2 to 4 weeks  Legrand Rams, MD

## 2023-03-12 NOTE — Progress Notes (Signed)
   [facility] Surgical Posting Form  Surgery Date: Date: 03/12/23-Emergent  Surgeon: Dr. Legrand Rams, MD  Inpt ( No  )   Outpt (Yes)   Obs ( No  )   Diagnosis: N20.1 Bilateral Ureteral Stones  -CPT: 16109  Surgery: Bilateral Cystoscopy with Stent Placement  Stop Anticoagulations: No, may continue all  Cardiac/Medical/Pulmonary Clearance needed: no  *Orders entered into EPIC  Date: 03/12/23   *Case booked in EPIC  Date: 03/12/23  *Notified pt of Surgery: Date: 03/12/23  PRE-OP UA & CX: no  *Placed into Prior Authorization Work [place] Date: 03/12/23  Assistant/laser/rep:No

## 2023-03-12 NOTE — Anesthesia Postprocedure Evaluation (Signed)
Anesthesia Post Note  Patient: Ashlee Fields  Procedure(s) Performed: CYSTOSCOPY WITH STENT PLACEMENT (Bilateral)  Patient location during evaluation: PACU Anesthesia Type: General Level of consciousness: awake and alert Pain management: pain level controlled Vital Signs Assessment: post-procedure vital signs reviewed and stable Respiratory status: spontaneous breathing, nonlabored ventilation, respiratory function stable and patient connected to nasal cannula oxygen Cardiovascular status: blood pressure returned to baseline and stable Postop Assessment: no apparent nausea or vomiting Anesthetic complications: no   No notable events documented.   Last Vitals:  Vitals:   03/12/23 1700 03/12/23 1710  BP: (!) 105/55 132/85  Pulse: 71 69  Resp: 18 16  Temp: (!) 36.1 C (!) 36.3 C  SpO2: 96% 96%    Last Pain:  Vitals:   03/12/23 1710  TempSrc: Temporal  PainSc: 0-No pain                 Cleda Mccreedy Naji Mehringer

## 2023-03-12 NOTE — Anesthesia Procedure Notes (Signed)
Procedure Name: Intubation Date/Time: 03/12/2023 4:11 PM  Performed by: Jaye Beagle, CRNAPre-anesthesia Checklist: Patient identified, Emergency Drugs available, Suction available and Patient being monitored Patient Re-evaluated:Patient Re-evaluated prior to induction Oxygen Delivery Method: Circle system utilized Preoxygenation: Pre-oxygenation with 100% oxygen Induction Type: IV induction, Cricoid Pressure applied and Rapid sequence Laryngoscope Size: McGraph and 3 Grade View: Grade I Tube type: Oral Number of attempts: 1 Airway Equipment and Method: Stylet Placement Confirmation: ETT inserted through vocal cords under direct vision, positive ETCO2 and breath sounds checked- equal and bilateral Secured at: 22 cm Tube secured with: Tape Dental Injury: Teeth and Oropharynx as per pre-operative assessment

## 2023-03-13 ENCOUNTER — Encounter: Payer: Self-pay | Admitting: Urology

## 2023-03-16 ENCOUNTER — Telehealth: Payer: Self-pay

## 2023-03-16 DIAGNOSIS — R3915 Urgency of urination: Secondary | ICD-10-CM

## 2023-03-16 LAB — CULTURE, URINE COMPREHENSIVE

## 2023-03-16 MED ORDER — GEMTESA 75 MG PO TABS
1.0000 | ORAL_TABLET | Freq: Every day | ORAL | 0 refills | Status: DC
Start: 2023-03-16 — End: 2023-04-16

## 2023-03-16 NOTE — Telephone Encounter (Signed)
I spoke with Ashlee Fields. We have discussed possible surgery dates and Friday May 10th, 2024 was agreed upon by all parties. Patient given information about surgery date, what to expect pre-operatively and post operatively.  We discussed that a Pre-Admission Testing office will be calling to set up the pre-op visit that will take place prior to surgery, and that these appointments are typically done over the phone with a Pre-Admissions RN. Informed patient that our office will communicate any additional care to be provided after surgery. Patients questions or concerns were discussed during our call. Advised to call our office should there be any additional information, questions or concerns that arise. Patient verbalized understanding.

## 2023-03-16 NOTE — Telephone Encounter (Signed)
Patient called, she had stent placement on 03/12/23. She is taking her antibiotic, AZO OTC and tylenol/ibuprofen as needed. Patient is asking if there is anything that can help her urinary urgency, "it is driving me crazy."

## 2023-03-16 NOTE — Progress Notes (Addendum)
   [facility] Surgical Posting Form  Surgery Date: Date: 04/24/2023  Surgeon: Dr. Legrand Rams, MD  Inpt ( No  )   Outpt (Yes)   Obs ( No  )   Diagnosis: N20.1 Right Ureteral Stone, Left Ureteral Stone  -CPT: [phone]  Surgery: Bilateral Ureteroscopy with Laser Lithotripsy and Stent Exchange  Stop Anticoagulations: No, may continue ASA  Cardiac/Medical/Pulmonary Clearance needed: no  *Orders entered into EPIC  Date: 04/06/2023  *Case booked in EPIC  Date:04/06/2023  *Notified pt of Surgery: Date: 04/06/2023   PRE-OP UA & CX: no  *Placed into Prior Authorization Work [place] Date: 04/06/2023  Assistant/laser/rep:No

## 2023-03-16 NOTE — Telephone Encounter (Signed)
Called pt she states that urgency is getting better and she is unsure if she should pick up samples. Advised pt that ultimately this is her decision and it depends on the severity of her symptoms and ability to tolerate. Patient questions if sample medications are the same as AZO. Counseled pt that medications such as Myrbetriq and Gemtesa are not the same as AZO as it is mainly for dysuria. Also counseled pt that AZO cannot be taken long term. Pt requests samples be placed at front desk at [facility] location office and she may pick them up today.

## 2023-03-16 NOTE — Telephone Encounter (Signed)
Noted, samples of Gemtesa placed up front for the patient.

## 2023-03-17 ENCOUNTER — Telehealth: Payer: Self-pay

## 2023-03-17 MED ORDER — NITROFURANTOIN MONOHYD MACRO 100 MG PO CAPS: 100.0000 mg | ORAL_CAPSULE | Freq: Two times a day (BID) | ORAL | 0 refills | Status: DC

## 2023-03-17 NOTE — Telephone Encounter (Signed)
Left detailed message on machine for patient to start new abx. I have sent in to [facility] on [address] in [place].

## 2023-03-17 NOTE — Telephone Encounter (Signed)
-----   Message from Sondra Come, MD sent at 03/17/2023  8:13 AM EDT ----- Her culture was resistant to Cipro.  Please change to nitrofurantoin 100 mg twice daily x 7 days, this may improve some of her urinary symptoms as well  Legrand Rams, MD 03/17/2023

## 2023-03-21 ENCOUNTER — Ambulatory Visit: Payer: Medicare HMO | Admitting: Urgent Care

## 2023-03-21 ENCOUNTER — Ambulatory Visit: Payer: Medicare HMO | Admitting: Anesthesiology

## 2023-03-23 ENCOUNTER — Encounter: Payer: Self-pay | Admitting: Urology

## 2023-03-26 ENCOUNTER — Encounter
Admission: RE | Admit: 2023-03-26 | Discharge: 2023-03-26 | Disposition: A | Discharge status: Home / Self Care | Payer: Medicare HMO | Source: Ambulatory Visit | Attending: Urology | Admitting: Urology

## 2023-03-26 VITALS — Ht 63.0 in | Wt 180.0 lb

## 2023-03-26 DIAGNOSIS — Z01812 Encounter for preprocedural laboratory examination: Secondary | ICD-10-CM

## 2023-03-26 HISTORY — DX: Radiculopathy, lumbar region: M54.16

## 2023-03-26 HISTORY — DX: Hematuria, unspecified: R31.9

## 2023-03-26 HISTORY — DX: Calculus of ureter: N20.1

## 2023-03-26 HISTORY — DX: Hemiplegia, unspecified affecting left nondominant side: G81.94

## 2023-03-26 HISTORY — DX: Pulmonary hypertension, unspecified: I27.20

## 2023-03-26 HISTORY — DX: Chronic obstructive pulmonary disease, unspecified: J44.9

## 2023-03-26 HISTORY — DX: Pneumonia, unspecified organism: J18.9

## 2023-03-26 HISTORY — DX: Hyperlipidemia, unspecified: E78.5

## 2023-03-26 HISTORY — DX: Streptococcus pneumoniae as the cause of diseases classified elsewhere: B95.3

## 2023-03-26 HISTORY — DX: Psoriasis, unspecified: L40.9

## 2023-03-26 HISTORY — DX: Diffuse cystic mastopathy of unspecified breast: N60.19

## 2023-03-26 NOTE — Patient Instructions (Addendum)
Your procedure is scheduled on: Friday, May 10 Report to the Registration Desk on the 1st floor of the [facility] To find out your arrival time, please call [phone] between 1PM - 3PM on: Thursday, May 9 If your arrival time is 6:00 am, do not arrive before that time as the [facility] entrance doors do not open until 6:00 am.  REMEMBER: Instructions that are not followed completely may result in serious medical risk, up to and including death; or upon the discretion of your surgeon and anesthesiologist your surgery may need to be rescheduled.  Do not eat or drink after midnight the night before surgery.  No gum chewing or hard candies.  One week prior to surgery: starting May 3 Stop Anti-inflammatories (NSAIDS) such as Advil, Aleve, Ibuprofen, Motrin, Naproxen, Naprosyn and Aspirin based products such as Excedrin, Goody's Powder, BC Powder. Stop ANY OVER THE COUNTER supplements until after surgery. Stop vitamin C, calcium, vitamin D, multiple vitamins You may however, continue to take Tylenol if needed for pain up until the day of surgery.  Continue taking all prescribed medications with the exception of the following:  Ozempic - hold for 7 days before surgery. Do NOT take dose on Wednesday, May 8. Resume AFTER surgery on your regular day, Wednesday, May 15.  Metformin - hold for 2 days before surgery. Last day to take is Tuesday, May 7. Resume AFTER surgery.  TAKE ONLY THESE MEDICATIONS THE MORNING OF SURGERY WITH A SIP OF WATER:  Abilify Fluoxetine (Prozac) Levothyroxine Pantoprazole (Protonix) (take one the night before and one on the morning of surgery - helps to prevent nausea after surgery.) Prednisone  Use albuterol inhaler on the day of surgery and bring to the hospital.  No Alcohol for 24 hours before or after surgery.  No Smoking including e-cigarettes for 24 hours before surgery.  No chewable tobacco products for at least 6 hours before surgery.  No nicotine  patches on the day of surgery.  Do not use any "recreational" drugs for at least a week (preferably 2 weeks) before your surgery.  Please be advised that the combination of cocaine and anesthesia may have negative outcomes, up to and including death. If you test positive for cocaine, your surgery will be cancelled.  On the morning of surgery brush your teeth with toothpaste and water, you may rinse your mouth with mouthwash if you wish. Do not swallow any toothpaste or mouthwash.  Do not wear jewelry, make-up, hairpins, clips or nail polish.  Do not wear lotions, powders, or perfumes.   Do not shave body hair from the neck down 48 hours before surgery.  Contact lenses, hearing aids and dentures may not be worn into surgery.  Do not bring valuables to the hospital. [facility] is not responsible for any missing/lost belongings or valuables.   Notify your doctor if there is any change in your medical condition (cold, fever, infection).  Wear comfortable clothing (specific to your surgery type) to the hospital.  After surgery, you can help prevent lung complications by doing breathing exercises.  Take deep breaths and cough every 1-2 hours. Your doctor may order a device called an Incentive Spirometer to help you take deep breaths.  If you are being discharged the day of surgery, you will not be allowed to drive home. You will need a responsible individual to drive you home and stay with you for 24 hours after surgery.   If you are taking public transportation, you will need to  have a responsible individual with you.  Please call the [place] Dept. at [phone] if you have any questions about these instructions.  Surgery Visitation Policy:  Patients having surgery or a procedure may have two visitors.  Children under the age of 75 must have an adult with them who is not the patient.

## 2023-04-02 MED ORDER — CHLORHEXIDINE GLUCONATE 0.12 % MT SOLN
15.0000 mL | Freq: Once | OROMUCOSAL | Status: AC
  Administered 2023-04-03: 15 mL via OROMUCOSAL

## 2023-04-02 MED ORDER — CIPROFLOXACIN IN D5W 400 MG/200ML IV SOLN: 400.0000 mg | INTRAVENOUS | Status: DC

## 2023-04-02 MED ORDER — SODIUM CHLORIDE 0.9 % IV SOLN: INTRAVENOUS | Status: DC

## 2023-04-02 MED ORDER — ORAL CARE MOUTH RINSE: 15.0000 mL | Freq: Once | OROMUCOSAL | Status: AC

## 2023-04-03 ENCOUNTER — Encounter: Payer: Self-pay | Admitting: Urology

## 2023-04-03 ENCOUNTER — Ambulatory Visit
Admission: RE | Admit: 2023-04-03 | Discharge: 2023-04-03 | Disposition: A | Discharge status: Home / Self Care | Payer: Medicare HMO | Source: Ambulatory Visit | Attending: Urology | Admitting: Urology

## 2023-04-03 ENCOUNTER — Encounter
Admission: RE | Disposition: A | Discharge status: Home / Self Care | Payer: Self-pay | Source: Ambulatory Visit | Attending: Urology

## 2023-04-03 ENCOUNTER — Ambulatory Visit: Payer: Medicare HMO

## 2023-04-03 DIAGNOSIS — R42 Dizziness and giddiness: Secondary | ICD-10-CM | POA: Insufficient documentation

## 2023-04-03 DIAGNOSIS — G4730 Sleep apnea, unspecified: Secondary | ICD-10-CM | POA: Insufficient documentation

## 2023-04-03 DIAGNOSIS — K219 Gastro-esophageal reflux disease without esophagitis: Secondary | ICD-10-CM | POA: Diagnosis not present

## 2023-04-03 DIAGNOSIS — N201 Calculus of ureter: Secondary | ICD-10-CM | POA: Insufficient documentation

## 2023-04-03 DIAGNOSIS — Z96651 Presence of right artificial knee joint: Secondary | ICD-10-CM | POA: Insufficient documentation

## 2023-04-03 DIAGNOSIS — Z87891 Personal history of nicotine dependence: Secondary | ICD-10-CM | POA: Insufficient documentation

## 2023-04-03 DIAGNOSIS — Z538 Procedure and treatment not carried out for other reasons: Secondary | ICD-10-CM | POA: Diagnosis not present

## 2023-04-03 DIAGNOSIS — G35 Multiple sclerosis: Secondary | ICD-10-CM | POA: Diagnosis not present

## 2023-04-03 DIAGNOSIS — J449 Chronic obstructive pulmonary disease, unspecified: Secondary | ICD-10-CM | POA: Insufficient documentation

## 2023-04-03 DIAGNOSIS — I10 Essential (primary) hypertension: Secondary | ICD-10-CM | POA: Insufficient documentation

## 2023-04-03 DIAGNOSIS — Z01812 Encounter for preprocedural laboratory examination: Secondary | ICD-10-CM

## 2023-04-03 DIAGNOSIS — Z79899 Other long term (current) drug therapy: Secondary | ICD-10-CM | POA: Insufficient documentation

## 2023-04-03 DIAGNOSIS — E119 Type 2 diabetes mellitus without complications: Secondary | ICD-10-CM | POA: Insufficient documentation

## 2023-04-03 LAB — POCT I-STAT, CHEM 8
BUN: 15 mg/dL (ref 8–23)
Calcium, Ion: 1.2 mmol/L (ref 1.15–1.40)
Chloride: 103 mmol/L (ref 98–111)
Creatinine, Ser: 0.9 mg/dL (ref 0.44–1.00)
Glucose, Bld: 183 mg/dL — ABNORMAL HIGH (ref 70–99)
HCT: 33 % — ABNORMAL LOW (ref 36.0–46.0)
Hemoglobin: 11.2 g/dL — ABNORMAL LOW (ref 12.0–15.0)
Potassium: 4.3 mmol/L (ref 3.5–5.1)
Sodium: 136 mmol/L (ref 135–145)
TCO2: 22 mmol/L (ref 22–32)

## 2023-04-03 SURGERY — CYSTOSCOPY/URETEROSCOPY/HOLMIUM LASER/STENT PLACEMENT
Anesthesia: General | Laterality: Bilateral

## 2023-04-03 MED ORDER — NITROGLYCERIN IN D5W 200-5 MCG/ML-% IV SOLN
INTRAVENOUS | Status: AC
  Filled 2023-04-03: qty 250

## 2023-04-03 MED ORDER — CIPROFLOXACIN IN D5W 400 MG/200ML IV SOLN
INTRAVENOUS | Status: AC
  Filled 2023-04-03: qty 200

## 2023-04-03 MED ORDER — CHLORHEXIDINE GLUCONATE 0.12 % MT SOLN
OROMUCOSAL | Status: AC
  Filled 2023-04-03: qty 15

## 2023-04-03 MED ORDER — ALBUMIN HUMAN 5 % IV SOLN
INTRAVENOUS | Status: AC
  Filled 2023-04-03: qty 250

## 2023-04-03 MED ORDER — NOREPINEPHRINE 4 MG/250ML-% IV SOLN
INTRAVENOUS | Status: AC
  Filled 2023-04-03: qty 250

## 2023-04-03 SURGICAL SUPPLY — 29 items
ADHESIVE MASTISOL STRL (MISCELLANEOUS) IMPLANT
BAG DRAIN SIEMENS DORNER NS (MISCELLANEOUS) ×1 IMPLANT
BAG PRESSURE INF REUSE 3000 (BAG) ×1 IMPLANT
BRUSH SCRUB EZ 1% IODOPHOR (MISCELLANEOUS) ×1 IMPLANT
CATH URET FLEX-TIP 2 LUMEN 10F (CATHETERS) IMPLANT
CATH URETL OPEN 5X70 (CATHETERS) IMPLANT
CNTNR URN SCR LID CUP LEK RST (MISCELLANEOUS) IMPLANT
CONT SPEC 4OZ STRL OR WHT (MISCELLANEOUS)
DRAPE UTILITY 15X26 TOWEL STRL (DRAPES) ×1 IMPLANT
DRSG TEGADERM 2-3/8X2-3/4 SM (GAUZE/BANDAGES/DRESSINGS) IMPLANT
FIBER LASER MOSES 200 DFL (Laser) IMPLANT
GLOVE BIOGEL PI IND STRL 7.5 (GLOVE) ×1 IMPLANT
GOWN STRL REUS W/ TWL LRG LVL3 (GOWN DISPOSABLE) ×1 IMPLANT
GOWN STRL REUS W/ TWL XL LVL3 (GOWN DISPOSABLE) ×1 IMPLANT
GOWN STRL REUS W/TWL LRG LVL3 (GOWN DISPOSABLE) ×1
GOWN STRL REUS W/TWL XL LVL3 (GOWN DISPOSABLE) ×1
GUIDEWIRE STR DUAL SENSOR (WIRE) ×1 IMPLANT
IV NS IRRIG 3000ML ARTHROMATIC (IV SOLUTION) ×1 IMPLANT
KIT TURNOVER CYSTO (KITS) ×1 IMPLANT
PACK CYSTO AR (MISCELLANEOUS) ×1 IMPLANT
SET CYSTO W/LG BORE CLAMP LF (SET/KITS/TRAYS/PACK) ×1 IMPLANT
SHEATH NAVIGATOR HD 12/14X36 (SHEATH) IMPLANT
STENT URET 6FRX24 CONTOUR (STENTS) IMPLANT
STENT URET 6FRX26 CONTOUR (STENTS) IMPLANT
SURGILUBE 2OZ TUBE FLIPTOP (MISCELLANEOUS) ×1 IMPLANT
SYR 10ML LL (SYRINGE) ×1 IMPLANT
TRAP FLUID SMOKE EVACUATOR (MISCELLANEOUS) ×1 IMPLANT
VALVE UROSEAL ADJ ENDO (VALVE) IMPLANT
WATER STERILE IRR 500ML POUR (IV SOLUTION) ×1 IMPLANT

## 2023-04-03 NOTE — Progress Notes (Signed)
Pt arrived via wheelchair to [facility] from home, pt c/o dizziness and stated that she feels drunk. Transferred to stretcher with 2 assist. VSS, blood sugar and serum potassium drawn results given to anesthesia.

## 2023-04-03 NOTE — Anesthesia Preprocedure Evaluation (Signed)
Anesthesia Evaluation  Patient identified by MRN, date of birth, ID band Patient awake    Reviewed: Allergy & Precautions, NPO status , Patient's Chart, lab work & pertinent test results  Airway Mallampati: II  TM Distance: >3 FB Neck ROM: full    Dental  (+) Teeth Intact   Pulmonary neg pulmonary ROS, sleep apnea , pneumonia, former smoker   Pulmonary exam normal  + decreased breath sounds      Cardiovascular Exercise Tolerance: Poor hypertension, Pt. on medications + DOE  negative cardio ROS Normal cardiovascular exam Rhythm:Regular Rate:Normal  Severe pulmonary HTN   Neuro/Psych  PSYCHIATRIC DISORDERS Anxiety Depression    MS  Neuromuscular disease negative neurological ROS  negative psych ROS   GI/Hepatic negative GI ROS, Neg liver ROS,GERD  Medicated,,  Endo/Other  negative endocrine ROSdiabetes, Type 2Hypothyroidism  Morbid obesity  Renal/GU Renal disease     Musculoskeletal  (+) Arthritis ,    Abdominal  (+) + obese  Peds negative pediatric ROS (+)  Hematology negative hematology ROS (+) Blood dyscrasia, anemia   Anesthesia Other Findings Past Medical History: No date: Anxiety No date: Arthritis No date: Bacteremia due to Streptococcus pneumoniae No date: COPD (chronic obstructive pulmonary disease) (HCC) No date: Depression No date: Diabetes mellitus without complication (HCC) No date: Fibrocystic breast disease No date: GERD (gastroesophageal reflux disease) No date: Hematuria No date: Hyperlipidemia No date: Hypertension No date: Hypothyroidism No date: Left hemiplegia (HCC) No date: Lumbar radiculopathy 1980: Multiple sclerosis (HCC) 11/22/2022: Neutropenia (HCC) No date: Osteoporosis No date: Pneumonia No date: Psoriasis No date: Pulmonary hypertension (HCC) No date: Sleep apnea No date: Ureteral stone No date: Vision abnormalities  Past Surgical History: 1986: CESAREAN SECTION No  date: COLONOSCOPY 03/12/2023: CYSTOSCOPY WITH STENT PLACEMENT; Bilateral     Comment:  Procedure: CYSTOSCOPY WITH STENT PLACEMENT;  Surgeon:               Sondra Come, MD;  Location: [facility] ORS;  Service:               Urology;  Laterality: Bilateral; 11/20/2020: ESOPHAGOGASTRODUODENOSCOPY (EGD) WITH PROPOFOL; N/A     Comment:  Procedure: ESOPHAGOGASTRODUODENOSCOPY (EGD) WITH               PROPOFOL;  Surgeon: Regis Bill, MD;  Location:               [facility] ENDOSCOPY;  Service: Endoscopy;  Laterality: N/A; No date: KNEE ARTHROSCOPY; Left 2009: REPLACEMENT TOTAL KNEE; Left 08/05/2017: RIGHT HEART CATH; Right     Comment:  Procedure: RIGHT HEART CATH;  Surgeon: Dalia Heading,               MD;  Location: [facility] INVASIVE CV LAB;  Service:               Cardiovascular;  Laterality: Right; 02/28/2020: TOTAL KNEE ARTHROPLASTY; Right     Comment:  Procedure: TOTAL KNEE ARTHROPLASTY;  Surgeon: Juanell Fairly, MD;  Location: [facility] ORS;  Service: Orthopedics;                Laterality: Right;  BMI    Body Mass Index: 31.89 kg/m      Reproductive/Obstetrics negative OB ROS                             Anesthesia Physical Anesthesia Plan  ASA:  3  Anesthesia Plan: General   Post-op Pain Management:    Induction: Intravenous  PONV Risk Score and Plan: Ondansetron, Dexamethasone, Midazolam and Treatment may vary due to age or medical condition  Airway Management Planned: Oral ETT  Additional Equipment:   Intra-op Plan:   Post-operative Plan: Extubation in OR  Informed Consent: I have reviewed the patients History and Physical, chart, labs and discussed the procedure including the risks, benefits and alternatives for the proposed anesthesia with the patient or authorized representative who has indicated his/her understanding and acceptance.     Dental Advisory Given  Plan Discussed with: CRNA and Surgeon  Anesthesia Plan Comments:         Anesthesia Quick Evaluation

## 2023-04-03 NOTE — Progress Notes (Signed)
Previously stented for bilateral ureteral stones with infection.  Here today for bilateral ureteroscopy, laser lithotripsy, stent placement  Presented to preop with new onset dizziness today and blood pressures initially 90/50.  She denies any urinary or UTI type symptoms.  Discussed with Dr. Darleene Cleaver who recommend canceling elective surgery today and medicine workup for further evaluation.  I had a long conversation with the patient recommending medicine evaluation and potentially admission, and patient and her husband deferred and would like to go home.  She is feeling better and most recent blood pressure was 110/75.  She denied any chest pain, shortness of breath, or persistent dizziness.  Return precautions discussed extensively.  Will repeat urine culture, and reschedule bilateral ureteroscopy for 2 to 4 weeks  Legrand Rams, MD 04/03/2023

## 2023-04-06 ENCOUNTER — Telehealth: Payer: Self-pay

## 2023-04-06 LAB — GLUCOSE, CAPILLARY: Glucose-Capillary: 185 mg/dL — ABNORMAL HIGH (ref 70–99)

## 2023-04-06 NOTE — Telephone Encounter (Signed)
Patient called stating that her surgery for cystoscopy/stent exchange has been postponed to 04/24/23. She wanted to let Sam know and also asked if she even needs to proceed with Botox injection scheduled with Dr Sherron Monday on 05/04/23 since Leslye Peer is working great for her symptoms, she is not sure what the cost will be for this medication though? Also, if she does need to do Botox injection should she reschedule further out since her surgery is close to that date?

## 2023-04-06 NOTE — Addendum Note (Signed)
Addended by: Letta Kocher A on: 04/06/2023 08:47 AM   Modules accepted: Orders

## 2023-04-07 NOTE — Telephone Encounter (Signed)
Yes, let's cancel her Botox appointment for now in light of her upcoming surgery. We can provide her with Gemtesa samples in the short term if needed. We can discuss rebooking Botox at her postop follow-up if needed at that time.

## 2023-04-09 NOTE — Telephone Encounter (Signed)
Spoke with patient and she would like to cancel botox until she is done with lithotripsy. Pt states the gemtesa is working great.  Botox cancel and lab

## 2023-04-09 NOTE — Telephone Encounter (Signed)
Pt called stating she would like to reschedule her botox appointment that is on 05/04/2023, states after lithotripsy, she is in a lot of pain and has a lot of blood in her urine. She is scheduled to have lithotripsy on 04/24/2023. Pt also states that gemtesa is helping with urinary symptoms. Botox appointment will be changed until further noticed.

## 2023-04-16 ENCOUNTER — Telehealth: Payer: Self-pay

## 2023-04-16 DIAGNOSIS — R3915 Urgency of urination: Secondary | ICD-10-CM

## 2023-04-16 MED ORDER — GEMTESA 75 MG PO TABS: 1.0000 | ORAL_TABLET | Freq: Every day | ORAL | 11 refills | Status: DC

## 2023-04-16 NOTE — Telephone Encounter (Signed)
Patient called in and left a voicemail. Patient states that she has been taking the Gemtesa Samples and is working really well for her. She wanted to know if she could get future samples/Rx for this.

## 2023-04-16 NOTE — Telephone Encounter (Signed)
Left message on machine with detailed results, advised tocall if any questions.   

## 2023-04-16 NOTE — Telephone Encounter (Signed)
Ashlee Fields rx sent to [facility] on [address]

## 2023-04-17 NOTE — Telephone Encounter (Signed)
Sent for tier exception on CMM, waiting for reply

## 2023-04-21 ENCOUNTER — Encounter
Admission: RE | Admit: 2023-04-21 | Discharge: 2023-04-21 | Disposition: A | Discharge status: Home / Self Care | Payer: Medicare HMO | Source: Ambulatory Visit | Attending: Urology | Admitting: Urology

## 2023-04-24 ENCOUNTER — Encounter
Admission: RE | Disposition: A | Discharge status: Home / Self Care | Payer: Self-pay | Source: Ambulatory Visit | Attending: Urology

## 2023-04-24 ENCOUNTER — Encounter: Payer: Self-pay | Admitting: Urology

## 2023-04-24 ENCOUNTER — Other Ambulatory Visit: Payer: Self-pay

## 2023-04-24 ENCOUNTER — Ambulatory Visit: Payer: Medicare HMO

## 2023-04-24 ENCOUNTER — Ambulatory Visit: Payer: Medicare HMO | Admitting: Anesthesiology

## 2023-04-24 ENCOUNTER — Ambulatory Visit
Admission: RE | Admit: 2023-04-24 | Discharge: 2023-04-24 | Disposition: A | Discharge status: Home / Self Care | Payer: Medicare HMO | Source: Ambulatory Visit | Attending: Urology | Admitting: Urology

## 2023-04-24 ENCOUNTER — Other Ambulatory Visit: Payer: Medicare HMO

## 2023-04-24 DIAGNOSIS — Z01812 Encounter for preprocedural laboratory examination: Secondary | ICD-10-CM

## 2023-04-24 DIAGNOSIS — N3281 Overactive bladder: Secondary | ICD-10-CM | POA: Insufficient documentation

## 2023-04-24 DIAGNOSIS — E785 Hyperlipidemia, unspecified: Secondary | ICD-10-CM | POA: Insufficient documentation

## 2023-04-24 DIAGNOSIS — E119 Type 2 diabetes mellitus without complications: Secondary | ICD-10-CM | POA: Diagnosis not present

## 2023-04-24 DIAGNOSIS — I272 Pulmonary hypertension, unspecified: Secondary | ICD-10-CM | POA: Insufficient documentation

## 2023-04-24 DIAGNOSIS — M810 Age-related osteoporosis without current pathological fracture: Secondary | ICD-10-CM | POA: Diagnosis not present

## 2023-04-24 DIAGNOSIS — N201 Calculus of ureter: Secondary | ICD-10-CM

## 2023-04-24 DIAGNOSIS — F419 Anxiety disorder, unspecified: Secondary | ICD-10-CM | POA: Insufficient documentation

## 2023-04-24 DIAGNOSIS — E039 Hypothyroidism, unspecified: Secondary | ICD-10-CM | POA: Diagnosis not present

## 2023-04-24 DIAGNOSIS — M1990 Unspecified osteoarthritis, unspecified site: Secondary | ICD-10-CM | POA: Diagnosis not present

## 2023-04-24 DIAGNOSIS — F32A Depression, unspecified: Secondary | ICD-10-CM | POA: Diagnosis not present

## 2023-04-24 DIAGNOSIS — Z87440 Personal history of urinary (tract) infections: Secondary | ICD-10-CM | POA: Diagnosis not present

## 2023-04-24 DIAGNOSIS — K219 Gastro-esophageal reflux disease without esophagitis: Secondary | ICD-10-CM | POA: Diagnosis not present

## 2023-04-24 DIAGNOSIS — N6019 Diffuse cystic mastopathy of unspecified breast: Secondary | ICD-10-CM | POA: Insufficient documentation

## 2023-04-24 DIAGNOSIS — G8194 Hemiplegia, unspecified affecting left nondominant side: Secondary | ICD-10-CM | POA: Insufficient documentation

## 2023-04-24 DIAGNOSIS — Z87891 Personal history of nicotine dependence: Secondary | ICD-10-CM | POA: Insufficient documentation

## 2023-04-24 DIAGNOSIS — I10 Essential (primary) hypertension: Secondary | ICD-10-CM | POA: Diagnosis not present

## 2023-04-24 DIAGNOSIS — G4730 Sleep apnea, unspecified: Secondary | ICD-10-CM | POA: Insufficient documentation

## 2023-04-24 DIAGNOSIS — J449 Chronic obstructive pulmonary disease, unspecified: Secondary | ICD-10-CM | POA: Diagnosis not present

## 2023-04-24 DIAGNOSIS — G35 Multiple sclerosis: Secondary | ICD-10-CM | POA: Diagnosis not present

## 2023-04-24 DIAGNOSIS — I083 Combined rheumatic disorders of mitral, aortic and tricuspid valves: Secondary | ICD-10-CM | POA: Insufficient documentation

## 2023-04-24 HISTORY — PX: CYSTOSCOPY/URETEROSCOPY/HOLMIUM LASER/STENT PLACEMENT: SHX6546

## 2023-04-24 LAB — GLUCOSE, CAPILLARY
Glucose-Capillary: 195 mg/dL — ABNORMAL HIGH (ref 70–99)
Glucose-Capillary: 207 mg/dL — ABNORMAL HIGH (ref 70–99)

## 2023-04-24 SURGERY — CYSTOSCOPY/URETEROSCOPY/HOLMIUM LASER/STENT PLACEMENT
Anesthesia: General | Laterality: Bilateral

## 2023-04-24 MED ORDER — CIPROFLOXACIN IN D5W 400 MG/200ML IV SOLN
INTRAVENOUS | Status: AC
  Filled 2023-04-24: qty 200

## 2023-04-24 MED ORDER — VANCOMYCIN HCL IN DEXTROSE 1-5 GM/200ML-% IV SOLN
INTRAVENOUS | Status: AC
  Filled 2023-04-24: qty 200

## 2023-04-24 MED ORDER — DEXAMETHASONE SODIUM PHOSPHATE 10 MG/ML IJ SOLN
INTRAMUSCULAR | Status: DC | PRN
  Administered 2023-04-24: 10 mg via INTRAVENOUS

## 2023-04-24 MED ORDER — ONDANSETRON HCL 4 MG/2ML IJ SOLN
INTRAMUSCULAR | Status: DC | PRN
  Administered 2023-04-24: 4 mg via INTRAVENOUS

## 2023-04-24 MED ORDER — LIDOCAINE HCL (CARDIAC) PF 100 MG/5ML IV SOSY
PREFILLED_SYRINGE | INTRAVENOUS | Status: DC | PRN
  Administered 2023-04-24: 100 mg via INTRAVENOUS

## 2023-04-24 MED ORDER — FENTANYL CITRATE (PF) 100 MCG/2ML IJ SOLN
INTRAMUSCULAR | Status: AC
  Filled 2023-04-24: qty 2

## 2023-04-24 MED ORDER — CIPROFLOXACIN IN D5W 400 MG/200ML IV SOLN
400.0000 mg | INTRAVENOUS | Status: AC
  Administered 2023-04-24: 400 mg via INTRAVENOUS

## 2023-04-24 MED ORDER — HYDROCODONE-ACETAMINOPHEN 5-325 MG PO TABS
ORAL_TABLET | ORAL | Status: AC
  Filled 2023-04-24: qty 1

## 2023-04-24 MED ORDER — CHLORHEXIDINE GLUCONATE 0.12 % MT SOLN
15.0000 mL | Freq: Once | OROMUCOSAL | Status: AC
  Administered 2023-04-24: 15 mL via OROMUCOSAL

## 2023-04-24 MED ORDER — IOHEXOL 180 MG/ML  SOLN
INTRAMUSCULAR | Status: DC | PRN
  Administered 2023-04-24: 20 mL

## 2023-04-24 MED ORDER — SODIUM CHLORIDE 0.9 % IR SOLN
Status: DC | PRN
  Administered 2023-04-24: 3000 mL

## 2023-04-24 MED ORDER — HYDROCODONE-ACETAMINOPHEN 5-325 MG PO TABS
1.0000 | ORAL_TABLET | Freq: Four times a day (QID) | ORAL | Status: DC | PRN
  Administered 2023-04-24: 1 via ORAL

## 2023-04-24 MED ORDER — SUGAMMADEX SODIUM 200 MG/2ML IV SOLN
INTRAVENOUS | Status: DC | PRN
  Administered 2023-04-24: 200 mg via INTRAVENOUS

## 2023-04-24 MED ORDER — VANCOMYCIN HCL 1000 MG IV SOLR
INTRAVENOUS | Status: DC | PRN
  Administered 2023-04-24: 1000 mg via INTRAVENOUS

## 2023-04-24 MED ORDER — HYDROCODONE-ACETAMINOPHEN 5-325 MG PO TABS: 1.0000 | ORAL_TABLET | Freq: Four times a day (QID) | ORAL | 0 refills | Status: AC | PRN

## 2023-04-24 MED ORDER — ORAL CARE MOUTH RINSE: 15.0000 mL | Freq: Once | OROMUCOSAL | Status: AC

## 2023-04-24 MED ORDER — CHLORHEXIDINE GLUCONATE 0.12 % MT SOLN
OROMUCOSAL | Status: AC
  Filled 2023-04-24: qty 15

## 2023-04-24 MED ORDER — FENTANYL CITRATE (PF) 100 MCG/2ML IJ SOLN
INTRAMUSCULAR | Status: DC | PRN
  Administered 2023-04-24 (×2): 50 ug via INTRAVENOUS

## 2023-04-24 MED ORDER — ROCURONIUM BROMIDE 100 MG/10ML IV SOLN
INTRAVENOUS | Status: DC | PRN
  Administered 2023-04-24: 50 mg via INTRAVENOUS

## 2023-04-24 MED ORDER — PROPOFOL 10 MG/ML IV BOLUS
INTRAVENOUS | Status: DC | PRN
  Administered 2023-04-24: 100 mg via INTRAVENOUS

## 2023-04-24 MED ORDER — SODIUM CHLORIDE 0.9 % IV SOLN: INTRAVENOUS | Status: DC

## 2023-04-24 SURGICAL SUPPLY — 30 items
ADH LQ OCL WTPRF AMP STRL LF (MISCELLANEOUS)
ADHESIVE MASTISOL STRL (MISCELLANEOUS) IMPLANT
BAG DRAIN SIEMENS DORNER NS (MISCELLANEOUS) ×1 IMPLANT
BAG DRN NS LF (MISCELLANEOUS) ×1
BAG PRESSURE INF REUSE 3000 (BAG) ×1 IMPLANT
BRUSH SCRUB EZ 1% IODOPHOR (MISCELLANEOUS) ×1 IMPLANT
CATH URET FLEX-TIP 2 LUMEN 10F (CATHETERS) IMPLANT
CATH URETL OPEN 5X70 (CATHETERS) IMPLANT
CNTNR URN SCR LID CUP LEK RST (MISCELLANEOUS) IMPLANT
CONT SPEC 4OZ STRL OR WHT (MISCELLANEOUS)
DRAPE UTILITY 15X26 TOWEL STRL (DRAPES) ×1 IMPLANT
DRSG TEGADERM 2-3/8X2-3/4 SM (GAUZE/BANDAGES/DRESSINGS) IMPLANT
FIBER LASER MOSES 200 DFL (Laser) IMPLANT
GLOVE BIOGEL PI IND STRL 7.5 (GLOVE) ×1 IMPLANT
GOWN STRL REUS W/ TWL LRG LVL3 (GOWN DISPOSABLE) ×1 IMPLANT
GOWN STRL REUS W/ TWL XL LVL3 (GOWN DISPOSABLE) ×1 IMPLANT
GOWN STRL REUS W/TWL LRG LVL3 (GOWN DISPOSABLE) ×1
GOWN STRL REUS W/TWL XL LVL3 (GOWN DISPOSABLE) ×1
GUIDEWIRE STR DUAL SENSOR (WIRE) ×1 IMPLANT
IV NS IRRIG 3000ML ARTHROMATIC (IV SOLUTION) ×1 IMPLANT
KIT TURNOVER CYSTO (KITS) ×1 IMPLANT
PACK CYSTO AR (MISCELLANEOUS) ×1 IMPLANT
SET CYSTO W/LG BORE CLAMP LF (SET/KITS/TRAYS/PACK) ×1 IMPLANT
SHEATH NAVIGATOR HD 12/14X36 (SHEATH) IMPLANT
STENT URET 6FRX24 CONTOUR (STENTS) IMPLANT
STENT URET 6FRX26 CONTOUR (STENTS) IMPLANT
SURGILUBE 2OZ TUBE FLIPTOP (MISCELLANEOUS) ×1 IMPLANT
SYR 10ML LL (SYRINGE) ×1 IMPLANT
VALVE UROSEAL ADJ ENDO (VALVE) IMPLANT
WATER STERILE IRR 500ML POUR (IV SOLUTION) ×1 IMPLANT

## 2023-04-24 NOTE — OR Nursing (Signed)
6 FR 24 CM  RIGHT URETER STENT REMOVED BY DR Richardo Hanks AND REPLACED WITH 6 FR 24 CM URETER STENT.

## 2023-04-24 NOTE — Discharge Instructions (Signed)
AMBULATORY SURGERY  ?DISCHARGE INSTRUCTIONS ? ? ?The drugs that you were given will stay in your system until tomorrow so for the next 24 hours you should not: ? ?Drive an automobile ?Make any legal decisions ?Drink any alcoholic beverage ? ? ?You may resume regular meals tomorrow.  Today it is better to start with liquids and gradually work up to solid foods. ? ?You may eat anything you prefer, but it is better to start with liquids, then soup and crackers, and gradually work up to solid foods. ? ? ?Please notify your doctor immediately if you have any unusual bleeding, trouble breathing, redness and pain at the surgery site, drainage, fever, or pain not relieved by medication. ? ? ? ?Additional Instructions: ? ? ? ?Please contact your physician with any problems or Same Day Surgery at [phone], Monday through Friday 6 am to 4 pm, or [facility] Main number at [phone].  ?

## 2023-04-24 NOTE — Anesthesia Preprocedure Evaluation (Signed)
Anesthesia Evaluation  Patient identified by MRN, date of birth, ID band Patient awake    Reviewed: Allergy & Precautions, NPO status , Patient's Chart, lab work & pertinent test results  History of Anesthesia Complications Negative for: history of anesthetic complications  Airway Mallampati: III  TM Distance: >3 FB Neck ROM: Full    Dental  (+) Teeth Intact   Pulmonary sleep apnea , COPD, Patient abstained from smoking.Not current smoker, former smoker   Pulmonary exam normal breath sounds clear to auscultation       Cardiovascular Exercise Tolerance: Poor METShypertension, pulmonary hypertension(-) CAD and (-) Past MI (-) dysrhythmias + Valvular Problems/Murmurs MR  Rhythm:Regular Rate:Normal - Systolic murmurs TTE 2024 (below) shows severe pulm HTN, even though cardiac cath 6 years prior showed no evidence of pulm HTN. Patient denies ever being told about this result, or being told of the need to follow up with cardiologist about this. Denies any syncope or dyspnea.   1. Left ventricular ejection fraction, by estimation, is 45 to 50%. The  left ventricle has mildly decreased function. The left ventricle  demonstrates regional wall motion abnormalities (hypokinesis of the basal  to mid septal wall). Left ventricular  diastolic parameters are indeterminate.   2. Right ventricular systolic function is normal. The right ventricular  size is normal. There is severely elevated pulmonary artery systolic  pressure. The estimated right ventricular systolic pressure is 60.2 mmHg.   3. The mitral valve is normal in structure,leaflets are mildly thickened  with mild calcification. Moderate mitral valve regurgitation. No evidence  of mitral stenosis.   4. Tricuspid valve regurgitation is moderate.   5. The aortic valve has an indeterminant number of cusps. There is mild  calcification of the aortic valve. Aortic valve regurgitation is not   visualized. Aortic valve sclerosis is present, with no evidence of aortic  valve stenosis.   6. The inferior vena cava is normal in size with greater than 50%  respiratory variability, suggesting right atrial pressure of 3 mmHg.     Neuro/Psych  PSYCHIATRIC DISORDERS Anxiety Depression    negative neurological ROS     GI/Hepatic ,GERD  Medicated and Controlled,,(+)     (-) substance abuse    Endo/Other  diabetesHypothyroidism  On GLP1 ozempic, last taken 10 days ago. Denies GI symptoms today.  Renal/GU Renal diseasenegative Renal ROS     Musculoskeletal   Abdominal   Peds  Hematology   Anesthesia Other Findings Past Medical History: No date: Anxiety No date: Arthritis No date: Bacteremia due to Streptococcus pneumoniae No date: COPD (chronic obstructive pulmonary disease) (HCC) No date: Depression No date: Diabetes mellitus without complication (HCC) No date: Fibrocystic breast disease No date: GERD (gastroesophageal reflux disease) No date: Hematuria No date: Hyperlipidemia No date: Hypertension No date: Hypothyroidism No date: Left hemiplegia (HCC) No date: Lumbar radiculopathy 1980: Multiple sclerosis (HCC) 11/22/2022: Neutropenia (HCC) No date: Osteoporosis No date: Pneumonia No date: Psoriasis No date: Pulmonary hypertension (HCC) No date: Sleep apnea No date: Ureteral stone No date: Vision abnormalities  Reproductive/Obstetrics                              Anesthesia Physical Anesthesia Plan  ASA: 3  Anesthesia Plan: General   Post-op Pain Management: Ofirmev IV (intra-op)*   Induction: Intravenous  PONV Risk Score and Plan: 4 or greater and Ondansetron, Dexamethasone and Midazolam  Airway Management Planned: Oral ETT and Video Laryngoscope Planned  Additional Equipment: None  Intra-op Plan:   Post-operative Plan: Extubation in OR  Informed Consent: I have reviewed the patients History and Physical, chart,  labs and discussed the procedure including the risks, benefits and alternatives for the proposed anesthesia with the patient or authorized representative who has indicated his/her understanding and acceptance.     Dental advisory given  Plan Discussed with: CRNA and Surgeon  Anesthesia Plan Comments: (Discussed risks of anesthesia with patient, including PONV, sore throat, lip/dental/eye damage. Rare risks discussed as well, such as cardiorespiratory and neurological sequelae, and allergic reactions. Discussed the role of CRNA in patient's perioperative care. Patient understands.)         Anesthesia Quick Evaluation

## 2023-04-24 NOTE — H&P (Signed)
04/24/23 1:01 PM   Ashlee Fields 04-07-1956 [id number]  CC: Bilateral ureteral stones, UTI  HPI: Comorbid 67 year old female who previously presented on 03/12/2023 with UTI symptoms and abdominal pain was found to have bilateral proximal ureteral stones as well as UTI.  Enterococcus UTI was treated with culture appropriate nitrofurantoin, and she underwent bilateral ureteral stent placement at that time.  Here today for definitive management with bilateral ureteroscopy, laser lithotripsy, stent change.  Currently taking Gemtesa for OAB symptoms with significant improvement.  She denies any chest pain, lightheadedness, shortness of breath.  PMH: Past Medical History:  Diagnosis Date   Anxiety    Arthritis    Bacteremia due to Streptococcus pneumoniae    COPD (chronic obstructive pulmonary disease) (HCC)    Depression    Diabetes mellitus without complication (HCC)    Fibrocystic breast disease    GERD (gastroesophageal reflux disease)    Hematuria    Hyperlipidemia    Hypertension    Hypothyroidism    Left hemiplegia (HCC)    Lumbar radiculopathy    Multiple sclerosis (HCC) 1980   Neutropenia (HCC) 11/22/2022   Osteoporosis    Pneumonia    Psoriasis    Pulmonary hypertension (HCC)    Sleep apnea    Ureteral stone    Vision abnormalities     Surgical History: Past Surgical History:  Procedure Laterality Date   CESAREAN SECTION  1986   COLONOSCOPY     CYSTOSCOPY WITH STENT PLACEMENT Bilateral 03/12/2023   Procedure: CYSTOSCOPY WITH STENT PLACEMENT;  Surgeon: Sondra Come, MD;  Location: [facility];  Service: Urology;  Laterality: Bilateral;   ESOPHAGOGASTRODUODENOSCOPY (EGD) WITH PROPOFOL N/A 11/20/2020   Procedure: ESOPHAGOGASTRODUODENOSCOPY (EGD) WITH PROPOFOL;  Surgeon: Regis Bill, MD;  Location: [facility];  Service: Endoscopy;  Laterality: N/A;   KNEE ARTHROSCOPY Left    REPLACEMENT TOTAL KNEE Left 2009   RIGHT HEART CATH Right 08/05/2017    Procedure: RIGHT HEART CATH;  Surgeon: Dalia Heading, MD;  Location: [facility];  Service: Cardiovascular;  Laterality: Right;   TOTAL KNEE ARTHROPLASTY Right 02/28/2020   Procedure: TOTAL KNEE ARTHROPLASTY;  Surgeon: Juanell Fairly, MD;  Location: [facility];  Service: Orthopedics;  Laterality: Right;     Family History: Family History  Problem Relation Age of Onset   Breast cancer Paternal Grandmother    Heart disease Mother    Kidney failure Father    Colon cancer Maternal Grandmother     Social History:  reports that she quit smoking about 6 years ago. Her smoking use included cigarettes. She has been exposed to tobacco smoke. She has never used smokeless tobacco. She reports that she does not drink alcohol and does not use drugs.  Physical Exam: BP 129/77   Pulse 76   Temp 97.8 F (36.6 C) (Temporal)   Resp 16   Ht 5\' 3"  (1.6 m)   Wt 81.6 kg   SpO2 100%   BMI 31.89 kg/m    Constitutional:  Alert and oriented, No acute distress. Cardiovascular: Regular rate and rhythm Respiratory: Clear to auscultation bilaterally GI: Abdomen is soft, nontender, nondistended, no abdominal masses  Assessment & Plan:   67 year old female who previously presented in April 2024 with bilateral proximal ureteral stones and UTI and underwent bilateral ureteral stent placement.  Here today for definitive management with bilateral ureteroscopy, laser lithotripsy, stent placement.  We specifically discussed the risks ureteroscopy including bleeding, infection/sepsis, stent related symptoms including flank pain/urgency/frequency/incontinence/dysuria, ureteral injury,  inability to access stone, or need for staged or additional procedures.   Bilateral ureteroscopy, laser lithotripsy, stent placement  Legrand Rams, MD 04/24/2023  [facility] [address], [address] [place], [place] [address] [phone]

## 2023-04-24 NOTE — Anesthesia Procedure Notes (Signed)
Procedure Name: Intubation Date/Time: 04/24/2023 1:38 PM  Performed by: Maryla Morrow., CRNAPre-anesthesia Checklist: Patient identified, Patient being monitored, Timeout performed, Emergency Drugs available and Suction available Patient Re-evaluated:Patient Re-evaluated prior to induction Oxygen Delivery Method: Circle system utilized Preoxygenation: Pre-oxygenation with 100% oxygen Induction Type: IV induction Ventilation: Mask ventilation without difficulty Laryngoscope Size: 3 and McGraph Grade View: Grade I Tube type: Oral Tube size: 7.0 mm Number of attempts: 1 Airway Equipment and Method: Stylet Placement Confirmation: ETT inserted through vocal cords under direct vision, positive ETCO2 and breath sounds checked- equal and bilateral Secured at: 21 cm Tube secured with: Tape Dental Injury: Teeth and Oropharynx as per pre-operative assessment

## 2023-04-24 NOTE — Transfer of Care (Signed)
Immediate Anesthesia Transfer of Care Note  Patient: Ashlee Fields  Procedure(s) Performed: CYSTOSCOPY/URETEROSCOPY/HOLMIUM LASER/STENT EXCHANGE AND RETROGRADE (Bilateral)  Patient Location: PACU  Anesthesia Type:General  Level of Consciousness: awake, drowsy, and patient cooperative  Airway & Oxygen Therapy: Patient Spontanous Breathing  Post-op Assessment: Report given to RN and Post -op Vital signs reviewed and stable  Post vital signs: stable  Last Vitals:  Vitals Value Taken Time  BP 127/70 04/24/23 1415  Temp    Pulse 61 04/24/23 1417  Resp 15 04/24/23 1417  SpO2 98 % 04/24/23 1417  Vitals shown include unvalidated device data.  Last Pain:  Vitals:   04/24/23 1155  TempSrc: Temporal         Complications: No notable events documented.

## 2023-04-24 NOTE — Anesthesia Postprocedure Evaluation (Signed)
Anesthesia Post Note  Patient: DAYZIA THEBERGE  Procedure(s) Performed: CYSTOSCOPY/URETEROSCOPY/HOLMIUM LASER/STENT EXCHANGE AND RETROGRADE (Bilateral)  Patient location during evaluation: PACU Anesthesia Type: General Level of consciousness: awake and alert Pain management: pain level controlled Vital Signs Assessment: post-procedure vital signs reviewed and stable Respiratory status: spontaneous breathing, nonlabored ventilation, respiratory function stable and patient connected to nasal cannula oxygen Cardiovascular status: blood pressure returned to baseline and stable Postop Assessment: no apparent nausea or vomiting Anesthetic complications: no  There were no known notable events for this encounter.   Last Vitals:  Vitals:   04/24/23 1445 04/24/23 1500  BP: 126/86 129/65  Pulse: 70 70  Resp: 14 17  Temp: (!) 36.3 C   SpO2: 94% 95%    Last Pain:  Vitals:   04/24/23 1445  TempSrc:   PainSc: 0-No pain                 Stephanie Coup

## 2023-04-24 NOTE — OR Nursing (Signed)
6 FR 24 CM LEFT URETER STENT REMOVED BY DR Richardo Hanks AND REPLACED WITH 6 FR 24 CM URETER STENT.

## 2023-04-24 NOTE — Op Note (Signed)
Date of procedure: 04/24/23  Preoperative diagnosis:  Left proximal ureteral stone Right proximal ureteral stone  Postoperative diagnosis:  Same  Procedure: Cystoscopy Left ureteroscopy, laser lithotripsy, left retrograde pyelogram with intraoperative interpretation, left ureteral stent placement Right ureteroscopy, laser lithotripsy, right retrograde pyelogram with intraoperative interpretation, right ureteral stent placement  Surgeon: Legrand Rams, MD  Anesthesia: General  Complications: None  Intraoperative findings:  Approximately 5 mm stones lodged at the UPJ bilaterally, both dusted and irrigated free, uncomplicated stent placement  EBL: Minimal  Specimens: None  Drains: Bilateral 6 French by 24 cm ureteral stents  Indication: Ashlee Fields is a 67 y.o. patient with bilateral proximal ureteral stones who previously presented with UTI and underwent bilateral ureteral stent placement, here today for definitive management with bilateral ureteroscopy.  After reviewing the management options for treatment, they elected to proceed with the above surgical procedure(s). We have discussed the potential benefits and risks of the procedure, side effects of the proposed treatment, the likelihood of the patient achieving the goals of the procedure, and any potential problems that might occur during the procedure or recuperation. Informed consent has been obtained.  Description of procedure:  The patient was taken to the operating room and general anesthesia was induced. SCDs were placed for DVT prophylaxis. The patient was placed in the dorsal lithotomy position, prepped and draped in the usual sterile fashion, and preoperative antibiotics(vancomycin and cipro) were administered. A preoperative time-out was performed.   A 21 French rigid cystoscope was used to intubate the urethra and thorough cystoscopy was performed, there were no suspicious lesions and ureteral orifices were  orthotopic bilaterally with stents in place.  A sensor wire was advanced along the left ureteral stent up to the kidney under fluoroscopic vision.  The stent was grasped and removed and a second safety sensor wire was added.  A digital single-channel flexible ureteroscope advanced easily over the wire to the proximal ureter and there was a 7 mm stone lodged at the UPJ.  A 200 m laser fiber and settings of 1.0 J and 10 Hz was used to methodically dust the stone.  Retrograde pyelogram showed no extravasation or filling defects.  Careful pullback ureteroscopy showed no residual fragments.  A 6 French by 24 cm ureteral stent was placed fluoroscopically, initially good curl in the upper pole but straightened out and bit of a shepherds hook in the upper pole after placement.  A sensor wire was then passed alongside the right ureteral stent into the kidney under fluoroscopic vision.  The right ureteral stent was grasped and removed, and a second safety sensor wire was added through the stent.  A digital single-channel flexible ureteroscope was advanced over the wire to the proximal ureter, and again a 5 mm yellow stone was noted to be lodged in the proximal ureter.  A 200 m laser fiber and settings of 1.0 J and 10 Hz was used to methodically dust the stone and all fragments were irrigated free.  Retrograde pyelogram showed no extravasation or filling defects.  Careful pullback ureteroscopy showed no residual fragments.  A 6 French by 24 cm ureteral stent was placed fluoroscopically with a curl in the upper pole as well as in the bladder.  The bladder was drained and this concluded our procedure.  Disposition: Stable to PACU  Plan: Stent removal in clinic in ~1 week  Legrand Rams, MD

## 2023-04-30 ENCOUNTER — Encounter: Payer: Self-pay | Admitting: Urology

## 2023-04-30 ENCOUNTER — Ambulatory Visit (INDEPENDENT_AMBULATORY_CARE_PROVIDER_SITE_OTHER): Payer: Medicare HMO | Admitting: Urology

## 2023-04-30 VITALS — BP 118/76 | HR 68 | Ht 63.0 in | Wt 180.0 lb

## 2023-04-30 DIAGNOSIS — Z87442 Personal history of urinary calculi: Secondary | ICD-10-CM

## 2023-04-30 DIAGNOSIS — N201 Calculus of ureter: Secondary | ICD-10-CM

## 2023-04-30 DIAGNOSIS — Z466 Encounter for fitting and adjustment of urinary device: Secondary | ICD-10-CM

## 2023-04-30 MED ORDER — NITROFURANTOIN MONOHYD MACRO 100 MG PO CAPS
200.0000 mg | ORAL_CAPSULE | Freq: Once | ORAL | Status: AC
Start: 2023-04-30 — End: 2023-04-30
  Administered 2023-04-30: 200 mg via ORAL

## 2023-04-30 NOTE — Progress Notes (Signed)
Cystoscopy Procedure Note:  Indication: Stent removal s/p 04/24/2023 bilateral ureteroscopy for 5 mm proximal ureteral stones  Nitrofurantoin given for prophylaxis  After informed consent and discussion of the procedure and its risks, Ashlee Fields was positioned and prepped in the standard fashion. Cystoscopy was performed with a flexible cystoscope. The stent was grasped with flexible graspers and removed in its entirety. The patient tolerated the procedure well.  Findings: Uncomplicated stent removal  Assessment and Plan: Continue Gemtesa for OAB symptoms RTC with PA for 8 weeks symptom check OAB  Sondra Come, MD 04/30/2023

## 2023-04-30 NOTE — Addendum Note (Signed)
Addended by: Frankey Shown on: 04/30/2023 12:11 PM   Modules accepted: Orders

## 2023-05-04 ENCOUNTER — Ambulatory Visit: Payer: Medicare HMO | Admitting: Urology

## 2023-05-18 ENCOUNTER — Encounter: Payer: Self-pay | Admitting: Urology

## 2023-06-09 ENCOUNTER — Encounter: Payer: Self-pay | Admitting: Internal Medicine

## 2023-06-10 ENCOUNTER — Ambulatory Visit
Admission: RE | Admit: 2023-06-10 | Discharge: 2023-06-10 | Disposition: A | Discharge status: Home / Self Care | Payer: Medicare HMO | Source: Ambulatory Visit | Attending: Internal Medicine | Admitting: Internal Medicine

## 2023-06-10 ENCOUNTER — Encounter
Admission: RE | Disposition: A | Discharge status: Home / Self Care | Payer: Self-pay | Source: Ambulatory Visit | Attending: Internal Medicine

## 2023-06-10 ENCOUNTER — Encounter: Payer: Self-pay | Admitting: Internal Medicine

## 2023-06-10 ENCOUNTER — Ambulatory Visit: Payer: Medicare HMO | Admitting: Urgent Care

## 2023-06-10 DIAGNOSIS — K640 First degree hemorrhoids: Secondary | ICD-10-CM | POA: Insufficient documentation

## 2023-06-10 DIAGNOSIS — K529 Noninfective gastroenteritis and colitis, unspecified: Secondary | ICD-10-CM | POA: Diagnosis not present

## 2023-06-10 DIAGNOSIS — R194 Change in bowel habit: Secondary | ICD-10-CM | POA: Insufficient documentation

## 2023-06-10 DIAGNOSIS — D128 Benign neoplasm of rectum: Secondary | ICD-10-CM | POA: Insufficient documentation

## 2023-06-10 HISTORY — PX: BIOPSY: SHX5522

## 2023-06-10 HISTORY — PX: COLONOSCOPY WITH PROPOFOL: SHX5780

## 2023-06-10 HISTORY — PX: POLYPECTOMY: SHX5525

## 2023-06-10 SURGERY — COLONOSCOPY WITH PROPOFOL
Anesthesia: General | Site: Rectum

## 2023-06-10 MED ORDER — PROPOFOL 10 MG/ML IV BOLUS
INTRAVENOUS | Status: DC | PRN
Start: 2023-06-10 — End: 2023-06-10
  Administered 2023-06-10: 50 mg via INTRAVENOUS

## 2023-06-10 MED ORDER — SODIUM CHLORIDE 0.9 % IV SOLN: INTRAVENOUS | Status: DC

## 2023-06-10 MED ORDER — PROPOFOL 500 MG/50ML IV EMUL
INTRAVENOUS | Status: DC | PRN
  Administered 2023-06-10: 150 ug/kg/min via INTRAVENOUS

## 2023-06-10 MED ORDER — PROPOFOL 10 MG/ML IV BOLUS
INTRAVENOUS | Status: AC
  Filled 2023-06-10: qty 40

## 2023-06-10 MED ORDER — ONDANSETRON HCL 4 MG/2ML IJ SOLN
INTRAMUSCULAR | Status: AC
  Filled 2023-06-10: qty 2

## 2023-06-10 MED ORDER — LIDOCAINE HCL (CARDIAC) PF 100 MG/5ML IV SOSY
PREFILLED_SYRINGE | INTRAVENOUS | Status: DC | PRN
  Administered 2023-06-10: 40 mg via INTRAVENOUS

## 2023-06-10 NOTE — Op Note (Signed)
[facility] Patient Name: Ashlee Fields Procedure Date: 06/10/2023 8:54 AM MRN: [id number] Account #: [id number] Date of Birth: 1955-12-30 Admit Type: Outpatient Age: 67 Room: [facility] ENDO ROOM 2 Gender: Female Note Status: Finalized Instrument Name: Prentice Docker [id number] Procedure:             Colonoscopy Indications:           Chronic diarrhea, Change in bowel habits Providers:             Boykin Nearing. Norma Fredrickson MD, MD Referring MD:          Duane Lope. Judithann Sheen, MD (Referring MD) Medicines:             Propofol per Anesthesia Complications:         No immediate complications. Procedure:             Pre-Anesthesia Assessment:                        - The risks and benefits of the procedure and the                         sedation options and risks were discussed with the                         patient. All questions were answered and informed                         consent was obtained.                        - Patient identification and proposed procedure were                         verified prior to the procedure by the nurse. The                         procedure was verified in the procedure room.                        - ASA Grade Assessment: III - A patient with severe                         systemic disease.                        - After reviewing the risks and benefits, the patient                         was deemed in satisfactory condition to undergo the                         procedure.                        After obtaining informed consent, the colonoscope was                         passed under direct vision. Throughout the procedure,  the patient's blood pressure, pulse, and oxygen                         saturations were monitored continuously. The                         Colonoscope was introduced through the anus and                         advanced to the the cecum, identified by  appendiceal                         orifice and ileocecal valve. The Colonoscope was                         introduced through the anus and advanced to the the                         cecum, identified by appendiceal orifice and ileocecal                         valve. The patient tolerated the procedure well. The                         quality of the bowel preparation was adequate. The                         colonoscopy was somewhat difficult due to significant                         looping. Successful completion of the procedure was                         aided by applying abdominal pressure. Findings:      The perianal and digital rectal examinations were normal. Pertinent       negatives include normal sphincter tone and no palpable rectal lesions.      Non-bleeding internal hemorrhoids were found during retroflexion. The       hemorrhoids were Grade I (internal hemorrhoids that do not prolapse).      A 15 mm polyp was found in the distal rectum. The polyp was       semi-pedunculated. The polyp was removed with a hot snare. Resection and       retrieval were complete. Estimated blood loss: none.      The exam was otherwise without abnormality.      Biopsies for histology were taken with a cold forceps from the random       colon for evaluation of microscopic colitis. Impression:            - Non-bleeding internal hemorrhoids.                        - One 15 mm polyp in the distal rectum, removed with a                         hot snare. Resected and retrieved.                        -  The examination was otherwise normal.                        - Biopsies were taken with a cold forceps from the                         random colon for evaluation of microscopic colitis. Recommendation:        - Patient has a contact number available for                         emergencies. The signs and symptoms of potential                         delayed complications were discussed with the  patient.                         Return to normal activities tomorrow. Written                         discharge instructions were provided to the patient.                        - Resume previous diet.                        - Continue present medications.                        - Await pathology results.                        - Repeat colonoscopy is recommended for surveillance.                         The colonoscopy date will be determined after                         pathology results from today's exam become available                         for review.                        - Return to nurse practitioner in 3 months.                        - Follow up with Ashlee Fields, GI Nurse                         Practioner, in office to discuss results and monitor                         progress.                        - Telephone GI office to schedule appointment.                        - The findings and recommendations were discussed with  the patient. Procedure Code(s):     --- Professional ---                        [phone], Colonoscopy, flexible; with removal of                         tumor(s), polyp(s), or other lesion(s) by snare                         technique                        45380, 59, Colonoscopy, flexible; with biopsy, single                         or multiple Diagnosis Code(s):     --- Professional ---                        R19.4, Change in bowel habit                        K52.9, Noninfective gastroenteritis and colitis,                         unspecified                        K64.0, First degree hemorrhoids                        D12.8, Benign neoplasm of rectum CPT copyright 2022 [employer]. All rights reserved. The codes documented in this report are preliminary and upon coder review may  be revised to meet current compliance requirements. [name] MD, MD 06/10/2023 9:46:06 AM This report has been  signed electronically. Number of Addenda: 0 Note Initiated On: 06/10/2023 8:54 AM Scope Withdrawal Time: 0 hours 6 minutes 10 seconds  Total Procedure Duration: 0 hours 15 minutes 33 seconds  Estimated Blood Loss:  Estimated blood loss was minimal.      [facility] [place]

## 2023-06-10 NOTE — Interval H&P Note (Signed)
History and Physical Interval Note:  06/10/2023 9:06 AM  Ashlee Fields  has presented today for surgery, with the diagnosis of Bowel Habit Changes.  The various methods of treatment have been discussed with the patient and family. After consideration of risks, benefits and other options for treatment, the patient has consented to  Procedure(s) with comments: COLONOSCOPY WITH PROPOFOL (N/A) - WHEELCHAIR as a surgical intervention.  The patient's history has been reviewed, patient examined, no change in status, stable for surgery.  I have reviewed the patient's chart and labs.  Questions were answered to the patient's satisfaction.     [place], [place]

## 2023-06-10 NOTE — H&P (Signed)
Outpatient short stay form Pre-procedure 06/10/2023 9:01 AM Ashlee Fields Ashlee Fields, M.D.  Primary Physician: Aram Beecham, M.D.  Reason for visit:  Bowel habit changes  History of present illness:  Patient with several months of loose stools, no bleeding. No abdominal pain or weight loss Negative cologuard (9/21).   Colonoscopy and EGD11/15/07 by Dr Henrene Hawking and both were noted to be normal exams.     Current Facility-Administered Medications:    0.9 %  sodium chloride infusion, , Intravenous, Continuous, Shaquasha Gerstel, Boykin Nearing, MD  Medications Prior to Admission  Medication Sig Dispense Refill Last Dose   acetaminophen (TYLENOL) 500 MG tablet Take 2 tablets (1,000 mg total) by mouth 3 (three) times daily. (Patient taking differently: Take 1,000 mg by mouth 2 (two) times daily.) 30 tablet 0 06/09/2023   amLODipine (NORVASC) 5 MG tablet Take 5 mg by mouth at bedtime.   06/09/2023   atorvastatin (LIPITOR) 80 MG tablet Take 80 mg by mouth at bedtime.   06/09/2023   baclofen (LIORESAL) 20 MG tablet Take 40 mg by mouth at bedtime.   06/09/2023   calcium-vitamin D (OSCAL WITH D) 500-200 MG-UNIT tablet Take 2 tablets by mouth at bedtime.   Past Week   cyanocobalamin (,VITAMIN B-12,) 1000 MCG/ML injection Inject 1,000 mcg into the muscle every 30 (thirty) days.    Past Week   denosumab (PROLIA) 60 MG/ML SOSY injection Inject 60 mg into the skin every 6 (six) months.   Past Month   diazepam (VALIUM) 2 MG tablet Take 2 mg by mouth at bedtime as needed for muscle spasms.   Past Week   FLUoxetine (PROZAC) 20 MG capsule Take 60 mg by mouth daily.   06/09/2023   hydrochlorothiazide (HYDRODIURIL) 25 MG tablet Take 25 mg by mouth in the morning.   06/09/2023   levothyroxine (SYNTHROID, LEVOTHROID) 50 MCG tablet Take 50 mcg by mouth daily before breakfast.   06/09/2023   LORazepam (ATIVAN) 1 MG tablet Take 0.5 mg by mouth once a week. Tuesday   06/09/2023   metFORMIN (GLUCOPHAGE-XR) 500 MG 24 hr tablet Take 1,500 mg by  mouth every evening.  3 Past Week   Multiple Vitamins-Minerals (MULTIVITAMIN WITH MINERALS) tablet Take 1 tablet by mouth daily.   Past Week   ondansetron (ZOFRAN-ODT) 4 MG disintegrating tablet Take 4 mg by mouth every 8 (eight) hours as needed for nausea or vomiting.   06/09/2023   pantoprazole (PROTONIX) 40 MG tablet Take 40 mg by mouth 2 (two) times daily.   06/09/2023   predniSONE (DELTASONE) 10 MG tablet Take 10 mg by mouth daily.   06/09/2023   valsartan-hydrochlorothiazide (DIOVAN-HCT) 320-12.5 MG tablet Take 1 tablet by mouth at bedtime.   06/09/2023   Vibegron (GEMTESA) 75 MG TABS Take 1 tablet (75 mg total) by mouth daily. 30 tablet 11 06/09/2023   albuterol (VENTOLIN HFA) 108 (90 Base) MCG/ACT inhaler Inhale 2 puffs into the lungs every 6 (six) hours as needed for wheezing or shortness of breath.    at prn   ARIPiprazole (ABILIFY) 5 MG tablet Take 5 mg by mouth daily. (Patient not taking: Reported on 06/10/2023)   Not Taking   ascorbic acid (VITAMIN C) 500 MG tablet Take 500 mg by mouth daily.      bisoprolol (ZEBETA) 10 MG tablet Take 10 mg by mouth at bedtime. (Patient not taking: Reported on 06/10/2023)   Not Taking   lisdexamfetamine (VYVANSE) 50 MG capsule Take 50 mg by mouth daily. (Patient not taking: Reported  on 06/10/2023)   Not Taking   phentermine (ADIPEX-P) 37.5 MG tablet Take 37.5 mg by mouth every morning. (Patient not taking: Reported on 06/10/2023)   Not Taking   Semaglutide,0.25 or 0.5MG /DOS, (OZEMPIC, 0.25 OR 0.5 MG/DOSE,) 2 MG/3ML SOPN Inject 0.5 mg into the skin every Wednesday.   05/27/2023     Allergies  Allergen Reactions   Penicillins Hives    Did it involve swelling of the face/tongue/throat, SOB, or low BP? No Did it involve sudden or severe rash/hives, skin peeling, or any reaction on the inside of your mouth or nose? Yes Did you need to seek medical attention at a hospital or doctor's office? Unknown When did it last happen?      20 + years If all above answers  are "NO", may proceed with cephalosporin use.     Other Hives    succinylsulfathiazole   Sulfa Antibiotics Hives, Nausea And Vomiting, Itching and Rash     Past Medical History:  Diagnosis Date   Anxiety    Arthritis    Bacteremia due to Streptococcus pneumoniae    COPD (chronic obstructive pulmonary disease) (HCC)    Depression    Diabetes mellitus without complication (HCC)    Fibrocystic breast disease    GERD (gastroesophageal reflux disease)    Hematuria    Hyperlipidemia    Hypertension    Hypothyroidism    Left hemiplegia (HCC)    Lumbar radiculopathy    Multiple sclerosis (HCC) 1980   Neutropenia (HCC) 11/22/2022   Osteoporosis    Pneumonia    Psoriasis    Pulmonary hypertension (HCC)    Sleep apnea    Ureteral stone    Vision abnormalities     Review of systems:  Otherwise negative.    Physical Exam  Gen: Alert, oriented. Appears stated age.  HEENT: [place]/AT. PERRLA. Lungs: CTA, no wheezes. CV: RR nl S1, S2. Abd: soft, benign, no masses. BS+ Ext: No edema. Pulses 2+    Planned procedures: Proceed with colonoscopy. The patient understands the nature of the planned procedure, indications, risks, alternatives and potential complications including but not limited to bleeding, infection, perforation, damage to internal organs and possible oversedation/side effects from anesthesia. The patient agrees and gives consent to proceed.  Please refer to procedure notes for findings, recommendations and patient disposition/instructions.     Carely Nappier Ashlee Fields, M.D. Gastroenterology 06/10/2023  9:01 AM

## 2023-06-10 NOTE — Anesthesia Postprocedure Evaluation (Signed)
Anesthesia Post Note  Patient: Ashlee Fields  Procedure(s) Performed: COLONOSCOPY WITH PROPOFOL POLYPECTOMY (Rectum) BIOPSY  Patient location during evaluation: Endoscopy Anesthesia Type: General Level of consciousness: awake and alert Pain management: pain level controlled Vital Signs Assessment: post-procedure vital signs reviewed and stable Respiratory status: spontaneous breathing, nonlabored ventilation, respiratory function stable and patient connected to nasal cannula oxygen Cardiovascular status: blood pressure returned to baseline and stable Postop Assessment: no apparent nausea or vomiting Anesthetic complications: no   No notable events documented.   Last Vitals:  Vitals:   06/10/23 0833 06/10/23 0946  BP: 135/62 112/68  Pulse: 74   Resp: 16   Temp: (!) 36 C (!) 35.7 C  SpO2: 94%     Last Pain:  Vitals:   06/10/23 0956  TempSrc:   PainSc: 0-No pain                 Corinda Gubler

## 2023-06-10 NOTE — Transfer of Care (Signed)
Immediate Anesthesia Transfer of Care Note  Patient: Ashlee Fields  Procedure(s) Performed: COLONOSCOPY WITH PROPOFOL POLYPECTOMY (Rectum) BIOPSY  Patient Location: PACU  Anesthesia Type:MAC  Level of Consciousness: awake  Airway & Oxygen Therapy: Patient Spontanous Breathing  Post-op Assessment: Report given to RN and Post -op Vital signs reviewed and stable  Post vital signs: Reviewed and stable  Last Vitals:  Vitals Value Taken Time  BP 112/68 06/10/23 0946  Temp 35.7 C 06/10/23 0946  Pulse 71 06/10/23 0947  Resp 15 06/10/23 0948  SpO2 95 % 06/10/23 0947  Vitals shown include unfiled device data.  Last Pain:  Vitals:   06/10/23 0946  TempSrc: Temporal  PainSc: 0-No pain         Complications: No notable events documented.

## 2023-06-10 NOTE — Anesthesia Preprocedure Evaluation (Signed)
Anesthesia Evaluation  Patient identified by MRN, date of birth, ID band Patient awake    Reviewed: Allergy & Precautions, NPO status , Patient's Chart, lab work & pertinent test results  History of Anesthesia Complications Negative for: history of anesthetic complications  Airway Mallampati: III  TM Distance: >3 FB Neck ROM: Full    Dental  (+) Teeth Intact   Pulmonary sleep apnea , COPD, Patient abstained from smoking.Not current smoker, former smoker   Pulmonary exam normal breath sounds clear to auscultation       Cardiovascular Exercise Tolerance: Poor METShypertension, pulmonary hypertension(-) CAD and (-) Past MI (-) dysrhythmias + Valvular Problems/Murmurs MR  Rhythm:Regular Rate:Normal - Systolic murmurs TTE 2024 (below) shows severe pulm HTN, even though cardiac cath 6 years prior showed no evidence of pulm HTN. Patient denies ever being told about this result, or being told of the need to follow up with cardiologist about this. Denies any syncope or dyspnea.   1. Left ventricular ejection fraction, by estimation, is 45 to 50%. The  left ventricle has mildly decreased function. The left ventricle  demonstrates regional wall motion abnormalities (hypokinesis of the basal  to mid septal wall). Left ventricular  diastolic parameters are indeterminate.   2. Right ventricular systolic function is normal. The right ventricular  size is normal. There is severely elevated pulmonary artery systolic  pressure. The estimated right ventricular systolic pressure is 60.2 mmHg.   3. The mitral valve is normal in structure,leaflets are mildly thickened  with mild calcification. Moderate mitral valve regurgitation. No evidence  of mitral stenosis.   4. Tricuspid valve regurgitation is moderate.   5. The aortic valve has an indeterminant number of cusps. There is mild  calcification of the aortic valve. Aortic valve regurgitation is not   visualized. Aortic valve sclerosis is present, with no evidence of aortic  valve stenosis.   6. The inferior vena cava is normal in size with greater than 50%  respiratory variability, suggesting right atrial pressure of 3 mmHg.     Neuro/Psych  PSYCHIATRIC DISORDERS Anxiety Depression    negative neurological ROS     GI/Hepatic ,GERD  Medicated and Controlled,,(+)     (-) substance abuse    Endo/Other  diabetesHypothyroidism  On GLP1 ozempic, held > 7 days. Denies GI symptoms today.  Renal/GU Renal diseasenegative Renal ROS     Musculoskeletal   Abdominal   Peds  Hematology   Anesthesia Other Findings Past Medical History: No date: Anxiety No date: Arthritis No date: Bacteremia due to Streptococcus pneumoniae No date: COPD (chronic obstructive pulmonary disease) (HCC) No date: Depression No date: Diabetes mellitus without complication (HCC) No date: Fibrocystic breast disease No date: GERD (gastroesophageal reflux disease) No date: Hematuria No date: Hyperlipidemia No date: Hypertension No date: Hypothyroidism No date: Left hemiplegia (HCC) No date: Lumbar radiculopathy 1980: Multiple sclerosis (HCC) 11/22/2022: Neutropenia (HCC) No date: Osteoporosis No date: Pneumonia No date: Psoriasis No date: Pulmonary hypertension (HCC) No date: Sleep apnea No date: Ureteral stone No date: Vision abnormalities  Reproductive/Obstetrics                              Anesthesia Physical Anesthesia Plan  ASA: 3  Anesthesia Plan: General   Post-op Pain Management: Minimal or no pain anticipated   Induction: Intravenous  PONV Risk Score and Plan: 2 and Propofol infusion, TIVA and Ondansetron  Airway Management Planned: Nasal Cannula  Additional Equipment: None  Intra-op Plan:   Post-operative Plan:   Informed Consent: I have reviewed the patients History and Physical, chart, labs and discussed the procedure including the risks,  benefits and alternatives for the proposed anesthesia with the patient or authorized representative who has indicated his/her understanding and acceptance.     Dental advisory given  Plan Discussed with: CRNA and Surgeon  Anesthesia Plan Comments: (Discussed risks of anesthesia with patient, including possibility of difficulty with spontaneous ventilation under anesthesia necessitating airway intervention, PONV, and rare risks such as cardiac or respiratory or neurological events, and allergic reactions. Discussed the role of CRNA in patient's perioperative care. Patient understands.)         Anesthesia Quick Evaluation

## 2023-06-11 ENCOUNTER — Encounter: Payer: Self-pay | Admitting: Internal Medicine

## 2023-06-23 ENCOUNTER — Ambulatory Visit: Payer: Medicare HMO | Admitting: Physician Assistant

## 2023-06-26 ENCOUNTER — Ambulatory Visit: Payer: Medicare HMO | Admitting: Physician Assistant

## 2023-07-02 ENCOUNTER — Ambulatory Visit: Payer: Medicare HMO | Admitting: Physician Assistant

## 2023-07-26 DEATH — deceased
